# Patient Record
Sex: Male | Born: 1935 | Race: White | Hispanic: No | Marital: Married | Smoking: Former smoker
Health system: Southern US, Community
[De-identification: ages and names within clinical notes are randomized; demographics above are authoritative.]

## PROBLEM LIST (undated history)

## (undated) DIAGNOSIS — K2970 Gastritis, unspecified, without bleeding: Secondary | ICD-10-CM

## (undated) DIAGNOSIS — K209 Esophagitis, unspecified without bleeding: Secondary | ICD-10-CM

## (undated) DIAGNOSIS — M109 Gout, unspecified: Secondary | ICD-10-CM

## (undated) DIAGNOSIS — I739 Peripheral vascular disease, unspecified: Secondary | ICD-10-CM

## (undated) DIAGNOSIS — I10 Essential (primary) hypertension: Secondary | ICD-10-CM

## (undated) DIAGNOSIS — M67919 Unspecified disorder of synovium and tendon, unspecified shoulder: Secondary | ICD-10-CM

## (undated) DIAGNOSIS — R109 Unspecified abdominal pain: Secondary | ICD-10-CM

## (undated) DIAGNOSIS — E119 Type 2 diabetes mellitus without complications: Secondary | ICD-10-CM

## (undated) DIAGNOSIS — K279 Peptic ulcer, site unspecified, unspecified as acute or chronic, without hemorrhage or perforation: Secondary | ICD-10-CM

## (undated) DIAGNOSIS — N529 Male erectile dysfunction, unspecified: Secondary | ICD-10-CM

## (undated) DIAGNOSIS — F528 Other sexual dysfunction not due to a substance or known physiological condition: Secondary | ICD-10-CM

## (undated) DIAGNOSIS — M719 Bursopathy, unspecified: Secondary | ICD-10-CM

## (undated) DIAGNOSIS — N200 Calculus of kidney: Secondary | ICD-10-CM

## (undated) DIAGNOSIS — K2210 Ulcer of esophagus without bleeding: Secondary | ICD-10-CM

## (undated) DIAGNOSIS — K269 Duodenal ulcer, unspecified as acute or chronic, without hemorrhage or perforation: Secondary | ICD-10-CM

## (undated) DIAGNOSIS — C801 Malignant (primary) neoplasm, unspecified: Secondary | ICD-10-CM

## (undated) DIAGNOSIS — J309 Allergic rhinitis, unspecified: Principal | ICD-10-CM

## (undated) DIAGNOSIS — E785 Hyperlipidemia, unspecified: Secondary | ICD-10-CM

## (undated) DIAGNOSIS — M1990 Unspecified osteoarthritis, unspecified site: Secondary | ICD-10-CM

## (undated) DIAGNOSIS — K219 Gastro-esophageal reflux disease without esophagitis: Secondary | ICD-10-CM

## (undated) HISTORY — DX: Peripheral vascular disease, unspecified: I73.9

## (undated) HISTORY — DX: Essential (primary) hypertension: I10

## (undated) HISTORY — DX: Type 2 diabetes mellitus without complications: E11.9

## (undated) HISTORY — DX: Unspecified abdominal pain: R10.9

## (undated) HISTORY — DX: Male erectile dysfunction, unspecified: N52.9

## (undated) HISTORY — PX: COLONOSCOPY: SHX174

## (undated) HISTORY — DX: Bursopathy, unspecified: M71.9

## (undated) HISTORY — DX: Gout, unspecified: M10.9

## (undated) HISTORY — PX: ROTATOR CUFF REPAIR: SHX139

## (undated) HISTORY — DX: Other sexual dysfunction not due to a substance or known physiological condition: F52.8

## (undated) HISTORY — DX: Calculus of kidney: N20.0

## (undated) HISTORY — PX: TONSILLECTOMY: SUR1361

## (undated) HISTORY — PX: CYSTOSCOPY: SUR368

## (undated) HISTORY — PX: OTHER SURGICAL HISTORY: SHX169

## (undated) HISTORY — DX: Allergic rhinitis, unspecified: J30.9

## (undated) HISTORY — DX: Hyperlipidemia, unspecified: E78.5

## (undated) HISTORY — DX: Unspecified disorder of synovium and tendon, unspecified shoulder: M67.919

---

## 1998-12-15 DIAGNOSIS — C4492 Squamous cell carcinoma of skin, unspecified: Secondary | ICD-10-CM

## 1998-12-15 HISTORY — DX: Squamous cell carcinoma of skin, unspecified: C44.92

## 2003-01-29 ENCOUNTER — Encounter: Payer: Self-pay | Admitting: Orthopaedic Surgery

## 2003-01-29 ENCOUNTER — Encounter: Admission: RE | Admit: 2003-01-29 | Discharge: 2003-01-29 | Payer: Self-pay | Admitting: Orthopaedic Surgery

## 2003-02-14 ENCOUNTER — Ambulatory Visit (HOSPITAL_BASED_OUTPATIENT_CLINIC_OR_DEPARTMENT_OTHER): Admission: RE | Admit: 2003-02-14 | Discharge: 2003-02-14 | Payer: Self-pay | Admitting: Orthopaedic Surgery

## 2004-09-28 ENCOUNTER — Ambulatory Visit: Payer: Self-pay | Admitting: Physical Medicine & Rehabilitation

## 2004-09-28 ENCOUNTER — Inpatient Hospital Stay (HOSPITAL_COMMUNITY): Admission: RE | Admit: 2004-09-28 | Discharge: 2004-10-02 | Payer: Self-pay | Admitting: Orthopaedic Surgery

## 2004-11-03 ENCOUNTER — Ambulatory Visit: Payer: Self-pay | Admitting: Internal Medicine

## 2004-11-23 ENCOUNTER — Ambulatory Visit: Payer: Self-pay | Admitting: Internal Medicine

## 2005-06-30 DIAGNOSIS — C4492 Squamous cell carcinoma of skin, unspecified: Secondary | ICD-10-CM

## 2005-06-30 HISTORY — DX: Squamous cell carcinoma of skin, unspecified: C44.92

## 2005-10-17 ENCOUNTER — Inpatient Hospital Stay (HOSPITAL_COMMUNITY): Admission: EM | Admit: 2005-10-17 | Discharge: 2005-10-23 | Payer: Self-pay | Admitting: Emergency Medicine

## 2005-10-17 ENCOUNTER — Ambulatory Visit: Payer: Self-pay | Admitting: Physical Medicine & Rehabilitation

## 2006-02-22 ENCOUNTER — Ambulatory Visit: Payer: Self-pay | Admitting: Internal Medicine

## 2006-03-09 DIAGNOSIS — C4491 Basal cell carcinoma of skin, unspecified: Secondary | ICD-10-CM

## 2006-03-09 HISTORY — DX: Basal cell carcinoma of skin, unspecified: C44.91

## 2006-03-23 ENCOUNTER — Ambulatory Visit: Payer: Self-pay | Admitting: Internal Medicine

## 2006-09-27 ENCOUNTER — Ambulatory Visit: Payer: Self-pay | Admitting: Internal Medicine

## 2007-08-09 ENCOUNTER — Encounter: Payer: Self-pay | Admitting: Internal Medicine

## 2007-08-09 ENCOUNTER — Ambulatory Visit: Payer: Self-pay | Admitting: Internal Medicine

## 2007-08-09 DIAGNOSIS — M109 Gout, unspecified: Secondary | ICD-10-CM

## 2007-08-09 DIAGNOSIS — E785 Hyperlipidemia, unspecified: Secondary | ICD-10-CM

## 2007-08-09 DIAGNOSIS — E119 Type 2 diabetes mellitus without complications: Secondary | ICD-10-CM | POA: Insufficient documentation

## 2007-08-09 DIAGNOSIS — I10 Essential (primary) hypertension: Secondary | ICD-10-CM | POA: Insufficient documentation

## 2007-08-09 HISTORY — DX: Hyperlipidemia, unspecified: E78.5

## 2007-08-09 HISTORY — DX: Type 2 diabetes mellitus without complications: E11.9

## 2007-08-09 HISTORY — DX: Essential (primary) hypertension: I10

## 2007-08-09 HISTORY — DX: Gout, unspecified: M10.9

## 2007-08-09 LAB — CONVERTED CEMR LAB
ALT: 16 units/L (ref 0–53)
AST: 16 units/L (ref 0–37)
BUN: 23 mg/dL (ref 6–23)
Basophils Absolute: 0.1 10*3/uL (ref 0.0–0.1)
Basophils Relative: 1 % (ref 0.0–1.0)
CO2: 30 meq/L (ref 19–32)
Calcium: 10 mg/dL (ref 8.4–10.5)
Chloride: 106 meq/L (ref 96–112)
Cholesterol: 190 mg/dL (ref 0–200)
Creatinine, Ser: 1.1 mg/dL (ref 0.4–1.5)
Direct LDL: 104.3 mg/dL
Eosinophils Absolute: 0.6 10*3/uL (ref 0.0–0.6)
Eosinophils Relative: 8.2 % — ABNORMAL HIGH (ref 0.0–5.0)
Ferritin: 308.7 ng/mL (ref 22.0–322.0)
GFR calc Af Amer: 85 mL/min
GFR calc non Af Amer: 71 mL/min
Glucose, Bld: 148 mg/dL — ABNORMAL HIGH (ref 70–99)
HCT: 40.1 % (ref 39.0–52.0)
HDL: 54.2 mg/dL (ref >39.0)
Hemoglobin: 13.6 g/dL (ref 13.0–17.0)
Hgb A1c MFr Bld: 7 % — ABNORMAL HIGH (ref 4.6–6.0)
Iron: 77 ug/dL (ref 42–165)
LDL Cholesterol: 94 mg/dL (ref 0–99)
Lymphocytes Relative: 22.2 % (ref 12.0–46.0)
MCHC: 34 g/dL (ref 30.0–36.0)
MCV: 91.3 fL (ref 78.0–100.0)
Monocytes Absolute: 0.7 10*3/uL (ref 0.2–0.7)
Monocytes Relative: 9.6 % (ref 3.0–11.0)
Neutro Abs: 4.4 10*3/uL (ref 1.4–7.7)
Neutrophils Relative %: 59 % (ref 43.0–77.0)
PSA: 0.54 ng/mL (ref 0.10–4.00)
Platelets: 247 10*3/uL (ref 150–400)
Potassium: 5.8 meq/L — ABNORMAL HIGH (ref 3.5–5.1)
RBC: 4.39 M/uL (ref 4.22–5.81)
RDW: 12.6 % (ref 11.5–14.6)
Sodium: 139 meq/L (ref 135–145)
Total CHOL/HDL Ratio: 3.5
Triglycerides: 209 mg/dL (ref 0–149)
VLDL: 42 mg/dL — ABNORMAL HIGH (ref 0–40)
WBC: 7.4 10*3/uL (ref 4.5–10.5)

## 2007-08-14 ENCOUNTER — Telehealth: Payer: Self-pay | Admitting: Internal Medicine

## 2007-08-30 ENCOUNTER — Ambulatory Visit: Payer: Self-pay | Admitting: Internal Medicine

## 2007-08-30 LAB — CONVERTED CEMR LAB
ALT: 21 units/L (ref 0–53)
AST: 18 units/L (ref 0–37)
Albumin: 3.7 g/dL (ref 3.5–5.2)
Alkaline Phosphatase: 72 units/L (ref 39–117)
Amylase: 96 units/L (ref 27–131)
Basophils Absolute: 0 10*3/uL (ref 0.0–0.1)
Basophils Relative: 0.5 % (ref 0.0–1.0)
Bilirubin, Direct: 0.1 mg/dL (ref 0.0–0.3)
Eosinophils Absolute: 0.5 10*3/uL (ref 0.0–0.6)
Eosinophils Relative: 4.7 % (ref 0.0–5.0)
HCT: 37.7 % — ABNORMAL LOW (ref 39.0–52.0)
Hemoglobin: 12.9 g/dL — ABNORMAL LOW (ref 13.0–17.0)
Lipase: 123 units/L — ABNORMAL HIGH (ref 11.0–59.0)
Lymphocytes Relative: 25.2 % (ref 12.0–46.0)
MCHC: 34.1 g/dL (ref 30.0–36.0)
MCV: 89.8 fL (ref 78.0–100.0)
Monocytes Absolute: 0.9 10*3/uL — ABNORMAL HIGH (ref 0.2–0.7)
Monocytes Relative: 9.6 % (ref 3.0–11.0)
Neutro Abs: 5.9 10*3/uL (ref 1.4–7.7)
Neutrophils Relative %: 60 % (ref 43.0–77.0)
Platelets: 302 10*3/uL (ref 150–400)
RBC: 4.2 M/uL — ABNORMAL LOW (ref 4.22–5.81)
RDW: 13.2 % (ref 11.5–14.6)
Total Bilirubin: 0.5 mg/dL (ref 0.3–1.2)
Total Protein: 6.8 g/dL (ref 6.0–8.3)
WBC: 9.8 10*3/uL (ref 4.5–10.5)

## 2007-08-31 ENCOUNTER — Ambulatory Visit (HOSPITAL_COMMUNITY): Admission: RE | Admit: 2007-08-31 | Discharge: 2007-08-31 | Payer: Self-pay | Admitting: Internal Medicine

## 2007-10-06 ENCOUNTER — Ambulatory Visit: Payer: Self-pay | Admitting: Internal Medicine

## 2008-01-22 ENCOUNTER — Encounter: Payer: Self-pay | Admitting: Internal Medicine

## 2008-04-21 ENCOUNTER — Encounter: Payer: Self-pay | Admitting: Internal Medicine

## 2008-04-29 ENCOUNTER — Encounter: Payer: Self-pay | Admitting: Internal Medicine

## 2008-05-09 ENCOUNTER — Encounter: Payer: Self-pay | Admitting: Internal Medicine

## 2008-06-17 ENCOUNTER — Encounter: Admission: RE | Admit: 2008-06-17 | Discharge: 2008-06-17 | Payer: Self-pay | Admitting: Orthopaedic Surgery

## 2008-08-11 ENCOUNTER — Ambulatory Visit: Payer: Self-pay | Admitting: Internal Medicine

## 2008-08-11 DIAGNOSIS — M719 Bursopathy, unspecified: Secondary | ICD-10-CM

## 2008-08-11 DIAGNOSIS — M67919 Unspecified disorder of synovium and tendon, unspecified shoulder: Secondary | ICD-10-CM

## 2008-08-11 HISTORY — DX: Unspecified disorder of synovium and tendon, unspecified shoulder: M67.919

## 2008-08-11 LAB — CONVERTED CEMR LAB
ALT: 14 units/L (ref 0–53)
AST: 17 units/L (ref 0–37)
Albumin: 4.1 g/dL (ref 3.5–5.2)
Alkaline Phosphatase: 68 units/L (ref 39–117)
BUN: 32 mg/dL — ABNORMAL HIGH (ref 6–23)
Bilirubin, Direct: 0.1 mg/dL (ref 0.0–0.3)
CO2: 28 meq/L (ref 19–32)
Calcium: 9.5 mg/dL (ref 8.4–10.5)
Chloride: 112 meq/L (ref 96–112)
Cholesterol: 153 mg/dL (ref 0–200)
Creatinine, Ser: 1.3 mg/dL (ref 0.4–1.5)
Direct LDL: 92.5 mg/dL
GFR calc Af Amer: 70 mL/min
GFR calc non Af Amer: 58 mL/min
Glucose, Bld: 144 mg/dL — ABNORMAL HIGH (ref 70–99)
HDL: 46.9 mg/dL (ref >39.0)
Hgb A1c MFr Bld: 7.1 % — ABNORMAL HIGH (ref 4.6–6.0)
PSA: 0.5 ng/mL (ref 0.10–4.00)
Potassium: 5.8 meq/L — ABNORMAL HIGH (ref 3.5–5.1)
Sodium: 141 meq/L (ref 135–145)
Total Bilirubin: 0.7 mg/dL (ref 0.3–1.2)
Total CHOL/HDL Ratio: 3.3
Total Protein: 7.2 g/dL (ref 6.0–8.3)
Triglycerides: 225 mg/dL (ref 0–149)
Uric Acid, Serum: 7 mg/dL (ref 4.0–7.8)
VLDL: 45 mg/dL — ABNORMAL HIGH (ref 0–40)

## 2008-08-12 ENCOUNTER — Encounter: Payer: Self-pay | Admitting: Internal Medicine

## 2008-08-13 ENCOUNTER — Telehealth: Payer: Self-pay | Admitting: Internal Medicine

## 2008-10-10 ENCOUNTER — Ambulatory Visit: Payer: Self-pay | Admitting: Internal Medicine

## 2008-11-11 ENCOUNTER — Encounter: Payer: Self-pay | Admitting: Internal Medicine

## 2009-01-02 ENCOUNTER — Encounter: Payer: Self-pay | Admitting: Internal Medicine

## 2009-02-10 ENCOUNTER — Ambulatory Visit: Payer: Self-pay | Admitting: Internal Medicine

## 2009-02-10 DIAGNOSIS — F528 Other sexual dysfunction not due to a substance or known physiological condition: Secondary | ICD-10-CM

## 2009-02-10 HISTORY — DX: Other sexual dysfunction not due to a substance or known physiological condition: F52.8

## 2009-02-10 LAB — CONVERTED CEMR LAB
BUN: 19 mg/dL (ref 6–23)
CO2: 28 meq/L (ref 19–32)
Calcium: 9.3 mg/dL (ref 8.4–10.5)
Chloride: 105 meq/L (ref 96–112)
Creatinine, Ser: 0.9 mg/dL (ref 0.4–1.5)
GFR calc Af Amer: 107 mL/min
GFR calc non Af Amer: 89 mL/min
Glucose, Bld: 113 mg/dL — ABNORMAL HIGH (ref 70–99)
Hgb A1c MFr Bld: 6.8 % — ABNORMAL HIGH (ref 4.6–6.0)
Potassium: 4.5 meq/L (ref 3.5–5.1)
Sodium: 139 meq/L (ref 135–145)
Testosterone: 244.98 ng/dL — ABNORMAL LOW (ref 350.00–890.00)

## 2009-02-11 ENCOUNTER — Telehealth: Payer: Self-pay | Admitting: Internal Medicine

## 2009-03-20 ENCOUNTER — Ambulatory Visit: Payer: Self-pay | Admitting: Internal Medicine

## 2009-03-23 ENCOUNTER — Ambulatory Visit: Payer: Self-pay | Admitting: Internal Medicine

## 2009-08-12 ENCOUNTER — Ambulatory Visit: Payer: Self-pay | Admitting: Internal Medicine

## 2009-08-12 ENCOUNTER — Telehealth: Payer: Self-pay | Admitting: Internal Medicine

## 2009-08-12 DIAGNOSIS — I739 Peripheral vascular disease, unspecified: Secondary | ICD-10-CM

## 2009-08-12 HISTORY — DX: Peripheral vascular disease, unspecified: I73.9

## 2009-08-12 LAB — CONVERTED CEMR LAB
ALT: 23 units/L (ref 0–53)
AST: 26 units/L (ref 0–37)
Albumin: 4.2 g/dL (ref 3.5–5.2)
Alkaline Phosphatase: 73 units/L (ref 39–117)
BUN: 29 mg/dL — ABNORMAL HIGH (ref 6–23)
Bilirubin, Direct: 0.1 mg/dL (ref 0.0–0.3)
CO2: 29 meq/L (ref 19–32)
Calcium: 9.8 mg/dL (ref 8.4–10.5)
Chloride: 103 meq/L (ref 96–112)
Cholesterol: 168 mg/dL (ref 0–200)
Creatinine, Ser: 1.3 mg/dL (ref 0.4–1.5)
GFR calc non Af Amer: 57.8 mL/min (ref >60)
Glucose, Bld: 127 mg/dL — ABNORMAL HIGH (ref 70–99)
HDL: 53.2 mg/dL (ref >39.00)
Hgb A1c MFr Bld: 6.9 % — ABNORMAL HIGH (ref 4.6–6.5)
LDL Cholesterol: 96 mg/dL (ref 0–99)
PSA: 0.66 ng/mL (ref 0.10–4.00)
Potassium: 6.2 meq/L (ref 3.5–5.1)
Sodium: 138 meq/L (ref 135–145)
Total Bilirubin: 1.1 mg/dL (ref 0.3–1.2)
Total CHOL/HDL Ratio: 3
Total Protein: 7.3 g/dL (ref 6.0–8.3)
Triglycerides: 95 mg/dL (ref 0.0–149.0)
Uric Acid, Serum: 8.8 mg/dL — ABNORMAL HIGH (ref 4.0–7.8)
VLDL: 19 mg/dL (ref 0.0–40.0)
Vit D, 25-Hydroxy: 27 ng/mL — ABNORMAL LOW (ref 30–89)

## 2009-08-18 ENCOUNTER — Ambulatory Visit: Payer: Self-pay

## 2009-08-18 ENCOUNTER — Encounter: Payer: Self-pay | Admitting: Internal Medicine

## 2009-08-24 ENCOUNTER — Encounter: Payer: Self-pay | Admitting: Internal Medicine

## 2009-10-01 ENCOUNTER — Telehealth: Payer: Self-pay | Admitting: Internal Medicine

## 2009-10-14 ENCOUNTER — Telehealth (INDEPENDENT_AMBULATORY_CARE_PROVIDER_SITE_OTHER): Payer: Self-pay | Admitting: *Deleted

## 2009-10-26 ENCOUNTER — Ambulatory Visit: Payer: Self-pay | Admitting: Internal Medicine

## 2009-10-26 ENCOUNTER — Ambulatory Visit: Payer: Self-pay | Admitting: Cardiology

## 2009-10-28 ENCOUNTER — Telehealth: Payer: Self-pay | Admitting: Internal Medicine

## 2009-11-19 ENCOUNTER — Ambulatory Visit: Payer: Self-pay | Admitting: Internal Medicine

## 2009-12-14 ENCOUNTER — Ambulatory Visit: Payer: Self-pay | Admitting: Internal Medicine

## 2009-12-18 ENCOUNTER — Telehealth: Payer: Self-pay | Admitting: Internal Medicine

## 2010-01-25 ENCOUNTER — Telehealth: Payer: Self-pay | Admitting: Internal Medicine

## 2010-01-25 ENCOUNTER — Encounter: Payer: Self-pay | Admitting: Internal Medicine

## 2010-01-26 ENCOUNTER — Telehealth: Payer: Self-pay | Admitting: Internal Medicine

## 2010-02-08 ENCOUNTER — Telehealth: Payer: Self-pay | Admitting: Internal Medicine

## 2010-02-15 DIAGNOSIS — C4491 Basal cell carcinoma of skin, unspecified: Secondary | ICD-10-CM

## 2010-02-15 HISTORY — DX: Basal cell carcinoma of skin, unspecified: C44.91

## 2010-03-10 ENCOUNTER — Telehealth: Payer: Self-pay | Admitting: Internal Medicine

## 2010-04-23 ENCOUNTER — Telehealth: Payer: Self-pay | Admitting: Internal Medicine

## 2010-04-28 ENCOUNTER — Ambulatory Visit: Payer: Self-pay | Admitting: Internal Medicine

## 2010-04-28 ENCOUNTER — Telehealth: Payer: Self-pay | Admitting: Internal Medicine

## 2010-04-29 LAB — CONVERTED CEMR LAB
ALT: 17 units/L (ref 0–53)
AST: 20 units/L (ref 0–37)
Albumin: 4.2 g/dL (ref 3.5–5.2)
Alkaline Phosphatase: 81 units/L (ref 39–117)
BUN: 26 mg/dL — ABNORMAL HIGH (ref 6–23)
Basophils Absolute: 0.1 10*3/uL (ref 0.0–0.1)
Basophils Relative: 0.5 % (ref 0.0–3.0)
Bilirubin, Direct: 0.1 mg/dL (ref 0.0–0.3)
CO2: 32 meq/L (ref 19–32)
Calcium: 9.4 mg/dL (ref 8.4–10.5)
Chloride: 97 meq/L (ref 96–112)
Cholesterol: 193 mg/dL (ref 0–200)
Creatinine, Ser: 1.4 mg/dL (ref 0.4–1.5)
Eosinophils Absolute: 1 10*3/uL — ABNORMAL HIGH (ref 0.0–0.7)
Eosinophils Relative: 8.3 % — ABNORMAL HIGH (ref 0.0–5.0)
GFR calc non Af Amer: 55.22 mL/min (ref >60)
Glucose, Bld: 124 mg/dL — ABNORMAL HIGH (ref 70–99)
HCT: 46.1 % (ref 39.0–52.0)
HDL: 53 mg/dL (ref >39.00)
Hemoglobin: 15.7 g/dL (ref 13.0–17.0)
Hgb A1c MFr Bld: 6.6 % — ABNORMAL HIGH (ref 4.6–6.5)
LDL Cholesterol: 103 mg/dL — ABNORMAL HIGH (ref 0–99)
Lymphocytes Relative: 14.1 % (ref 12.0–46.0)
Lymphs Abs: 1.7 10*3/uL (ref 0.7–4.0)
MCHC: 34 g/dL (ref 30.0–36.0)
MCV: 91.7 fL (ref 78.0–100.0)
Monocytes Absolute: 1.1 10*3/uL — ABNORMAL HIGH (ref 0.1–1.0)
Monocytes Relative: 9.4 % (ref 3.0–12.0)
Neutro Abs: 8.2 10*3/uL — ABNORMAL HIGH (ref 1.4–7.7)
Neutrophils Relative %: 67.7 % (ref 43.0–77.0)
Platelets: 249 10*3/uL (ref 150.0–400.0)
Potassium: 4.7 meq/L (ref 3.5–5.1)
RBC: 5.03 M/uL (ref 4.22–5.81)
RDW: 13.7 % (ref 11.5–14.6)
Sodium: 138 meq/L (ref 135–145)
Total Bilirubin: 0.7 mg/dL (ref 0.3–1.2)
Total CHOL/HDL Ratio: 4
Total Protein: 6.9 g/dL (ref 6.0–8.3)
Triglycerides: 185 mg/dL — ABNORMAL HIGH (ref 0.0–149.0)
Uric Acid, Serum: 9.7 mg/dL — ABNORMAL HIGH (ref 4.0–7.8)
VLDL: 37 mg/dL (ref 0.0–40.0)
WBC: 12 10*3/uL — ABNORMAL HIGH (ref 4.5–10.5)

## 2010-05-02 ENCOUNTER — Encounter: Payer: Self-pay | Admitting: Internal Medicine

## 2010-05-11 ENCOUNTER — Telehealth: Payer: Self-pay | Admitting: Internal Medicine

## 2010-05-19 ENCOUNTER — Telehealth: Payer: Self-pay | Admitting: Internal Medicine

## 2010-07-05 ENCOUNTER — Ambulatory Visit: Payer: Self-pay | Admitting: Internal Medicine

## 2010-07-05 DIAGNOSIS — N200 Calculus of kidney: Secondary | ICD-10-CM | POA: Insufficient documentation

## 2010-07-05 DIAGNOSIS — R109 Unspecified abdominal pain: Secondary | ICD-10-CM | POA: Insufficient documentation

## 2010-07-05 HISTORY — DX: Calculus of kidney: N20.0

## 2010-07-05 HISTORY — DX: Unspecified abdominal pain: R10.9

## 2010-07-05 LAB — CONVERTED CEMR LAB
Bilirubin Urine: NEGATIVE
Hemoglobin, Urine: NEGATIVE
Ketones, ur: NEGATIVE mg/dL
Nitrite: NEGATIVE
Specific Gravity, Urine: 1.02 (ref 1.000–1.030)
Total Protein, Urine: NEGATIVE mg/dL
Urine Glucose: NEGATIVE mg/dL
Urobilinogen, UA: 0.2 (ref 0.0–1.0)
pH: 5 (ref 5.0–8.0)

## 2010-07-22 ENCOUNTER — Telehealth: Payer: Self-pay | Admitting: Internal Medicine

## 2010-09-10 ENCOUNTER — Telehealth (INDEPENDENT_AMBULATORY_CARE_PROVIDER_SITE_OTHER): Payer: Self-pay | Admitting: *Deleted

## 2010-11-17 ENCOUNTER — Encounter
Admission: RE | Admit: 2010-11-17 | Discharge: 2010-11-17 | Payer: Self-pay | Source: Home / Self Care | Admitting: Orthopaedic Surgery

## 2010-11-18 ENCOUNTER — Encounter: Payer: Self-pay | Admitting: Internal Medicine

## 2011-01-11 NOTE — Progress Notes (Signed)
  Phone Note Refill Request Message from:  Fax from Pharmacy on March 10, 2010 1:46 PM  Refills Requested: Medication #1:  FUROSEMIDE 40 MG TABS 1 by mouth once daily.   Last Refilled: 02/03/2010 Initial call taken by: Rock Nephew CMA,  March 10, 2010 1:46 PM    Prescriptions: FUROSEMIDE 40 MG TABS (FUROSEMIDE) 1 by mouth once daily  #30 x 5   Entered by:   Rock Nephew CMA   Authorized by:   Jacques Navy MD   Signed by:   Rock Nephew CMA on 03/10/2010   Method used:   [facility]        [facility] [phone][facility] (retail)       [address]       [place], [place]  [address]       Ph: [phone]       Fax: [phone]   RxID:   [id number]

## 2011-01-11 NOTE — Progress Notes (Signed)
  Phone Note Refill Request Message from:  Fax from Pharmacy on December 18, 2009 9:09 AM  Refills Requested: Medication #1:  METFORMIN HCL 1000 MG  TABS two times a day Initial call taken by: Ami Bullins CMA,  December 18, 2009 9:09 AM    Prescriptions: METFORMIN HCL 1000 MG  TABS (METFORMIN HCL) two times a day  #60 Tablet x 5   Entered by:   Ami Bullins CMA   Authorized by:   Jacques Navy MD   Signed by:   Bill Salinas CMA on 12/18/2009   Method used:   [facility]        [id number] ([facility]       [address]       [place], [place]  [address]       Ph: [phone]       Fax: [phone]   RxID:   [id number]

## 2011-01-11 NOTE — Letter (Signed)
Summary: BP Lists from pt/[facility]  BP Lists from pt/[facility]   Imported By: [place] 01/26/2010 10:45:36  _____________________________________________________________________  External Attachment:    Type:   Image     Comment:   External Document

## 2011-01-11 NOTE — Progress Notes (Signed)
Summary: Test strip refill  Phone Note Refill Request Message from:  Pharmacy on January 26, 2010 2:33 PM  Refills Requested: Medication #1:  ONETOUCH ULTRA TEST   STRP Check blood sugar once a day Initial call taken by: Lucious Groves,  January 26, 2010 2:33 PM    Prescriptions: [place] TEST   STRP (GLUCOSE BLOOD) Check blood sugar once a day  #50 x 5   Entered by:   Lucious Groves   Authorized by:   Jacques Navy MD   Signed by:   Lucious Groves on 01/26/2010   Method used:   [facility]        [facility] [address] [phone]* (retail)       [address]       [place], [place]  [address]       Ph: [phone]       Fax: [phone]   RxID:   [id number]
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
## 2011-01-11 NOTE — Letter (Signed)
[name] [address] [place], [place]  [address] Phone: [phone]      May 03, 2010   [name] [address] [place], [place] [address]  RE:  LAB RESULTS  Dear  [name],  The following is an interpretation of your most recent lab tests.  Please take note of any instructions provided or changes to medications that have resulted from your lab work.  ELECTROLYTES:  Good - no changes needed  KIDNEY FUNCTION TESTS:  Good - no changes needed  LIVER FUNCTION TESTS:  Good - no changes needed  Health professionals look at cholesterol as more involved than just the total cholesterol. We consider the level of LDL (bad) cholesterol, HDL (good), cholesterol, and Triglycerides (Grease) in the blood.  1. Your LDL should be under 100, and the HDL should be over 45, if you have any vascular disease such as heart attack, angina, stroke, TIA (mini stroke), claudication (pain in the legs when you walk due to poor circulation),  Abdominal Aortic Aneurysm (AAA), diabetes or prediabetes.  2. Your LDL should be under 130 if you have any two of the following:     a. Smoke or chew tobacco,     b. High blood pressure (if you are on medication or over 140/90 without medication),     c. Male gender,    d. HDL below 40,    e. A male relative (father, brother, or son), who have had any vascular event          as described in #1. above under the age of 40, or a male relative (mother,       sister, or daughter) who had an event as described above under age 73. (An HDL over 60 will subtract one risk factor from the total, so if you have two items in # 2 above, but an HDL over 60, you then fall into category # 3 below).  3. Your LDL should be under 160 if you have any one of the above.  Triglycerides should be under 200 with the ideal being under 150.  For diabetes or pre-diabetes, the ideal HgbA1C should be under 6.0%.  If you fall into any of the above  categories, you should make a follow up appointment to discuss this with your physician.  LIPID PANEL:  Good - no changes needed Triglyceride: 185.0   Cholesterol: 193   LDL: 103   HDL: 53.00   Chol/HDL%:  4  DIABETIC STUDIES:  Good - no changes needed Blood Glucose: 124   HgbA1C: 6.6     CBC:  Good - no changes needed Uric acid level is high @ 9.7 putting you at risk for gout   Call or e-mail me if you have questions ([email or web address]).   Sincerely Yours,    Jacques Navy MD  Patient: [name] Note: All result statuses are Final unless otherwise noted.  Tests: (1) BMP (METABOL)   Sodium                    138 mEq/L                   135-145   Potassium                 4.7 mEq/L                   3.5-5.1   Chloride  97 mEq/L                    96-112   Carbon Dioxide            32 mEq/L                    19-32   Glucose              [H]  124 mg/dL                   16-10   BUN                  [H]  26 mg/dL                    9-60   Creatinine                1.4 mg/dL                   4.5-4.0   Calcium                   9.4 mg/dL                   9.8-11.9   GFR                       55.22 mL/min                >60  Tests: (2) Lipid Panel (LIPID)   Cholesterol               193 mg/dL                   1-478     ATP III Classification            Desirable:  < 200 mg/dL                    Borderline High:  200 - 239 mg/dL               High:  > = 240 mg/dL   Triglycerides        [H]  185.0 mg/dL                 2.9-562.1     Normal:  <150 mg/dL     Borderline High:  308 - 199 mg/dL   HDL                       65.78 mg/dL                 >46.96   VLDL Cholesterol          37.0 mg/dL                  2.9-52.8   LDL Cholesterol      [H]  413 mg/dL                   2-44  CHO/HDL Ratio:  CHD Risk                             4                    Men  Women     1/2 Average Risk     3.4          3.3     Average Risk          5.0           4.4     2X Average Risk          9.6          7.1     3X Average Risk          15.0          11.0                           Tests: (3) Hepatic/Liver Function Panel (HEPATIC)   Total Bilirubin           0.7 mg/dL                   5.2-8.4   Direct Bilirubin          0.1 mg/dL                   1.3-2.4   Alkaline Phosphatase      81 U/L                      39-117   AST                       20 U/L                      0-37   ALT                       17 U/L                      0-53   Total Protein             6.9 g/dL                    4.0-1.0   Albumin                   4.2 g/dL                    2.7-2.5  Tests: (4) Hemoglobin A1C (A1C)   Hemoglobin A1C       [H]  6.6 %                       4.6-6.5     Glycemic Control Guidelines for People with Diabetes:     Non Diabetic:  <6%     Goal of Therapy: <7%     Additional Action Suggested:  >8%   Tests: (5) Uric Acid (URIC)   Uric Acid            [H]  9.7 mg/dL                   3.6-6.4  Tests: (6) CBC Platelet w/Diff (CBCD)   White Cell Count     [H]  12.0 K/uL                   4.5-10.5   Red Cell Count            5.03 Mil/uL  4.22-5.81   Hemoglobin                15.7 g/dL                   64.4-03.4   Hematocrit                46.1 %                      39.0-52.0   MCV                       91.7 fl                     78.0-100.0   MCHC                      34.0 g/dL                   74.2-59.5   RDW                       13.7 %                      11.5-14.6   Platelet Count            249.0 K/uL                  150.0-400.0   Neutrophil %              67.7 %                      43.0-77.0   Lymphocyte %              14.1 %                      12.0-46.0   Monocyte %                9.4 %                       3.0-12.0   Eosinophils%         [H]  8.3 %                       0.0-5.0   Basophils %               0.5 %                       0.0-3.0   Neutrophill Absolute [H]  8.2 K/uL                     1.4-7.7   Lymphocyte Absolute       1.7 K/uL                    0.7-4.0   Monocyte Absolute    [H]  1.1 K/uL                    0.1-1.0  Eosinophils, Absolute                        [H]  1.0 K/uL  0.0-0.7   Basophils Absolute        0.1 K/uL                    0.0-0.1

## 2011-01-11 NOTE — Progress Notes (Signed)
  Phone Note Refill Request Message from:  Fax from Pharmacy on February 08, 2010 9:28 AM  Refills Requested: Medication #1:  FUROSEMIDE 40 MG TABS 1 by mouth once daily. Initial call taken by: Ami Bullins CMA,  February 08, 2010 9:28 AM    Prescriptions: FUROSEMIDE 40 MG TABS (FUROSEMIDE) 1 by mouth once daily  #30 x 2   Entered by:   Ami Bullins CMA   Authorized by:   Jacques Navy MD   Signed by:   Bill Salinas CMA on 02/08/2010   Method used:   [facility]        [facility] [address] [id number]       [address]       [place], [place]  [address]       Ph: [phone]       Fax: [phone]   RxID:   [id number]

## 2011-01-11 NOTE — Progress Notes (Signed)
Summary: MEDICATION REACTION  Phone Note Call from Patient   Summary of Call: Pt has been on labetalol for many years. Mid Feb, dose was increased to 2 three times a day. He has always had a "funny" feeling in his throat after taking the medication. Since the increase he c/o redness and tightness in his throat that gets progressively worse over the day. No sob or  difficulty breathing but does have trouble swallowing. Dr Debby Bud aware & advises pt to stop medication now and f/u this week. Pt is very worried that BP will become too elevated. He does not want to wait until tomorrow for office visit. Scheduled for apt today to discuss change in BP medication. Added Med to Allergy list and removed from EMR.  Initial call taken by: Lamar Sprinkles, CMA,  Apr 28, 2010 8:57 AM  Follow-up for Phone Call        seen in office today Follow-up by: Jacques Navy MD,  Apr 28, 2010 12:57 PM   New Allergies: ! LABETALOL HCL (LABETALOL HCL) New Allergies: ! LABETALOL HCL (LABETALOL HCL)

## 2011-01-11 NOTE — Progress Notes (Signed)
  Phone Note From Pharmacy   [id number] Reason for Call: Needs renewal Initial call taken by: Rock Nephew CMA,  July 22, 2010 9:42 AM    Prescriptions: TESTOSTERONE CYPIONATE 200 MG/ML OIL (TESTOSTERONE CYPIONATE) 2cc IM monthly for testosterone replacement  #10 cc x 3   Entered by:   Rock Nephew CMA   Authorized by:   Jacques Navy MD   Signed by:   Rock Nephew CMA on 07/22/2010   Method used:   Telephoned to ...       [facility] [address] [id number]       [address]       [place], [place]  [address]       Ph: [phone]       Fax: [phone]   RxID:   [id number]

## 2011-01-11 NOTE — Progress Notes (Signed)
Summary: BP MED PROBLEM  Phone Note Call from Patient Call back at [facility] Phone [phone]   Summary of Call: Pt recently d/c'd bp medication due to increase in dose causing increased throat tightness/swelling. He was changed from labetalol to amlodipine. Pt c/o stomach upset and feels like a "lump" in his throat. He remembers many years ago being put on Nexium due to same symptoms. BP is undercontrol and he wants to try nexium or something like that again.  Initial call taken by: Lamar Sprinkles, CMA,  May 19, 2010 10:02 AM  Follow-up for Phone Call         If still having symptoms - hold Lisinopril. Keep return office visit w/Dr Debby Bud OK to try Nexium Follow-up by: Tresa Garter MD,  May 19, 2010 5:40 PM  Additional Follow-up for Phone Call Additional follow up Details #1::        LMOVM for pt to call back.Marland KitchenMarland KitchenMarland KitchenAlvy Beal Archie CMA  May 20, 2010 10:44 AM   Spoke w/patient, he does NOT want to stop lisinopril. HE will stop med and call office  if nexium does not help or if symptoms change.  Additional Follow-up by: Lamar Sprinkles, CMA,  May 20, 2010 5:56 PM    New/Updated Medications: NEXIUM 40 MG CPDR (ESOMEPRAZOLE MAGNESIUM) 1 by mouth qam ( medically necessary) Prescriptions: NEXIUM 40 MG CPDR (ESOMEPRAZOLE MAGNESIUM) 1 by mouth qam ( medically necessary)  #30 x 3   Entered and Authorized by:   Tresa Garter MD   Signed by:   Jacques Navy MD on 05/19/2010   Method used:   [facility]ronically to        [facility] [address] [phone] ([facility]ail)       [address]       [place], [place]  [address]       Ph: [phone]       Fax: [phone]   RxID:   [id number]

## 2011-01-11 NOTE — Progress Notes (Signed)
Summary: REFILL  Phone Note Call from Patient   Summary of Call: Pt dropped of BP readings, per Dr continued present regimen. Pt aware. He says he is taking Labetalol 2 three times a day, Ok to update EMR and [facility]?  Initial call taken by: Lamar Sprinkles, CMA,  January 25, 2010 9:32 AM  Follow-up for Phone Call        changed med list and eScribed Rx: labetolol 300mg  2 caps three times a day = 180/month Follow-up by: Jacques Navy MD,  January 25, 2010 12:52 PM    New/Updated Medications: LABETALOL HCL 300 MG TABS (LABETALOL HCL) 2 by mouth three times a day Prescriptions: LABETALOL HCL 300 MG TABS (LABETALOL HCL) 2 by mouth three times a day  #180 x 12   Entered and Authorized by:   Jacques Navy MD   Signed by:   Jacques Navy MD on 01/25/2010   Method used:   Electronically to        [facility] [address] [phone]* (retail)       [address]       [place], [place]  [address]       Ph: [phone]       Fax: [phone]   RxID:   [id number]

## 2011-01-11 NOTE — Assessment & Plan Note (Signed)
Summary: elev bp 170/100/cd   Vital Signs:  Patient profile:   75 year old male Height:      78 inches Weight:      229 pounds BMI:     26.56 O2 Sat:      98 % on Room air Temp:     98.0 degrees F oral Pulse rate:   65 / minute BP sitting:   182 / 102  (left arm) Cuff size:   large  Vitals Entered By: Ami Bullins CMA (December 14, 2009 9:22 AM)  O2 Flow:  Room air CC: pt here for evaluation of elevated BP/ ab   CC:  pt here for evaluation of elevated BP/ ab.  Current Medications (verified): 1)  Labetalol Hcl 300 Mg Tabs (Labetalol Hcl) .Marland Kitchen.. 1 By Mouth Three Times A Day 2)  Metformin Hcl 1000 Mg  Tabs (Metformin Hcl) .... Two Times A Day 3)  Lipitor 20 Mg  Tabs (Atorvastatin Calcium) .... Take Every Pm 4)  Lisinopril 20 Mg  Tabs (Lisinopril) .... Once Daily 5)  Zantac 75 75 Mg  Tabs (Ranitidine Hcl) .... As Needed 6)  Onetouch Ultra Test   Strp (Glucose Blood) .... Check Blood Sugar Once A Day 7)  Testosterone Cypionate 200 Mg/ml Oil (Testosterone Cypionate) .... 2cc Im Monthly For Testosterone Replacement 8)  3 Ml Syringe With 22 G 1 Inch Needle .... Use Twice A Week With Testosterone 9)  Clonidine Hcl 0.1 Mg Tabs (Clonidine Hcl) .Marland Kitchen.. 1 By Mouth Q 4 As Needed Sbp >180, Dbp >100  Allergies (verified): No Known Drug Allergies   Complete Medication List: 1)  Labetalol Hcl 300 Mg Tabs (Labetalol hcl) .Marland Kitchen.. 1 by mouth three times a day 2)  Metformin Hcl 1000 Mg Tabs (Metformin hcl) .... Two times a day 3)  Lipitor 20 Mg Tabs (Atorvastatin calcium) .... Take every pm 4)  Lisinopril 20 Mg Tabs (Lisinopril) .... Once daily 5)  Zantac 75 75 Mg Tabs (Ranitidine hcl) .... As needed 6)  Onetouch Ultra Test Strp (Glucose blood) .... Check blood sugar once a day 7)  Testosterone Cypionate 200 Mg/ml Oil (Testosterone cypionate) .... 2cc im monthly for testosterone replacement 8)  3 Ml Syringe With 22 G 1 Inch Needle  .... Use twice a week with testosterone 9)  Clonidine Hcl 0.1 Mg Tabs  (Clonidine hcl) .Marland Kitchen.. 1 by mouth q 4 as needed sbp >180, dbp >100 10)  Furosemide 40 Mg Tabs (Furosemide) .Marland Kitchen.. 1 by mouth once daily Prescriptions: LABETALOL HCL 300 MG TABS (LABETALOL HCL) 1 by mouth three times a day  #90 x 12   Entered and Authorized by:   Jacques Navy MD   Signed by:   Jacques Navy MD on 12/14/2009   Method used:   Electronically to        [facility] [address] [phone]* (retail)       [address]       [place], [place]  [address]       Ph: [phone]       Fax: [phone]   RxID:   [id number] FUROSEMIDE 40 MG TABS (FUROSEMIDE) 1 by mouth once daily  #30 x 2   Entered and Authorized by:   Jacques Navy MD   Signed by:   Jacques Navy MD on 12/14/2009   Method used:   [facility]ronically to        [facility]  [address] [id number] ([facility]ail)       [address]       [place], [place]  [address]       Ph: [phone]       Fax: [phone]   RxID:   [id number]   Appended Document: elev bp 170/100/cd patient presents for contined problems with elevation of his BP. He has ruled out for RAS by CT-angio. He has no metabolic abnormality.  PHM/FH/SOC reviewed and without change  PE: vitals are stable        HEENT - no abnormality noted        Chest Clear        Cor-RRR without murmur  A/P  will continue present medications        add Furosemide 40mg  once daily        Monitor BP at home and report back.
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
## 2011-01-11 NOTE — Progress Notes (Signed)
Summary: Viagra refill  Phone Note From Pharmacy   Caller: [facility]  [address] [id number] Summary of Call: Received fax requesting refill for Viagra. Please advise. Initial call taken by: Lucious Groves,  Apr 23, 2010 4:25 PM  Follow-up for Phone Call        ok to refill #6 with as needed refills Follow-up by: Jacques Navy MD,  Apr 26, 2010 5:01 AM  Additional Follow-up for Phone Call Additional follow up Details #1::        [facility] Additional Follow-up by: Orlan Leavens,  Apr 26, 2010 9:19 AM    New/Updated Medications: VIAGRA 100 MG TABS (SILDENAFIL CITRATE) take as needed Prescriptions: VIAGRA 100 MG TABS (SILDENAFIL CITRATE) take as needed  #6 x 5   Entered by:   Orlan Leavens   Authorized by:   Jacques Navy MD   Signed by:   Orlan Leavens on 04/26/2010   Method used:   [facility]ronically to        [facility]  [address] [phone][facility] (retail)       [address]       [place], [place]  [address]       Ph: [phone]       Fax: [phone]   RxID:   [id number]

## 2011-01-11 NOTE — Progress Notes (Signed)
Summary: REQ FOR NEW RX  Phone Note Call from Patient Call back at Home Phone [phone]   Summary of Call: Pt says is BP medication is working "real good" but wants rx for increase to 10mg . OK?  Initial call taken by: Lamar Sprinkles, CMA,  May 11, 2010 10:34 AM  Follow-up for Phone Call        OK for amlodipine 10mg  once daily, #30, as needed refills Follow-up by: Jacques Navy MD,  May 11, 2010 10:44 AM  Additional Follow-up for Phone Call Additional follow up Details #1::        Pt's # busy................Marland KitchenLamar Sprinkles, CMA  May 11, 2010 2:34 PM   left mess for pt to check w/pharmacy Additional Follow-up by: Lamar Sprinkles, CMA,  May 11, 2010 3:55 PM    New/Updated Medications: AMLODIPINE BESYLATE 10 MG TABS (AMLODIPINE BESYLATE) 1 by mouth once daily Prescriptions: AMLODIPINE BESYLATE 10 MG TABS (AMLODIPINE BESYLATE) 1 by mouth once daily  #90 x 3   Entered by:   Lamar Sprinkles, CMA   Authorized by:   Jacques Navy MD   Signed by:   Lamar Sprinkles, CMA on 05/11/2010   Method used:   [facility]        [facility]  [address] [phone][facility] (retail)       [address]       [place], [place]  [address]       Ph: [phone]       Fax: [phone]   RxID:   [id number]

## 2011-01-11 NOTE — Progress Notes (Signed)
       Diabetes Management Exam:    Eye Exam:       Eye Exam done elsewhere          Date: 08/25/2010          Results: normal          Done by: Dr Jearld Adjutant

## 2011-01-27 NOTE — Letter (Signed)
Summary: Email from pt's daughter  Email from pt's daughter   Imported By: Sherian Rein 01/17/2011 08:45:18  _____________________________________________________________________  External Attachment:    Type:   Image     Comment:   External Document

## 2011-03-17 ENCOUNTER — Other Ambulatory Visit: Payer: Self-pay | Admitting: Internal Medicine

## 2011-03-21 ENCOUNTER — Telehealth: Payer: Self-pay | Admitting: *Deleted

## 2011-03-21 NOTE — Telephone Encounter (Signed)
Fax from [facility] [address] for Testosterone CYP 200 MG/ML QTY 10.0ML SIG inject 2cc IM monthly for testosterone replacement. Last filled 11/10/2010. Please Advise refills

## 2011-03-21 NOTE — Telephone Encounter (Signed)
Ok to refill 

## 2011-03-22 ENCOUNTER — Telehealth: Payer: Self-pay | Admitting: Internal Medicine

## 2011-03-22 MED ORDER — TESTOSTERONE CYPIONATE 200 MG/ML IM SOLN: 200.0000 mg | INTRAMUSCULAR | Status: DC

## 2011-03-22 NOTE — Telephone Encounter (Signed)
Ok for multi-dose  Vial as before. Refill x 1

## 2011-03-22 NOTE — Telephone Encounter (Signed)
Done

## 2011-03-22 NOTE — Telephone Encounter (Signed)
Pt needs new prescription for testosterone, pt would like faxed to [facility]. Please advise.

## 2011-04-06 ENCOUNTER — Other Ambulatory Visit: Payer: Self-pay | Admitting: Internal Medicine

## 2011-04-15 ENCOUNTER — Other Ambulatory Visit (INDEPENDENT_AMBULATORY_CARE_PROVIDER_SITE_OTHER): Payer: Medicare Other

## 2011-04-15 ENCOUNTER — Ambulatory Visit (INDEPENDENT_AMBULATORY_CARE_PROVIDER_SITE_OTHER): Payer: Medicare Other | Admitting: Internal Medicine

## 2011-04-15 ENCOUNTER — Encounter: Payer: Self-pay | Admitting: Internal Medicine

## 2011-04-15 ENCOUNTER — Other Ambulatory Visit (INDEPENDENT_AMBULATORY_CARE_PROVIDER_SITE_OTHER): Payer: Self-pay | Admitting: Internal Medicine

## 2011-04-15 VITALS — BP 136/78 | HR 86 | Temp 98.7°F | Wt 222.0 lb

## 2011-04-15 DIAGNOSIS — I10 Essential (primary) hypertension: Secondary | ICD-10-CM

## 2011-04-15 DIAGNOSIS — Z125 Encounter for screening for malignant neoplasm of prostate: Secondary | ICD-10-CM

## 2011-04-15 DIAGNOSIS — E119 Type 2 diabetes mellitus without complications: Secondary | ICD-10-CM

## 2011-04-15 DIAGNOSIS — E785 Hyperlipidemia, unspecified: Secondary | ICD-10-CM

## 2011-04-15 DIAGNOSIS — M109 Gout, unspecified: Secondary | ICD-10-CM

## 2011-04-15 DIAGNOSIS — Z136 Encounter for screening for cardiovascular disorders: Secondary | ICD-10-CM

## 2011-04-15 DIAGNOSIS — Z0000 Encounter for general adult medical examination without abnormal findings: Secondary | ICD-10-CM

## 2011-04-15 DIAGNOSIS — N529 Male erectile dysfunction, unspecified: Secondary | ICD-10-CM | POA: Insufficient documentation

## 2011-04-15 HISTORY — DX: Male erectile dysfunction, unspecified: N52.9

## 2011-04-15 LAB — COMPREHENSIVE METABOLIC PANEL
ALT: 13 U/L (ref 0–53)
AST: 18 U/L (ref 0–37)
Albumin: 4 g/dL (ref 3.5–5.2)
Alkaline Phosphatase: 78 U/L (ref 39–117)
Calcium: 9.3 mg/dL (ref 8.4–10.5)
Chloride: 100 mEq/L (ref 96–112)
Potassium: 4.7 mEq/L (ref 3.5–5.1)

## 2011-04-15 LAB — LIPID PANEL
HDL: 51.9 mg/dL (ref >39.00)
Total CHOL/HDL Ratio: 3
VLDL: 43.4 mg/dL — ABNORMAL HIGH (ref 0.0–40.0)

## 2011-04-15 LAB — PSA: PSA: 1.1 ng/mL (ref 0.10–4.00)

## 2011-04-15 LAB — LDL CHOLESTEROL, DIRECT: Direct LDL: 99.4 mg/dL

## 2011-04-15 LAB — URIC ACID: Uric Acid, Serum: 10.5 mg/dL — ABNORMAL HIGH (ref 4.0–7.8)
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
## 2011-04-29 NOTE — Op Note (Signed)
NAMESIM, CHOQUETTE NO.:  [id number]   MEDICAL RECORD NO.:  [id number]          PATIENT TYPE:  INP   LOCATION:  2899                         FACILITY:  [facility]   PHYSICIAN:  Claude Manges. Whitfield, M.D.DATE OF BIRTH:  1936-05-28   DATE OF PROCEDURE:  09/28/2004  DATE OF DISCHARGE:                                 OPERATIVE REPORT   PREOPERATIVE DIAGNOSIS:  End-stage osteoarthritis, left knee.   POSTOPERATIVE DIAGNOSIS:  End-stage osteoarthritis, left knee.   PROCEDURE:  Left total knee replacement.   SURGEON:  Claude Manges. Cleophas Dunker, M.D.   ASSISTANT:  Jamelle Rushing, P.A.-C.   ANESTHESIA:  General orotracheal with supplemental femoral nerve block.   COMPLICATIONS:  None.   COMPONENTS:  Dupuy LCS complete large plus femoral component, #6 keel  rotating tibial tray with a 15 mm bridging bearing and a metal-backed three-  peg patella.  All of this was secured with polymethyl methacrylate with 1.2  g of tobramycin per pack.   DESCRIPTION OF PROCEDURE:  With the patient comfortable on the operating  room table and under general orotracheal anesthesia, and a supplemental left  femoral nerve block, the left lower extremity was placed in a thigh  tourniquet.  The nursing staff inserted a Foley catheter.  The leg was then  prepped with Duraprep from the tourniquet to the mid-foot.  Sterile draping  was performed.  With the extremity still elevated, it was Esmarch  exsanguinated with the proximal tourniquet at 350 mmHg.  A midline  longitudinal incision was made, centered about the patella, extending from  the superior pouch to the tibial tubercle.  With very sharp dissection the  incision was carried down through the subcutaneous tissues through the first  layer of capsule.  A medial parapatellar incision was made with the Bovie.  The joint was entered with approximately 15 mL of clear yellow joint  effusion.  The patella was everted 180 degrees and the knee flexed to  90  degrees.  There were large osteophytes circumferentially about the patella,  and also along the lateral and medial femoral condyle.  There was a fixed  varus position.  A medial release was performed.  There was little if any  articular cartilage, particularly along the medial compartment, and to a  large extent along the lateral compartment.  Preoperatively we had measured either a large or a large plus femoral  component.  The large plus was confirmed intraoperatively.  We measured  either a #5 or #6 tibial tray.  The #6 tray was confirmed intraoperatively.  Initial cuts were then made transversely along the proximal tibia with the  seven degrees of declination.  There were large osteophytes on the medial  tibial plateau which were removed.  Subsequent cuts were then made on the  femur using the large plus templates.  Large osteophytes were again removed  from the medial and lateral femoral condyle.  Laminar spreaders were  inserted into the joint, and osteophytes removed from the posterior femoral  condyle medially and laterally.  The medial and lateral menisci were removed  with the Bovie.  The  ACL and PCL were sacrificed.  The MCL and LCL remained  intact throughout the operative procedure.  A retractor was then placed about the tibia.  We again confirmed the #6  tray.  A center cut was made, followed by the keel cut.  We used the 15 mm  flexion and extension gaps which were symmetrical throughout the procedure.  Accordingly we inserted a 15 mm bridging bearing, followed by the large plus  femoral component, with a full range of motion.  We had full extension, no  opening with varus or valgus stress.  No malrotation of the tibial  polyethylene component.  The patella was repaired by removing 13 mm of bone,  leaving approximately 18 mm of patella thickness.  The tri-pick template was  inserted.  The trial patella was then inserted.  Through a full range of  motion, there was no  subluxation.  The trial components were removed.  The  joint was copiously irrigated with saline solution with the jet saline.  The  final components were then inserted with polymethyl methacrylate.  We used  two packs of glue, and each were supplemented with 1.2 mg of tobramycin.  The trans-methacrylate was removed with the [facility].  The knee was placed in  extension.  The methacrylate matured and any extraneous hard methacrylate  was removed with an osteotome.  The joint was again irrigated with jet  saline.  The joint was inspected without evidence of loose material.  The  tourniquet was deflated.  Gross bleeders were Bovie-coagulated.  A Hemovac  was not necessary.  The deep capsule was closed with an interrupted #1  Ethibond.  The superficial capsule was closed with a running #0 Vicryl.  The  subcutaneous was closed with #2-0 Vicryl and the skin closed with skin  clips.  A sterile bulky dressing was applied, followed by the patient's  support stocking.  There were no complications.       PWW/MEDQ  D:  09/28/2004  T:  09/28/2004  Job:  [id number]

## 2011-04-29 NOTE — Op Note (Signed)
NAME:  CALLEN, VANCUREN              ACCOUNT NO.:  [id number]   MEDICAL RECORD NO.:  [id number]          PATIENT TYPE:  INP   LOCATION:  5002                         FACILITY:  [facility]   PHYSICIAN:  Doralee Albino. Carola Frost, M.D. DATE OF BIRTH:  [birth year]   DATE OF PROCEDURE:  10/20/2005  DATE OF DISCHARGE:                                 OPERATIVE REPORT   PREOPERATIVE DIAGNOSIS:  Right transverse posterior wall acetabular  fracture.   POSTOPERATIVE DIAGNOSIS:  Right transverse posterior wall acetabular  fracture.   PROCEDURE:  Open reduction and internal fixation, right transverse posterior  wall acetabular fracture.   SURGEON:  Doralee Albino. Carola Frost, M.D.   ASSISTANT:  Cecil Cranker, PA   ANESTHESIA:  General.   COMPLICATIONS:  None.   ESTIMATED BLOOD LOSS:  250 mL.   DISPOSITION:  To PACU.   CONDITION:  Stable.   BRIEF SUMMARY OF INDICATION FOR PROCEDURE:  Kaleel Schmieder is a 75 year old  white male who sustained a right posterior wall transverse acetabular  fracture-dislocation, treated with closed reduction by Dr. Cleophas Dunker and  subsequent CT scan which demonstrated marginal impaction, some intra-  articular fragments which are small, as well as a comminuted posterior wall  fracture and a transverse fracture.  We had a long discussion with the  patient and his daughter regarding the risks and benefits of surgery,  including the possibility of nerve injury, vessel injury, heterotopic bone,  dislocation, arthritis, avascular necrosis, need for further surgery,  thromboembolic complications, and the standard perioperative complications  such as heart attack and stroke.  After full discussion of his risks and  benefits, the patient wished to proceed.  Of note, he did have a transient  peroneal nerve paresthesia involving the deep peroneal branches without  motor loss.   BRIEF DESCRIPTION OF PROCEDURE:  Mr. Loewe was administered preoperative  antibiotics, taken to the  operating room, where his right lower extremity  was prepped and draped in the usual sterile fashion.  After being positioned  with the right side up using the hip positioners, a 15 cm incision centered  over the greater trochanter was made and the dissection carried down to the  tensor, which was split just posterior to the middle aspect of the greater  trochanter.  The Charnley retractor was placed.  There was extensive  hematoma in the medius musculature as well as the bursa.  The bursa was  excised and the posterior aspect of the greater trochanter palpated.  An  [employer] was placed under the medius, revealing the short rotators as well  as the piriformis insertion.  These were both robust tendons.  They were  divided near their insertion and captured with a tendon-grabbing suture  technique using #1 Vicryl.  The sciatic nerve was then palpated and  dissection continued down the ischium underneath the obturators, revealing  the transverse fracture.  The interval between minimus and the capsule was  then developed and capsular attachments protected and left intact to the  multiple wall fragments.  There was extensive amount of hematoma, which was  cleared away, and the bony surfaces on  both the wall fragments as well as  the pelvis were debrided with lavage and curettes.  A transverse fracture  was essentially nondisplaced.  The posterior wall was probably in four  different pieces, one of which was nondisplaced.  The femoral head was  examined and a trocar placed into the greater trochanter to allow  distraction through that Schanz pin and better visualization of the joint  articular surface.  Traction was pulled and lavage and inspection performed  of the joint.  The ligamentum had been torn and was partially protruding,  and this was truncated entirely and some small articular fragments also were  attached to this.  There were no major large fragments, and there was a  significant  amount of impaction right along the edge of the fracture  adjacent to the transverse component.  A half-inch and fourth-inch osteotome  were used to elevate this segment and push it onto the femoral head as a  template.  Crushed cancellous chips were pushed in behind this segment to  keep it elevated.  There was also a 1 x 1 cm segment that was completely  crushed into the pelvis well away from the articular surface.  This free  piece was pried up and also fit into the intact articular segment.  Similarly graft was placed behind this.  The wall fragments were then folded  down and provisionally fixed with lag screws.  Multiple images were obtained  showing appropriate placement and reduction.  The pieces were not fixed with  numerous lag screws because of the possibility of fragmentation.  Similarly  the posterior wall segments extended far down close to the posterior column,  making plate placement here difficult as well because of the limited space  available, particularly high on the column.  The wall fragment was then  buttressed with a Recon plate with fixation into the ilium and the supra-  acetabular region.  One of the fragmented portions of the posterior wall  then required additional fixation using a spring plate to provide additional  rotational control of this segment.  The posterior column was then plated  using an over-contoured plate to achieve some compression at that site as  well.  We did also investigate possible placement of an anterior column  screw; however, the starting point for this was obstructed by the most  proximal screws for the buttress plate as well as the spring plate.  Consequently, we did not insert any further fixation.  Final images were  obtained showing appropriate reduction and hardware placement.  The wound  was copiously irrigated and then closed in standard layered fashion, repairing the rotators to the bone tunnels, the tensor with a figure-of-   eight #1 Vicryl, the deep subcu with 0, the subcu with 2-0 and the skin with  staples.  A sterile gently compressive dressing was then applied.  We also  placed him into an abduction pillow.  He was taken to the PACU in stable  condition.   PROGNOSIS:  Mr. Laspina remains at increased risk for development of  arthritis.  He will also undergo radiation for heterotopic ossification  prophylaxis.  He will be on Coumadin for DVT prophylaxis.  He will be  touchdown weightbearing for the next eight weeks with gradually progressive  weightbearing thereafter.  He will also have strict hip precautions.      Doralee Albino. Carola Frost, M.D.  Electronically Signed     MHH/MEDQ  D:  10/23/2005  T:  10/24/2005  Job:  [id number]
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
## 2011-04-29 NOTE — Op Note (Signed)
NAME:  [name], [name]                          ACCOUNT NO.:  [id number]   MEDICAL RECORD NO.:  [id number]                   PATIENT TYPE:  AMB   LOCATION:  DSC                                  FACILITY:  [facility]   PHYSICIAN:  Claude Manges. Cleophas Dunker, M.D.            DATE OF BIRTH:  [birth year]   DATE OF PROCEDURE:  02/14/2003  DATE OF DISCHARGE:                                 OPERATIVE REPORT   PREOPERATIVE DIAGNOSIS:  1. Rotator cuff tear with impingement left shoulder.  2. Degenerative joint disease acromioclavicular joint.   POSTOPERATIVE DIAGNOSIS:  1. Rotator cuff tear with impingement left shoulder.  2. Degenerative joint disease acromioclavicular joint.  3. Tear of biceps tendon.   PROCEDURE:  1. Open rotator cuff tear repair with acromioplasty.  2. Repair of biceps tendon.  3. Resection of distal clavicle.   SURGEON:  Claude Manges. Cleophas Dunker, M.D.   ANESTHESIA:  General orotracheal.   COMPLICATIONS:  None.   INDICATIONS FOR PROCEDURE:  This 75 year old gentleman slipped and fell on  the ice a month ago with acute onset of left shoulder pain. He now has  difficulty with range of motion and sleeping at night as a result of his  pain and has had significant compromise of his activities.  He has  considerable degenerative change of the [facility] joint with clinical impingement.  MRI scan reveals considerable rotator cuff tear and now is to have open  exploration.   DESCRIPTION OF PROCEDURE:  The patient is comfortable on the operating table  and under general orotracheal anesthesia, the patient was placed in the  semisitting position on the shoulder frame. The left shoulder was then  prepped with Duraprep from the base of the neck circumferentially below the  elbow.  Sterile draping was performed.  The skin incision was outlined  beginning at the [facility] joint coursing along the anterior aspect of the  shoulder.  Via sharp dissection, the incision was carried down to the  subcutaneous  tissue. Small bleeders were Bovie coagulated. The [facility] joint was  identified and incised. There was considerable debris in the [facility] joint. This  was removed with a rongeur.  A distal clavicle resection was removed  excising approximately the distal 1 cm. Any further osteophytes were removed  with the rongeur, so that there was no further impingement beneath the  clavicle.  Bone wax was applied to the bleeding bone surface.   There was a large anterior acromial spur which was resected with the rongeur  and then an anterior inferior acromioplasty was performed such that there  was no further impingement.   There was a large rotator cuff tear with rounded edges, at least an inch in  length beginning along the supraspinatus tendon extending just beyond the  biceps tendon to include a portion of the subscapularis.  The biceps tendon  was frayed and then areas were torn. These were sharply debrided and then  I  repaired the tear with a running 2-0 Vicryl.  The edges of the rotator cuff  were then sharply debrided so there was good bleeding edge. The bone was  roughened and two Mitek anchors were inserted. I supplemented the repair  with 0 Vicryl with tendon to tendon repair.  I thought it was an excellent  repair. There was no tension or displacement with range of motion.  There  was no evidence of any further impingement. The wound was then irrigated  with saline solution. The deltoid fascia was reapproximated anatomically  followed by the [facility] joint capsule. The wound was again irrigated. The subcu  was closed with 2-0 Vicryl. The skin was closed with skin clips. A sterile  bulky dressing was applied followed by a sling. The patient had interscalene  block for postoperative pain control.   PLAN:  Percocet for pain.  Recovery care overnight. Office in one week.                                               Claude Manges. Cleophas Dunker, M.D.    PWW/MEDQ  D:  02/14/2003  T:  02/15/2003  Job:  [id number]
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
## 2011-04-29 NOTE — H&P (Signed)
NAME:  [name], [name]              ACCOUNT NO.:  [id number]   MEDICAL RECORD NO.:  [id number]          PATIENT TYPE:  INP   LOCATION:  NA                           FACILITY:  [facility]   PHYSICIAN:  Claude Manges. Whitfield, M.D.DATE OF BIRTH:  [birth year]   DATE OF ADMISSION:  09/28/2004  DATE OF DISCHARGE:                                HISTORY & PHYSICAL   CHIEF COMPLAINT:  Left knee pain.   HISTORY OF PRESENT ILLNESS:  [name] is a very pleasant 75 year old  white male with left knee pain.  The knee pain has been present for years;  however, over the past year the pain became considerably worse.  He has no  history of injury or trauma to the left knee.  Pain is now a constant dull  achy pain with some knife-like pains at times.  Pain does awaken him at  night.  Mechanical symptoms positive for catching, painful popping, and  giving away sensation.  Pain in the knee is worse with standing and also  going down stairs.  He occasionally uses a cane to ambulate.  Failed  conservative treatment that included cortisone injections and Hyalgan  injections.  He has undergone multiple aspirations of the knee due to  swelling and accumulation of joint fluid.  Left knee x-rays show end-stage  osteoarthritis of all three compartments.  Patient to undergo left total  knee arthroplasty by Dr. Cleophas Dunker on September 28, 2004, at [facility].   ALLERGIES:  No known drug allergies.   MEDICATIONS:  1.  Metformin 500 mg daily.  2.  Lipitor 10 mg daily.  3.  Labetalol 200 mg one daily.  4.  Cosamin daily.  5.  Chromium picolinate 500 mg daily.  6.  Over-the-counter Zantac p.r.n.   PAST MEDICAL HISTORY:  1.  Diabetes mellitus.  2.  Hypertension.  3.  Dyslipidemia.  4.  Occasional gastric reflux.  5.  History of kidney stones.   PAST SURGICAL HISTORY:  Shoulder arthroscopy with open rotator cuff repair  done by Dr. Cleophas Dunker in March 2004.   SOCIAL HISTORY:  The patient denies any  tobacco use.  He occasionally uses  alcohol.  He is married, has two grown children.  Primary care physician is  [name], M.D.  The patient lives in a one-story home with two  steps to the usual entrance.  He is a retired Visual merchandiser.   FAMILY HISTORY:  Mother deceased, age 78, due to ovarian cancer.  Father  deceased due to questionable aortic aneurysm.  He has two living brothers,  one with coronary artery disease at age 74, the other is healthy at age 25.   REVIEW OF SYSTEMS:  The patient wears glasses.  Denies any chest pain,  shortness of breath, PND, or orthopnea.  The remainder of review of systems  is negative.   PHYSICAL EXAMINATION:  GENERAL:  The patient is a well-developed, well-  nourished male.  Walks with a slight limp on the left.  The patient's mood  and affect are appropriate.  Talks easily with the examiner.  VITAL SIGNS:  The patient's height is 6 feet 5 inches, weight 236 pounds.  Vital signs:  Temperature is 98.7 degrees Fahrenheit, blood pressure is  158/100, pulse is 64, respiratory rate 14.  CARDIAC:  Regular rate and rhythm.  No murmurs, rubs, or gallops noted.  RESPIRATORY:  Lungs are clear to auscultation bilaterally.  No wheezing,  rales, or rhonchi noticed.  ABDOMEN:  Soft, nontender, no hepatomegaly, no splenomegaly.  Bowel sounds  x4 quadrants.  BACK:  No tenderness to palpation over the thoracic or lumbar spine.  BREASTS, GENITOURINARY, RECTAL:  All deferred at the present time.  NECK:  Trachea is midline without lymphadenopathy.  Carotids are 2+ without  bruits.  The patient has good range of motion of the cervical spine without  pain and no pain with palpation over the cervical column.  HEENT:  Head is normocephalic, atraumatic without frontal or maxillary sinus  tenderness to palpation.  Conjunctivae are pink.  Sclerae are nonicteric.  PERRLA, EOMs are intact.  No visible external ear deformities noted.  TMs  pearly and gray bilaterally.  Nose:   Nasal septum midline, nasal mucosa pink  and moist without polyps.  Buccal mucosa pink and moist.  Good dentition.  Pharynx without erythema or exudate.  Tongue and uvula midline.  NEUROLOGIC:  The patient is alert and oriented x3.  Cranial nerves II-XII  are grossly intact.  Deep tendon reflexes in the upper and lower extremities  are symmetric and equal throughout.  Upper and lower extremity strength  testing reveals 5/5 strength throughout.  MUSCULOSKELETAL:  Upper extremities are equal in size and shape throughout.  The patient has full range of motion of the shoulders, elbows, wrists, and  hands.  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  Radial pulses are 2+ bilaterally.  Lower extremities:  The  patient has full range of motion of both hips, flexion of 90 degrees  bilaterally.  Internal rotation of the left hip has caused some left knee  pain.  Left knee:  0-115 degrees of flexion.  No pain with palpation along  the joint line.  Valgus and varus stressing reveals no laxity.  Anterior  drawer is negative.  No evidence of effusion or edema in the left knee.  Right knee:  0-122 degrees flexion, no valgus or varus laxity.  Anterior  drawer is negative.  Palpation along the joint line reveals no tenderness.  There is no effusion or edema noted on exam.  Lower extremities nonedematous  bilaterally.  Dorsal pedal pulses 2+ bilaterally.  He does have decreased  sensation in both feet.   X-RAYS:  X-ray of the left knee shows end-stage osteoarthritis of all three  compartments.   IMPRESSION:  1.  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  2.  Osteoarthritis, right knee.  3.  Hypertension.  4.  Dyslipidemia.  5.  Occasional gastric reflux.  6.  History of kidney stones.  7.  Neuropathy of bilateral feet.   PLAN:  The patient is to be admitted to [facility] on September 28, 2004, and undergo a left total knee  arthroplasty by Dr. Cleophas Dunker.  Prior to  admission the patient is to see Dr. Debby Bud for his hypertension and make a  medicine adjustment if needed.  The patient will also undergo all  preoperative testing with labs prior to surgery.       GC/MEDQ  D:  09/16/2004  T:  09/16/2004  Job:  [id number] 

## 2011-04-29 NOTE — Discharge Summary (Signed)
NAME:  [name], [name]              ACCOUNT NO.:  [id number]   MEDICAL RECORD NO.:  [id number]          PATIENT TYPE:  INP   LOCATION:  5003                         FACILITY:  [facility]   PHYSICIAN:  Claude Manges. Whitfield, M.D.DATE OF BIRTH:  1936/04/18   DATE OF ADMISSION:  09/28/2004  DATE OF DISCHARGE:  10/02/2004                                 DISCHARGE SUMMARY   ADMISSION DIAGNOSES:  1.  End-stage osteoarthritis bilateral knees, left worse than right.  2.  Hypertension.  3.  Type 2 diabetes mellitus.  4.  Dyslipidemia.  5.  Mild gastroesophageal reflux disease.  6.  Neuropathy of bilateral feet.  7.  History of kidney stones.   DISCHARGE DIAGNOSES:  1.  End-stage osteoarthritis bilateral knees, left worse than right status      post left total knee arthroplasty.  2.  Acute blood loss anemia secondary to surgery.  3.  Type 2 diabetes mellitus.  4.  Hypertension.  5.  Dyslipidemia.  6.  Mild gastroesophageal reflux disease.  7.  Neuropathy of bilateral feet.  8.  History of kidney stones.   SURGICAL PROCEDURE:  On September 28, 2004, [name] underwent a left  total knee arthroplasty by Dr. Claude Manges. Cleophas Dunker, assisted by Arlyn Leak,  P.A.-C.  He had an LCS complete middle back patella cemented, size large  plus with an LCS complete primary femoral component, cemented size large  plus left, a DePuy M.B.T. keel tibial tray cemented size 6 and an LCS  complete RP insert large plus 15 mm thickness.   COMPLICATIONS:  None.   CONSULTATIONS:  1.  Pharmacy consult for Coumadin therapy and a case management consult      September 28, 2004.  2.  Physical therapy consult and rehabilitation medicine consult September 29, 2004.  3.  Occupational therapy consult September 30, 2004.   HISTORY OF PRESENT ILLNESS:  This 75 year old white male patient presented  to Dr. Cleophas Dunker with left knee pain for the last several years.  It is now  constant, dull, and achy and knifelike at times.   It awakens him at night.  It is worse with standing and going down stairs.  He complains of catching,  popping, and giving way.  He has failed conservative treatment and x-rays  show end-stage changes.  Because of this, he is presenting for a left knee  replacement.   HOSPITAL COURSE:  [name] tolerated his surgical procedure well without  immediate postoperative complications.  On postoperative day #1 he was  afebrile, vitals stable.  He was started on therapy per protocol.  On  postoperative day #2, he had a run of fever the night before, a temperature  max of 102.3.  He was complaining of feeling weak.  Hemoglobin was a bit  low.  This was monitored and he was started on iron.  He was continued with  physical therapy and switched to p.o. pain medications.  On postoperative  day #3, temperature max was 100.3 with 102.1 the night before.  Hemoglobin  had dropped to 8 with hematocrit of 22.8.  Because of that, he was  subsequently transfused with 2 units of packed red blood cells.  He did have  one episode of chest pain.  That was worked up and the workup was negative.  The next morning he was feeling much better.  Pain was well controlled,  afebrile, vitals stable.  Cardiac enzymes were negative.  It was felt he was  ready for discharge home and was discharged home later that day.   DISCHARGE INSTRUCTIONS:  Diet:  He can resume his regular pre-  hospitalization diet.   MEDICATIONS:  He may resume his home medications.  These included:  1.  Metformin 500 mg p.o. daily.  2.  Lipitor 10 mg p.o. daily.  3.  Labetalol 200 mg p.o. daily.  4.  Cosamin 1 tablet p.o. daily.  5.  Chromium picolinate 500 mg p.o. daily.  6.  Zantac over-the-counter as needed p.r.n.   ADDITIONAL MEDICATIONS AT THIS TIME:  1.  Coumadin to take as directed by pharmacy at 6 p.m. every evening for one      month.  2.  Percocet 5/325 mg 1-2 tablets p.o. q.4-6h. p.r.n. for pain.   ACTIVITY:  He can be out of  bed, partial weightbearing 50% or less on the  left leg with the use of a walker.  He is to have home CPM 0-100 degrees 6-8  hours a day.  Home health PT per [facility].  Please see the  blue total knee discharge sheet for further activity instructions.   WOUND CARE:  He may shower after no drainage from the wound for two days.  Please see the blue total knee discharge sheet for further wound care  instructions.   FOLLOWUP:  He needs to follow up with Dr. Cleophas Dunker in our office  approximately 10-12 days after discharge and needs to call [phone] for that  appointment.   LABORATORY DATA:  On 09/22/04, white count 11.7, hemoglobin 14.5, hematocrit  41.2, and platelets 264,000.  His white count subsequently dropped from then  from the admission point to 8.6 on October 02, 2004.  Hemoglobin ranged from  14.5 on admission to a low of 8 on October 01, 2004 to 8.9 on October 02, 2004.  Hematocrit went from 41.2 to a low of 22.8 on October 01, 2004 and  25.2 on October 02, 2004.  Platelets ranged from 264,000 on September 22, 2004  to 259,000 on October 02, 2004.   INR on admission was 0.9.  On October 02, 2004, PT was 14.4, INR 1.2.   On September 22, 2004, glucose was 146 and went to a high of 166 on September 29, 2004, and 157 on September 30, 2004.  Sodium ranged from 141 with  potassium of 4.9 on September 22, 2004 to 134 and 3.4 on September 30, 2004.  CK, CK-MB, and troponin on October 01, 2004 was all within normal limits.  All other laboratory studies were within normal limits.      Sherrilyn Rist   KED/MEDQ  D:  12/09/2004  T:  12/09/2004  Job:  [id number]   cc:   [name], M.D. [facility]

## 2011-05-10 ENCOUNTER — Other Ambulatory Visit: Payer: Self-pay | Admitting: Internal Medicine

## 2011-06-17 ENCOUNTER — Other Ambulatory Visit: Payer: Self-pay | Admitting: Internal Medicine

## 2011-09-07 ENCOUNTER — Other Ambulatory Visit: Payer: Self-pay | Admitting: Internal Medicine

## 2011-09-23 ENCOUNTER — Telehealth: Payer: Self-pay | Admitting: *Deleted

## 2011-09-23 MED ORDER — TESTOSTERONE CYPIONATE 200 MG/ML IM SOLN: 200.0000 mg | INTRAMUSCULAR | Status: DC

## 2011-09-23 NOTE — Telephone Encounter (Signed)
Ok for refill x2 

## 2011-09-23 NOTE — Telephone Encounter (Signed)
Rosanne Ashing (pharmacist) given verbal Rx refill.

## 2011-09-23 NOTE — Telephone Encounter (Signed)
Refill request for testosterone. Pts last ov was 04/15/2011, Please Advise refills

## 2011-09-26 ENCOUNTER — Other Ambulatory Visit: Payer: Self-pay | Admitting: Internal Medicine

## 2011-10-15 ENCOUNTER — Other Ambulatory Visit: Payer: Self-pay | Admitting: Internal Medicine

## 2011-11-12 ENCOUNTER — Other Ambulatory Visit: Payer: Self-pay | Admitting: Internal Medicine

## 2011-12-09 ENCOUNTER — Other Ambulatory Visit (INDEPENDENT_AMBULATORY_CARE_PROVIDER_SITE_OTHER): Payer: Medicare Other

## 2011-12-09 ENCOUNTER — Encounter: Payer: Self-pay | Admitting: Internal Medicine

## 2011-12-09 ENCOUNTER — Ambulatory Visit (INDEPENDENT_AMBULATORY_CARE_PROVIDER_SITE_OTHER): Payer: Medicare Other | Admitting: Internal Medicine

## 2011-12-09 VITALS — BP 118/68 | HR 93 | Temp 98.4°F

## 2011-12-09 DIAGNOSIS — I10 Essential (primary) hypertension: Secondary | ICD-10-CM

## 2011-12-09 DIAGNOSIS — R300 Dysuria: Secondary | ICD-10-CM

## 2011-12-09 DIAGNOSIS — M109 Gout, unspecified: Secondary | ICD-10-CM

## 2011-12-09 LAB — URINALYSIS
Bilirubin Urine: NEGATIVE
Leukocytes, UA: NEGATIVE
Specific Gravity, Urine: >=1.03 (ref 1.000–1.030)
Urobilinogen, UA: 0.2 (ref 0.0–1.0)

## 2011-12-09 MED ORDER — COLCHICINE 0.6 MG PO TABS: 0.6000 mg | ORAL_TABLET | Freq: Every day | ORAL | Status: DC

## 2011-12-09 NOTE — Progress Notes (Signed)
  Subjective:    Patient ID: [name], male    DOB: 1936/08/03, 75 y.o.   MRN: [id number]  HPI complains of gout flare in R elbow Chronic hx same in same location  Also dysuria, ?kidney infection  Also concern about variations in BP at home  Past Medical History  Diagnosis Date  . Gout   . Diabetes mellitus, type 2   . Hyperlipidemia   . Hypertension      Review of Systems  Constitutional: Negative for fever and fatigue.  Respiratory: Negative for cough and shortness of breath.   Cardiovascular: Negative for chest pain and palpitations.       Objective:   Physical Exam BP 118/68  Pulse 93  Temp(Src) 98.4 F (36.9 C) (Oral)  SpO2 98% Gen: NAD Mskel: swelling of R elbow - tophi present same location - Back: full range of motion of thoracic and lumbar spine. Non tender to palpation. Negative straight leg raise. DTR's are symmetrically intact. Sensation intact in all dermatomes of the lower extremities. Full strength to manual muscle testing. patient is able to heel toe walk without difficulty and ambulates with antalgic gait.   Lab Results  Component Value Date   WBC 12.0* 04/29/2010   HGB 15.7 04/29/2010   HCT 46.1 04/29/2010   PLT 249.0 04/29/2010   GLUCOSE 136* 04/15/2011   CHOL 181 04/15/2011   TRIG 217.0* 04/15/2011   HDL 51.90 04/15/2011   LDLDIRECT 99.4 04/15/2011   LDLCALC 103* 04/29/2010   ALT 13 04/15/2011   AST 18 04/15/2011   NA 139 04/15/2011   K 4.7 04/15/2011   CL 100 04/15/2011   CREATININE 1.5 04/15/2011   BUN 30* 04/15/2011   CO2 29 04/15/2011   TSH 1.05 04/15/2011   PSA 1.10 04/15/2011   HGBA1C 8.1* 04/15/2011        Assessment & Plan:  Gout - chronic in R elbow -  Start colchine - erx done  Dysuria - check UA - back exam benign  HTN - reassurance provided - no med changes today

## 2011-12-09 NOTE — Patient Instructions (Signed)
It was good to see you today. Start colchicine for gout flare - Your prescription(s) have been submitted to your pharmacy. Please take as directed and contact our office if you believe you are having problem(s) with the medication(s). Test(s) ordered today. Your results will be called to you after review (48-72hours after test completion). If any changes need to be made, you will be notified at that time. blood pressure looks ok today- no medication changes recommended Please schedule followup in 2 weeks with Dr Debby Bud, call sooner if problems.

## 2011-12-12 ENCOUNTER — Encounter: Payer: Self-pay | Admitting: Internal Medicine

## 2011-12-22 ENCOUNTER — Ambulatory Visit (INDEPENDENT_AMBULATORY_CARE_PROVIDER_SITE_OTHER): Payer: Medicare Other | Admitting: Internal Medicine

## 2011-12-22 VITALS — BP 136/88 | HR 75 | Temp 98.0°F | Wt 222.0 lb

## 2011-12-22 DIAGNOSIS — M109 Gout, unspecified: Secondary | ICD-10-CM

## 2011-12-22 DIAGNOSIS — Z23 Encounter for immunization: Secondary | ICD-10-CM | POA: Diagnosis not present

## 2011-12-22 DIAGNOSIS — E119 Type 2 diabetes mellitus without complications: Secondary | ICD-10-CM

## 2011-12-22 DIAGNOSIS — I10 Essential (primary) hypertension: Secondary | ICD-10-CM

## 2011-12-22 MED ORDER — ALLOPURINOL 300 MG PO TABS: 300.0000 mg | ORAL_TABLET | Freq: Every day | ORAL | Status: DC

## 2011-12-22 NOTE — Patient Instructions (Signed)
Blood pressure control - variable readings especially if dose is given on schedule  Plan - we need to change one drug at a time. First is to reduce the lasix to 20 mg once a day and continue the other medications. Let me know your readings and we can make continued adjustments - only changing one drug at a time.   Gout - will resume allopurinol.  Shingles vaccine today.

## 2011-12-25 ENCOUNTER — Encounter: Payer: Self-pay | Admitting: Internal Medicine

## 2011-12-25 NOTE — Assessment & Plan Note (Signed)
Lab Results  Component Value Date   HGBA1C 8.1* 04/15/2011   Will continue present regimen but increase dietary vigilance. Repeat A1C in 3 months, if not closer to goal will augment medication table.

## 2011-12-25 NOTE — Assessment & Plan Note (Signed)
BP Readings from Last 3 Encounters:  12/22/11 136/88  12/09/11 118/68  04/15/11 136/78   Home readings reviewed and do reflect a wide variation.  Plan decrease lasix to 20 mg once a day, continue amlodipine 10 and lisinopril 20         For continued difficulty in regulating BP will reduce amlodipine to 5 mg         He will monitor BP and report back.

## 2011-12-25 NOTE — Assessment & Plan Note (Signed)
He had stopped allopurinol and subsequently has had a couple of flares of acute gout.  Plan - resume allopurinol at 300 mg daily.

## 2011-12-25 NOTE — Progress Notes (Signed)
  Subjective:    Patient ID: [name], male    DOB: 1936-09-11, 76 y.o.   MRN: [id number]  HPI [name] is having trouble controlling his BP. If he takes his medication as scheduled he will develop hypotension. He has been spacing out his dosing, including getting up in the middle of the night to maintain good control. He has had no symptoms.  I have reviewed the patient's medical history in detail and updated the computerized patient record.    Review of Systems System review is negative for any constitutional, cardiac, pulmonary, GI or neuro symptoms or complaints other than as described in the HPI.      Objective:   Physical Exam Filed Vitals:   12/22/11 1136  BP: 136/88  Pulse: 75  Temp: 98 F (36.7 C)   Tall and lean white man in no distress HEENT- C&S clear Pulm- normal respirations Cor - RRR Neuro - A&O x 3, nl gait, nl strength       Assessment & Plan:

## 2011-12-30 ENCOUNTER — Telehealth: Payer: Self-pay | Admitting: *Deleted

## 2011-12-30 MED ORDER — LISINOPRIL 20 MG PO TABS: 20.0000 mg | ORAL_TABLET | Freq: Every day | ORAL | Status: DC

## 2011-12-30 NOTE — Telephone Encounter (Signed)
Refill request lisinopril 20mg .

## 2012-02-25 ENCOUNTER — Other Ambulatory Visit: Payer: Self-pay | Admitting: Internal Medicine

## 2012-02-27 NOTE — Telephone Encounter (Signed)
Viagra request [last refill 11.03.12 #[id number]]

## 2012-05-04 ENCOUNTER — Other Ambulatory Visit: Payer: Self-pay | Admitting: Internal Medicine

## 2012-06-07 ENCOUNTER — Ambulatory Visit (INDEPENDENT_AMBULATORY_CARE_PROVIDER_SITE_OTHER): Payer: Medicare Other | Admitting: Internal Medicine

## 2012-06-07 ENCOUNTER — Encounter: Payer: Self-pay | Admitting: Internal Medicine

## 2012-06-07 ENCOUNTER — Other Ambulatory Visit (INDEPENDENT_AMBULATORY_CARE_PROVIDER_SITE_OTHER): Payer: Medicare Other

## 2012-06-07 VITALS — BP 130/80 | HR 100 | Temp 97.1°F | Resp 16 | Ht 78.0 in | Wt 218.0 lb

## 2012-06-07 DIAGNOSIS — E785 Hyperlipidemia, unspecified: Secondary | ICD-10-CM

## 2012-06-07 DIAGNOSIS — Z0000 Encounter for general adult medical examination without abnormal findings: Secondary | ICD-10-CM | POA: Diagnosis not present

## 2012-06-07 DIAGNOSIS — M109 Gout, unspecified: Secondary | ICD-10-CM | POA: Diagnosis not present

## 2012-06-07 DIAGNOSIS — E119 Type 2 diabetes mellitus without complications: Secondary | ICD-10-CM

## 2012-06-07 DIAGNOSIS — N200 Calculus of kidney: Secondary | ICD-10-CM

## 2012-06-07 DIAGNOSIS — I10 Essential (primary) hypertension: Secondary | ICD-10-CM | POA: Diagnosis not present

## 2012-06-07 DIAGNOSIS — K4190 Unilateral femoral hernia, without obstruction or gangrene, not specified as recurrent: Secondary | ICD-10-CM

## 2012-06-07 DIAGNOSIS — Z1211 Encounter for screening for malignant neoplasm of colon: Secondary | ICD-10-CM

## 2012-06-07 LAB — URINALYSIS, ROUTINE W REFLEX MICROSCOPIC
Ketones, ur: NEGATIVE
Leukocytes, UA: NEGATIVE
Nitrite: NEGATIVE
Specific Gravity, Urine: 1.01 (ref 1.000–1.030)
Urobilinogen, UA: 0.2 (ref 0.0–1.0)

## 2012-06-07 LAB — COMPREHENSIVE METABOLIC PANEL
Albumin: 3.8 g/dL (ref 3.5–5.2)
BUN: 24 mg/dL — ABNORMAL HIGH (ref 6–23)
CO2: 27 mEq/L (ref 19–32)
Calcium: 9.4 mg/dL (ref 8.4–10.5)
Chloride: 106 mEq/L (ref 96–112)
GFR: 58.38 mL/min — ABNORMAL LOW (ref >60.00)
Glucose, Bld: 137 mg/dL — ABNORMAL HIGH (ref 70–99)
Potassium: 4.7 mEq/L (ref 3.5–5.1)
Sodium: 139 mEq/L (ref 135–145)
Total Protein: 7.3 g/dL (ref 6.0–8.3)

## 2012-06-07 LAB — HEPATIC FUNCTION PANEL
ALT: 12 U/L (ref 0–53)
Bilirubin, Direct: 0 mg/dL (ref 0.0–0.3)
Total Bilirubin: 0.6 mg/dL (ref 0.3–1.2)

## 2012-06-07 LAB — HEMOGLOBIN A1C: Hgb A1c MFr Bld: 7.6 % — ABNORMAL HIGH (ref 4.6–6.5)

## 2012-06-07 LAB — URIC ACID: Uric Acid, Serum: 8.1 mg/dL — ABNORMAL HIGH (ref 4.0–7.8)

## 2012-06-07 LAB — LDL CHOLESTEROL, DIRECT: Direct LDL: 138.8 mg/dL
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
## 2012-06-10 ENCOUNTER — Encounter: Payer: Self-pay | Admitting: Internal Medicine

## 2012-06-10 DIAGNOSIS — Z0001 Encounter for general adult medical examination with abnormal findings: Secondary | ICD-10-CM | POA: Insufficient documentation

## 2012-06-10 DIAGNOSIS — K4190 Unilateral femoral hernia, without obstruction or gangrene, not specified as recurrent: Secondary | ICD-10-CM | POA: Insufficient documentation

## 2012-06-10 DIAGNOSIS — Z0000 Encounter for general adult medical examination without abnormal findings: Secondary | ICD-10-CM | POA: Insufficient documentation

## 2012-06-10 MED ORDER — ATORVASTATIN CALCIUM 40 MG PO TABS: 40.0000 mg | ORAL_TABLET | Freq: Every day | ORAL | Status: DC

## 2012-06-10 NOTE — Assessment & Plan Note (Signed)
BP Readings from Last 3 Encounters:  06/07/12 130/80  12/22/11 136/88  12/09/11 118/68   adequate control on present medications

## 2012-06-10 NOTE — Assessment & Plan Note (Signed)
Intermittent  Bulge in the right femoral triangle c/w small femoral hernia.  Plan Watchful waiting - for increased size, pain or irreducibility - surgical consult.

## 2012-06-10 NOTE — Assessment & Plan Note (Signed)
No recent flares. Uric acid level 8.1 down from 10.8.  Plan Without more than two flares per year will not add allopurinol.  Low purine diet - patient directed to [email or web address] for detailed information

## 2012-06-10 NOTE — Assessment & Plan Note (Signed)
Having minor flank pain reminiscent of previous kidney stone discomfort.  Plan U/A - if positive for blood - CT kidney stone protocol.  Addendum - U/A negative.

## 2012-06-10 NOTE — Assessment & Plan Note (Signed)
LDL May '12 was at goal of less than 100 for a diabetic, now 139.  Plan Low fat diet  Increase lipitor to 40 mg daily  Repeat lipid panel in 6 months

## 2012-06-10 NOTE — Assessment & Plan Note (Signed)
Lab Results  Component Value Date   HGBA1C 7.6* 06/07/2012   Running higher than goal but below the threshold for changing therapy.   Plan Continue metformin at present dose   Better adherence to no sugar low carb diet.  Repeat A1C 6 months (order entered)

## 2012-06-15 ENCOUNTER — Ambulatory Visit (AMBULATORY_SURGERY_CENTER): Payer: Medicare Other | Admitting: *Deleted

## 2012-06-15 VITALS — Ht 78.0 in | Wt 220.2 lb

## 2012-06-15 DIAGNOSIS — Z1211 Encounter for screening for malignant neoplasm of colon: Secondary | ICD-10-CM

## 2012-06-15 MED ORDER — MOVIPREP 100 G PO SOLR: ORAL | Status: DC

## 2012-06-15 NOTE — Progress Notes (Signed)
No allergies to eggs or soy products 

## 2012-06-19 NOTE — Addendum Note (Signed)
Addended by: Maple Hudson on: 06/19/2012 04:04 PM   Modules accepted: Level of Service

## 2012-06-22 ENCOUNTER — Encounter: Payer: Self-pay | Admitting: Gastroenterology

## 2012-06-22 ENCOUNTER — Ambulatory Visit (AMBULATORY_SURGERY_CENTER): Payer: Medicare Other | Admitting: Gastroenterology

## 2012-06-22 VITALS — BP 137/73 | HR 68 | Temp 96.7°F | Resp 18 | Ht 78.0 in | Wt 220.0 lb

## 2012-06-22 DIAGNOSIS — K635 Polyp of colon: Secondary | ICD-10-CM

## 2012-06-22 DIAGNOSIS — D126 Benign neoplasm of colon, unspecified: Secondary | ICD-10-CM

## 2012-06-22 DIAGNOSIS — Z1211 Encounter for screening for malignant neoplasm of colon: Secondary | ICD-10-CM | POA: Diagnosis not present

## 2012-06-22 DIAGNOSIS — E785 Hyperlipidemia, unspecified: Secondary | ICD-10-CM | POA: Diagnosis not present

## 2012-06-22 DIAGNOSIS — E119 Type 2 diabetes mellitus without complications: Secondary | ICD-10-CM | POA: Diagnosis not present

## 2012-06-22 DIAGNOSIS — I10 Essential (primary) hypertension: Secondary | ICD-10-CM | POA: Diagnosis not present

## 2012-06-22 MED ORDER — SODIUM CHLORIDE 0.9 % IV SOLN: 500.0000 mL | INTRAVENOUS | Status: DC

## 2012-06-22 NOTE — Patient Instructions (Addendum)

## 2012-06-22 NOTE — Op Note (Signed)
[facility] [address] [place], [place]  [address]  COLONOSCOPY PROCEDURE REPORT  PATIENT:  [name], [name]  MR#:  [id number] BIRTHDATE:  1936/04/30, 76 yrs. old  GENDER:  male ENDOSCOPIST:  Barbette Hair. Arlyce Dice, MD REF. BY: PROCEDURE DATE:  06/22/2012 PROCEDURE:  Colonoscopy with snare polypectomy ASA CLASS:  Class II INDICATIONS:  Routine Risk Screening MEDICATIONS:   MAC sedation, administered by CRNA propofol 150mg IV  DESCRIPTION OF PROCEDURE:   After the risks benefits and alternatives of the procedure were thoroughly explained, informed consent was obtained.  Digital rectal exam was performed and revealed no abnormalities.   The LB CF-[id number] endoscope was introduced through the anus and advanced to the cecum, which was identified by both the appendix and ileocecal valve, without limitations.  The quality of the prep was excellent, using MiraLax.  The instrument was then slowly withdrawn as the colon was fully examined. <<PROCEDUREIMAGES>>  FINDINGS:  A sessile polyp was found in the ascending colon. It was 4 mm in size. Polyp was snared without cautery. Retrieval was successful (see image2). snare polyp  A sessile polyp was found in the distal transverse colon. It was 5 mm in size. Polyp was snared without cautery. Retrieval was successful (see image3). snare polyp  This was otherwise a normal examination of the colon (see image1 and image6).   Retroflexed views in the rectum revealed no abnormalities.    The time to cecum =  1) 5.25  minutes. The scope was then withdrawn in  1) 9.50  minutes from the cecum and the procedure completed. COMPLICATIONS:  None ENDOSCOPIC IMPRESSION: 1) 4 mm sessile polyp in the ascending colon 2) 5 mm sessile polyp in the distal transverse colon 3) Otherwise normal examination RECOMMENDATIONS: 1) If the polyp(s) removed today are proven to be adenomatous (pre-cancerous) polyps, you will need a repeat colonoscopy in  5 years. Otherwise you should continue to follow colorectal cancer screening guidelines for "routine risk" patients with colonoscopy in 10 years. You will receive a letter within 1-2 weeks with the results of your biopsy as well as final recommendations. Please call my office if you have not received a letter after 3 weeks. REPEAT EXAM:   You will receive a letter from Dr. Arlyce Dice in 1-2 weeks, after reviewing the final pathology, with followup recommendations.  ______________________________ Barbette Hair Arlyce Dice, MD  CC:  Jacques Navy, MD  n. Rosalie DoctorBarbette Hair. Ameli Sangiovanni at 06/22/2012 10:16 AM  Donivan Scull, [phone]
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
## 2012-06-25 ENCOUNTER — Telehealth: Payer: Self-pay | Admitting: *Deleted

## 2012-06-25 NOTE — Telephone Encounter (Signed)
NO ANSWER, MESSAGE LEFT FOR THE PATIENT. 

## 2012-07-01 ENCOUNTER — Other Ambulatory Visit: Payer: Self-pay | Admitting: Internal Medicine

## 2012-07-02 ENCOUNTER — Encounter: Payer: Self-pay | Admitting: Gastroenterology

## 2012-07-17 ENCOUNTER — Other Ambulatory Visit: Payer: Self-pay | Admitting: *Deleted

## 2012-07-17 MED ORDER — TESTOSTERONE CYPIONATE 100 MG/ML IM SOLN: 200.0000 mg | INTRAMUSCULAR | Status: DC

## 2012-07-17 NOTE — Telephone Encounter (Signed)
MEDICATION REFILL TESTOSTERONE PRINTED AND TO BE SIGNED DR. PLOTNIKOV FOR DR. Debby Bud

## 2012-07-18 MED ORDER — TESTOSTERONE CYPIONATE 100 MG/ML IM SOLN: 200.0000 mg | INTRAMUSCULAR | Status: DC

## 2012-07-20 DIAGNOSIS — M109 Gout, unspecified: Secondary | ICD-10-CM | POA: Diagnosis not present

## 2012-07-20 DIAGNOSIS — M25579 Pain in unspecified ankle and joints of unspecified foot: Secondary | ICD-10-CM | POA: Diagnosis not present

## 2012-08-14 ENCOUNTER — Ambulatory Visit (INDEPENDENT_AMBULATORY_CARE_PROVIDER_SITE_OTHER): Payer: Medicare Other | Admitting: Internal Medicine

## 2012-08-14 ENCOUNTER — Encounter: Payer: Self-pay | Admitting: Internal Medicine

## 2012-08-14 ENCOUNTER — Telehealth: Payer: Self-pay | Admitting: Internal Medicine

## 2012-08-14 VITALS — BP 144/80 | HR 81 | Temp 98.1°F | Resp 16 | Wt 225.0 lb

## 2012-08-14 DIAGNOSIS — M109 Gout, unspecified: Secondary | ICD-10-CM

## 2012-08-14 MED ORDER — FEBUXOSTAT 40 MG PO TABS: 40.0000 mg | ORAL_TABLET | Freq: Every day | ORAL | Status: DC

## 2012-08-14 MED ORDER — COLCHICINE 0.6 MG PO TABS: 0.6000 mg | ORAL_TABLET | Freq: Every day | ORAL | Status: DC

## 2012-08-14 NOTE — Patient Instructions (Addendum)
For a flare of gout - take two colchicine tablets together, may take 1 additional tablet at 2 hrs if the symptoms are not relieved. Take the uloric daily. Come back for lab in 3 weeks to check the uric acid level.

## 2012-08-14 NOTE — Assessment & Plan Note (Signed)
Sept '13: reports several month h/o 3-4 loose stools 4 days a week; mucus in the stool w/o blood; episodes of fecal incontinence. Now with recent flare since he stopped allopurinol due to dizziness.  Plan Rx for colchicine 0.6 mg: sig 2 tabs at onset of attack, 1 tab if needed 2 hours later  Uloric 40 mg once a day with Uric acid level in 3-4 weeks. For uric acid >6 will increase dose to 80 mg daily

## 2012-08-14 NOTE — Telephone Encounter (Signed)
Caller: Ann/Spouse; Patient Name: [name]; PCP: Illene Regulus (Adults only); Best Callback Phone Number: [phone]; Onset of gout started about one month ago and improved for a while after ER visit. Onset of new flare up was 08/10/12.  Areas affected are right elbow, toe and ankle.  Areas are painful with one red and hot area on elbow size of golf ball.  Affected ankle swells during the day.  Rates pain an 8/10 pain scale; Of the areas flared with gout, the elbow is the most concerning. Patient states his preventive medication-Allopurinol causes dizziness and patient has stopped taking it. Currently is out of the Colcrys used during flare ups.  Triaged using Elbow Non-Injury with a disposition to see provider within 4 hours due to new swelling with redness and pain or tenderness in or around joint. Care advice given.  Appointment scheduled for 08/14/12 at 16:15 with Dr. Debby Bud.

## 2012-08-14 NOTE — Progress Notes (Signed)
  Subjective:    Patient ID: [name], male    DOB: May 22, 1936, 76 y.o.   MRN: [id number]  HPI Had a flare of gout right elbow. Resolved. He had stopped taking allopurinol due to dizziness - he did experiment on and off medication to be sure it was the culprit drug. For acute flares he has used colchicine in the past with good results.  PMH, FamHx and SocHx reviewed for any changes and relevance.   Review of Systems System review is negative for any constitutional, cardiac, pulmonary, GI or neuro symptoms or complaints other than as described in the HPI.     Objective:   Physical Exam Filed Vitals:   08/14/12 1714  BP: 144/80  Pulse: 81  Temp: 98.1 F (36.7 C)  Resp: 16   Gen'l- tall, lanky white man in no distress Cor- RRR Pulm - normal respirations MSK - right elbow w/o erythema, calore or dolore       Assessment & Plan:

## 2012-09-06 ENCOUNTER — Other Ambulatory Visit (INDEPENDENT_AMBULATORY_CARE_PROVIDER_SITE_OTHER): Payer: Medicare Other

## 2012-09-06 DIAGNOSIS — E785 Hyperlipidemia, unspecified: Secondary | ICD-10-CM | POA: Diagnosis not present

## 2012-09-06 DIAGNOSIS — M109 Gout, unspecified: Secondary | ICD-10-CM

## 2012-09-06 DIAGNOSIS — E119 Type 2 diabetes mellitus without complications: Secondary | ICD-10-CM | POA: Diagnosis not present

## 2012-09-06 LAB — LIPID PANEL
Cholesterol: 151 mg/dL (ref 0–200)
LDL Cholesterol: 83 mg/dL (ref 0–99)
VLDL: 18.4 mg/dL (ref 0.0–40.0)

## 2012-09-06 LAB — HEPATIC FUNCTION PANEL
Bilirubin, Direct: 0.1 mg/dL (ref 0.0–0.3)
Total Protein: 7 g/dL (ref 6.0–8.3)

## 2012-09-10 ENCOUNTER — Encounter: Payer: Self-pay | Admitting: Internal Medicine

## 2012-09-13 ENCOUNTER — Telehealth: Payer: Self-pay

## 2012-09-13 NOTE — Telephone Encounter (Signed)
Does this mean [name] is having continuous pain, in which case he needs to be seen, or is he having frequent,intermittent flares of gout type pain??   Uric acid 9/26 was 4.8- normal range making gout flares much less likely.

## 2012-09-13 NOTE — Telephone Encounter (Signed)
RETURNED CALL TO PATIENT. STATES HE DOES NOT THINK IT IS GOUT AND SCHEDULED AN APPT. WITH A RHEUMATOLOGIST. [name]

## 2012-09-13 NOTE — Telephone Encounter (Signed)
Pt's spouse called stating pt's gout pain has not improved. She is requesting advisement on medication treatment from MD

## 2012-09-14 ENCOUNTER — Other Ambulatory Visit: Payer: Self-pay | Admitting: Orthopaedic Surgery

## 2012-09-14 DIAGNOSIS — M25571 Pain in right ankle and joints of right foot: Secondary | ICD-10-CM

## 2012-09-14 DIAGNOSIS — M19079 Primary osteoarthritis, unspecified ankle and foot: Secondary | ICD-10-CM | POA: Diagnosis not present

## 2012-09-18 ENCOUNTER — Ambulatory Visit
Admission: RE | Admit: 2012-09-18 | Discharge: 2012-09-18 | Disposition: A | Discharge status: Home / Self Care | Payer: Medicare Other | Source: Ambulatory Visit | Attending: Orthopaedic Surgery | Admitting: Orthopaedic Surgery

## 2012-09-18 DIAGNOSIS — M25571 Pain in right ankle and joints of right foot: Secondary | ICD-10-CM

## 2012-09-18 DIAGNOSIS — M25579 Pain in unspecified ankle and joints of unspecified foot: Secondary | ICD-10-CM | POA: Diagnosis not present

## 2012-09-21 ENCOUNTER — Other Ambulatory Visit: Payer: Self-pay | Admitting: Internal Medicine

## 2012-09-21 DIAGNOSIS — M19079 Primary osteoarthritis, unspecified ankle and foot: Secondary | ICD-10-CM | POA: Diagnosis not present

## 2012-10-04 DIAGNOSIS — Z23 Encounter for immunization: Secondary | ICD-10-CM | POA: Diagnosis not present

## 2012-10-08 ENCOUNTER — Telehealth: Payer: Self-pay | Admitting: *Deleted

## 2012-10-08 ENCOUNTER — Telehealth: Payer: Self-pay | Admitting: Internal Medicine

## 2012-10-08 MED ORDER — FUROSEMIDE 20 MG PO TABS: 20.0000 mg | ORAL_TABLET | Freq: Every day | ORAL | Status: DC

## 2012-10-08 MED ORDER — AMLODIPINE BESYLATE 5 MG PO TABS: 5.0000 mg | ORAL_TABLET | Freq: Every day | ORAL | Status: DC

## 2012-10-08 MED ORDER — LISINOPRIL 10 MG PO TABS: 10.0000 mg | ORAL_TABLET | Freq: Every day | ORAL | Status: DC

## 2012-10-08 NOTE — Telephone Encounter (Signed)
Patient notified of Medication doses changed and sent to [facility] as he reqeusted.

## 2012-10-08 NOTE — Telephone Encounter (Signed)
The medlist is very confusing with several duplications. Please verify the correct medications and doses and then clean up the med list so that there are not duplications that lead to greater confusion. It is fine to refill all his medications

## 2012-10-08 NOTE — Telephone Encounter (Signed)
Caller: Ann/Spouse; Patient Name: [name]; PCP: Illene Regulus (Adults only); Best Callback Phone Number: [phone] Calling to request correct dose of Medications be called into [facility] on [address] [phone]. He is cutting pills in half and is concerned that he is not getting right dose by cutting unevenly each time.  BP = 130/80 this morning but was elevated last night.  He is wanting refill called in of Lininpril 10 mgs 1 tab PO QD. Lasix 20 mgs 1 tab PO QD, Amylodipine 5 mgs PO QD. [facility] [address] Medication Questions Protocol.

## 2012-10-11 DIAGNOSIS — M7020 Olecranon bursitis, unspecified elbow: Secondary | ICD-10-CM | POA: Diagnosis not present

## 2012-10-11 DIAGNOSIS — M25529 Pain in unspecified elbow: Secondary | ICD-10-CM | POA: Diagnosis not present

## 2012-10-30 ENCOUNTER — Ambulatory Visit (INDEPENDENT_AMBULATORY_CARE_PROVIDER_SITE_OTHER): Payer: Medicare Other | Admitting: Internal Medicine

## 2012-10-30 ENCOUNTER — Encounter: Payer: Self-pay | Admitting: Internal Medicine

## 2012-10-30 VITALS — BP 150/92 | HR 88 | Temp 97.5°F | Ht 78.0 in | Wt 222.0 lb

## 2012-10-30 DIAGNOSIS — J029 Acute pharyngitis, unspecified: Secondary | ICD-10-CM | POA: Diagnosis not present

## 2012-10-30 DIAGNOSIS — J309 Allergic rhinitis, unspecified: Secondary | ICD-10-CM

## 2012-10-30 MED ORDER — BENZONATATE 200 MG PO CAPS: 200.0000 mg | ORAL_CAPSULE | Freq: Two times a day (BID) | ORAL | Status: DC | PRN

## 2012-10-30 MED ORDER — FLUTICASONE PROPIONATE 50 MCG/ACT NA SUSP: 2.0000 | Freq: Every day | NASAL | Status: DC

## 2012-10-30 NOTE — Patient Instructions (Addendum)
Allergic Rhinitis  Allergic rhinitis is when the mucous membranes in the nose respond to allergens. Allergens are particles in the air that cause your body to have an allergic reaction. This causes you to release allergic antibodies. Through a chain of events, these eventually cause you to release histamine into the blood stream (hence the use of antihistamines). Although meant to be protective to the body, it is this release that causes your discomfort, such as frequent sneezing, congestion and an itchy runny nose.    CAUSES    The pollen allergens may come from grasses, trees, and weeds. This is seasonal allergic rhinitis, or "hay fever." Other allergens cause year-round allergic rhinitis (perennial allergic rhinitis) such as house dust mite allergen, pet dander and mold spores.    SYMPTOMS     Nasal stuffiness (congestion).   Runny, itchy nose with sneezing and tearing of the eyes.   There is often an itching of the mouth, eyes and ears.  It cannot be cured, but it can be controlled with medications.  DIAGNOSIS    If you are unable to determine the offending allergen, skin or blood testing may find it.  TREATMENT     Avoid the allergen.   Medications and allergy shots (immunotherapy) can help.   Hay fever may often be treated with antihistamines in pill or nasal spray forms. Antihistamines block the effects of histamine. There are over-the-counter medicines that may help with nasal congestion and swelling around the eyes. Check with your caregiver before taking or giving this medicine.  If the treatment above does not work, there are many new medications your caregiver can prescribe. Stronger medications may be used if initial measures are ineffective. Desensitizing injections can be used if medications and avoidance fails. Desensitization is when a patient is given ongoing shots until the body becomes less sensitive to the allergen. Make sure you follow up with your caregiver if problems continue.   SEEK MEDICAL CARE IF:     You develop fever (more than 100.5 F (38.1 C).   You develop a cough that does not stop easily (persistent).   You have shortness of breath.   You start wheezing.   Symptoms interfere with normal daily activities.  Document Released: 08/23/2001 Document Revised: 02/20/2012 Document Reviewed: 03/04/2009  Exit[employer] Patient Information 2013 [employer].

## 2012-10-30 NOTE — Progress Notes (Signed)
Subjective:    Patient ID: [name], male    DOB: 24-Sep-1936, 76 y.o.   MRN: [id number]  HPI  Pt presents to the clinic today with c/o ear pain, dizziness and sore throat. This began approximately 3 days ago. He felt like it was related to his allergies so he started taking Claritin, but has not had any relief.  The sore throat is constant 2/10 but seems worse at night because the sputum dripping in the back of his throat is making him cough. He is concerned that he may have an ear infection. He does have a history of allergies but no asthma.  Review of Systems      Past Medical History  Diagnosis Date  . Gout   . Diabetes mellitus, type 2   . Hyperlipidemia   . Hypertension   . CLAUDICATION 08/12/2009  . DIABETES MELLITUS, TYPE II 08/09/2007  . ERECTILE DYSFUNCTION 02/10/2009  . FLANK PAIN, LEFT 07/05/2010  . GOUT 08/09/2007  . HYPERLIPIDEMIA 08/09/2007  . HYPERTENSION 08/09/2007  . NEPHROLITHIASIS 07/05/2010  . 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 ROTATOR CUFF SYNDROME, RIGHT 08/11/2008    Current Outpatient Prescriptions  Medication Sig Dispense Refill  . amLODipine (NORVASC) 5 MG tablet Take 1 tablet (5 mg total) by mouth daily.  30 tablet  10  . B-D 3CC LUER-LOK SYR 22GX1" 22G X 1" 3 ML MISC USE TWICE A WEEK WITH TESTOSTERONE  100 each  5  . colchicine 0.6 MG tablet Take 1 tablet (0.6 mg total) by mouth daily.  6 tablet  5  . febuxostat (ULORIC) 40 MG tablet Take 1 tablet (40 mg total) by mouth daily.  30 tablet  11  . furosemide (LASIX) 20 MG tablet Take 1 tablet (20 mg total) by mouth daily.  30 tablet  10  . lisinopril (PRINIVIL,ZESTRIL) 10 MG tablet Take 1 tablet (10 mg total) by mouth daily.  30 tablet  10  . metFORMIN (GLUCOPHAGE) 1000 MG tablet Take 1,000 mg by mouth daily.      . ONE TOUCH ULTRA TEST test strip CHECK BLOOD SUGAR ONCE A DAY  50 each  2  . testosterone cypionate (DEPOTESTOTERONE CYPIONATE) 100 MG/ML injection Inject 2 mLs (200 mg total) into the muscle every  14 (fourteen) days. For IM use only  10 mL  3  . VIAGRA 100 MG tablet TAKE AS NEEDED  6 tablet  0    Allergies  Allergen Reactions  . Labetalol Hcl     REACTION: Throat itching and swelling    Family History  Problem Relation Age of Onset  . Cancer Mother     ovarian  . Cancer Father     prostate  . Diabetes Neg Hx   . Heart disease Neg Hx   . Hypertension Neg Hx   . Colon cancer Neg Hx   . Esophageal cancer Neg Hx   . Rectal cancer Neg Hx   . Stomach cancer Neg Hx     History   Social History  . Marital Status: Married    Spouse Name: N/A    Number of Children: 2  . Years of Education: 12   Occupational History  . farmer     retired   Social History Main Topics  . Smoking status: Former Games developer  . Smokeless tobacco: Never Used     Comment: social smoker  . Alcohol Use: Yes     Comment: occasional  . Drug Use: No  . Sexually Active:  Yes -- Male partner(s)   Other Topics Concern  . Not on file   Social History Narrative   HSG, Occupation:retired tobacco farmer. Married '63-happily: widowed in '09 wife Karie Kirks) succumbed to vasculitis. Remarried  Fall '12. 2 daughters - '65, '67; 3 grandchildren. gardner and remains active     Constitutional: Denies fever, malaise, fatigue, headache or abrupt weight changes.  HEENT: Pt reports ear pain, runny nose and sore throat. Denies eye pain, eye redness, ringing in the ears, wax buildup, nasal congestion, bloody nose. Respiratory: Denies difficulty breathing, shortness of breath, cough or sputum production.   Cardiovascular: Denies chest pain, chest tightness, palpitations or swelling in the hands or feet.  Neurological: Pt reports intermitent dizziness. Denies difficulty with memory, difficulty with speech or problems with balance and coordination.   No other specific complaints in a complete review of systems (except as listed in HPI above).  Objective:   Physical Exam  Wt Readings from Last 3 Encounters:    08/14/12 225 lb (102.059 kg)  06/22/12 220 lb (99.791 kg)  06/15/12 220 lb 3.2 oz (99.882 kg)    General: Appears his stated age, well developed, well nourished in NAD. HEENT: TM's intact, normal light reflex, fluid noted behind TM. Nasal turbinates boggy with clear drainage. + PND. Throat erythematous with lesions, ulcerations or exudate. Cardiovascular: Normal rate and rhythm. S1,S2 noted.  No murmur, rubs or gallops noted. No JVD or BLE edema. No carotid bruits noted. Pulmonary/Chest: Normal effort and positive vesicular breath sounds. No respiratory distress. No wheezes, rales or ronchi noted.        Assessment & Plan:   Allergic Rhinitis, new onset with additional workup required: Cough:  Drink plenty of fluids Continue taking OTC Claritin daily eRx sent in for Flonase eRx sent in for tessalon pearls  RTC as needed or if symptoms persist

## 2013-08-03 ENCOUNTER — Other Ambulatory Visit: Payer: Self-pay | Admitting: Internal Medicine

## 2013-08-06 ENCOUNTER — Other Ambulatory Visit: Payer: Self-pay | Admitting: Internal Medicine

## 2013-08-06 NOTE — Telephone Encounter (Signed)
Depo testosterone called to pharmacy

## 2013-08-07 DIAGNOSIS — L738 Other specified follicular disorders: Secondary | ICD-10-CM | POA: Diagnosis not present

## 2013-08-07 DIAGNOSIS — L570 Actinic keratosis: Secondary | ICD-10-CM | POA: Diagnosis not present

## 2013-09-20 ENCOUNTER — Other Ambulatory Visit: Payer: Self-pay | Admitting: Internal Medicine

## 2013-09-20 DIAGNOSIS — Z23 Encounter for immunization: Secondary | ICD-10-CM | POA: Diagnosis not present

## 2013-10-14 DIAGNOSIS — H9319 Tinnitus, unspecified ear: Secondary | ICD-10-CM | POA: Diagnosis not present

## 2013-10-14 DIAGNOSIS — H905 Unspecified sensorineural hearing loss: Secondary | ICD-10-CM | POA: Diagnosis not present

## 2013-10-15 DIAGNOSIS — E119 Type 2 diabetes mellitus without complications: Secondary | ICD-10-CM | POA: Diagnosis not present

## 2013-10-15 DIAGNOSIS — H25019 Cortical age-related cataract, unspecified eye: Secondary | ICD-10-CM | POA: Diagnosis not present

## 2013-10-15 DIAGNOSIS — H2510 Age-related nuclear cataract, unspecified eye: Secondary | ICD-10-CM | POA: Diagnosis not present

## 2013-10-30 DIAGNOSIS — H903 Sensorineural hearing loss, bilateral: Secondary | ICD-10-CM | POA: Diagnosis not present

## 2013-10-31 ENCOUNTER — Encounter: Payer: Self-pay | Admitting: Internal Medicine

## 2013-11-04 ENCOUNTER — Other Ambulatory Visit: Payer: Self-pay | Admitting: Internal Medicine

## 2013-11-20 ENCOUNTER — Encounter: Payer: Medicare Other | Admitting: Internal Medicine

## 2013-12-04 ENCOUNTER — Ambulatory Visit (INDEPENDENT_AMBULATORY_CARE_PROVIDER_SITE_OTHER): Payer: Medicare Other | Admitting: Internal Medicine

## 2013-12-04 ENCOUNTER — Encounter: Payer: Self-pay | Admitting: Internal Medicine

## 2013-12-04 ENCOUNTER — Ambulatory Visit (INDEPENDENT_AMBULATORY_CARE_PROVIDER_SITE_OTHER): Payer: Medicare Other

## 2013-12-04 VITALS — BP 150/90 | HR 73 | Temp 97.6°F | Ht 78.0 in | Wt 233.8 lb

## 2013-12-04 DIAGNOSIS — I739 Peripheral vascular disease, unspecified: Secondary | ICD-10-CM

## 2013-12-04 DIAGNOSIS — E785 Hyperlipidemia, unspecified: Secondary | ICD-10-CM

## 2013-12-04 DIAGNOSIS — M109 Gout, unspecified: Secondary | ICD-10-CM

## 2013-12-04 DIAGNOSIS — F528 Other sexual dysfunction not due to a substance or known physiological condition: Secondary | ICD-10-CM

## 2013-12-04 DIAGNOSIS — E119 Type 2 diabetes mellitus without complications: Secondary | ICD-10-CM

## 2013-12-04 DIAGNOSIS — Z0000 Encounter for general adult medical examination without abnormal findings: Secondary | ICD-10-CM | POA: Diagnosis not present

## 2013-12-04 DIAGNOSIS — Z23 Encounter for immunization: Secondary | ICD-10-CM

## 2013-12-04 DIAGNOSIS — I10 Essential (primary) hypertension: Secondary | ICD-10-CM | POA: Diagnosis not present

## 2013-12-04 LAB — COMPREHENSIVE METABOLIC PANEL
ALT: 18 U/L (ref 0–53)
Albumin: 4.3 g/dL (ref 3.5–5.2)
CO2: 30 mEq/L (ref 19–32)
Calcium: 9.7 mg/dL (ref 8.4–10.5)
Chloride: 100 mEq/L (ref 96–112)
Creatinine, Ser: 1.4 mg/dL (ref 0.4–1.5)
GFR: 54.68 mL/min — ABNORMAL LOW (ref >60.00)
Sodium: 137 mEq/L (ref 135–145)
Total Protein: 7.5 g/dL (ref 6.0–8.3)

## 2013-12-04 LAB — LIPID PANEL
Cholesterol: 287 mg/dL — ABNORMAL HIGH (ref 0–200)
HDL: 47.3 mg/dL (ref >39.00)
Total CHOL/HDL Ratio: 6
Triglycerides: 313 mg/dL — ABNORMAL HIGH (ref 0.0–149.0)
VLDL: 62.6 mg/dL — ABNORMAL HIGH (ref 0.0–40.0)

## 2013-12-04 LAB — LDL CHOLESTEROL, DIRECT: Direct LDL: 205.5 mg/dL

## 2013-12-04 NOTE — Assessment & Plan Note (Signed)
No complaint at today's visit

## 2013-12-04 NOTE — Assessment & Plan Note (Signed)
Stable with no complaints of claudication at today's visit. Ambulation limited to a mile by knee pain.

## 2013-12-04 NOTE — Assessment & Plan Note (Signed)
Patient stopped "Statin" therapy. Last TC 151; LDL 83 - he thinks this was off medication.  Plan Lab: lipid panel with recommendations to follow

## 2013-12-04 NOTE — Assessment & Plan Note (Signed)
Lab Results  Component Value Date   HGBA1C 7.4* 09/06/2012   Due for follow up A1C with recommendations to follow

## 2013-12-04 NOTE — Patient Instructions (Signed)
Thanks for coming in to see me  YOu look great. Your exam is normal except for the numb feet.  Will get routine lab today - results will be posted to MyChart or by letter.  Immunizations - current and you were given Prevnar pneumonia vaccine today.  Have a very Merry Christmas and a safe/healthy New Year.

## 2013-12-04 NOTE — Assessment & Plan Note (Signed)
Interval history is negative for any major problems. Physical exam is normal. Lab ordered and pending. He is current with colorectal cancer screening. He has aged out of Prostate screening. Immunizations are up to date with Prevnar given today.  In summary A very nice man who appears to be medically stable. Recommendations re: chronic diseases pending lab results
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
## 2013-12-04 NOTE — Progress Notes (Signed)
Pre visit review using our clinic review tool, if applicable. No additional management support is needed unless otherwise documented below in the visit note. 

## 2013-12-04 NOTE — Assessment & Plan Note (Signed)
BP Readings from Last 3 Encounters:  12/04/13 150/90  10/30/12 150/92  08/14/12 144/80   He reports better control at home NCEP cardiac risk calculations: dropped from 24% to 18% if SPB were perfect.  Plan Continue present medications

## 2013-12-04 NOTE — Assessment & Plan Note (Signed)
No report of gout flare in the interval since his last visit  Plan Continue allopurinol  Lab: uric acid level

## 2013-12-06 LAB — HEMOGLOBIN A1C: Hgb A1c MFr Bld: 8.1 % — ABNORMAL HIGH (ref 4.6–6.5)

## 2013-12-16 ENCOUNTER — Other Ambulatory Visit: Payer: Self-pay | Admitting: Internal Medicine

## 2013-12-16 NOTE — Telephone Encounter (Signed)
Should patient be on this I do not see on med list

## 2014-01-20 ENCOUNTER — Other Ambulatory Visit: Payer: Self-pay | Admitting: Internal Medicine

## 2014-02-20 ENCOUNTER — Encounter: Payer: Self-pay | Admitting: Internal Medicine

## 2014-02-20 ENCOUNTER — Ambulatory Visit (INDEPENDENT_AMBULATORY_CARE_PROVIDER_SITE_OTHER): Payer: Medicare Other | Admitting: Internal Medicine

## 2014-02-20 ENCOUNTER — Other Ambulatory Visit (INDEPENDENT_AMBULATORY_CARE_PROVIDER_SITE_OTHER): Payer: Medicare Other

## 2014-02-20 VITALS — BP 150/90 | HR 105 | Temp 98.0°F | Ht 78.0 in | Wt 237.4 lb

## 2014-02-20 DIAGNOSIS — I10 Essential (primary) hypertension: Secondary | ICD-10-CM

## 2014-02-20 DIAGNOSIS — E785 Hyperlipidemia, unspecified: Secondary | ICD-10-CM

## 2014-02-20 DIAGNOSIS — E119 Type 2 diabetes mellitus without complications: Secondary | ICD-10-CM

## 2014-02-20 DIAGNOSIS — M109 Gout, unspecified: Secondary | ICD-10-CM | POA: Diagnosis not present

## 2014-02-20 LAB — LIPID PANEL
CHOL/HDL RATIO: 3
CHOLESTEROL: 194 mg/dL (ref 0–200)
HDL: 59.5 mg/dL (ref >39.00)
LDL Cholesterol: 89 mg/dL (ref 0–99)
TRIGLYCERIDES: 227 mg/dL — AB (ref 0.0–149.0)
VLDL: 45.4 mg/dL — AB (ref 0.0–40.0)

## 2014-02-20 LAB — HEMOGLOBIN A1C: Hgb A1c MFr Bld: 7.8 % — ABNORMAL HIGH (ref 4.6–6.5)

## 2014-02-20 LAB — COMPREHENSIVE METABOLIC PANEL
ALK PHOS: 95 U/L (ref 39–117)
ALT: 27 U/L (ref 0–53)
AST: 19 U/L (ref 0–37)
Albumin: 4.5 g/dL (ref 3.5–5.2)
BILIRUBIN TOTAL: 0.6 mg/dL (ref 0.3–1.2)
BUN: 40 mg/dL — ABNORMAL HIGH (ref 6–23)
CO2: 27 mEq/L (ref 19–32)
Calcium: 9.9 mg/dL (ref 8.4–10.5)
Chloride: 106 mEq/L (ref 96–112)
Creatinine, Ser: 1.5 mg/dL (ref 0.4–1.5)
GFR: 47.3 mL/min — ABNORMAL LOW (ref >60.00)
Glucose, Bld: 126 mg/dL — ABNORMAL HIGH (ref 70–99)
Potassium: 5.4 mEq/L — ABNORMAL HIGH (ref 3.5–5.1)
Sodium: 140 mEq/L (ref 135–145)
Total Protein: 7.4 g/dL (ref 6.0–8.3)

## 2014-02-20 NOTE — Patient Instructions (Signed)
It is good to see you. I hope to enjoy retirement as much as you have.  Will check your diabetes and cholesterol today with results posted to MyChart with recommendations based on the results.  Immunizations - you will be due for Prevnar 13, a companion pneumonia vaccine, in January '16. You will be due for a tetanus booster in March '16. You are current with the shingles vaccine. With normal colonoscopy at 89 you will not need a repeat study unless you have a change in your bowel habit.  For on -going care you will be transferred to Dr. Cathlean Cower.

## 2014-02-20 NOTE — Progress Notes (Signed)
Subjective:    Patient ID: [name], male    DOB: 16-May-1936, 78 y.o.   MRN: [id number]  HPI Mr. Shorts presents follow up of diabetes. He is feeling well and doing well. His last A1C Dev 24th was 8.1%. He reports CBGs 130-150s. He has had no hypoglycemic events and is following a good diet.   Last LDL was 205 Dec 24th, '14!!!! He has been taking atorvastatin 40 mg daily  He reports BP at home is in the 120's/70s but is high here - white coat hypertension. He does monitor twice a day due to episodes of hypotension.    PMH, FamHx and SocHx reviewed for any changes and relevance.  Current Outpatient Prescriptions on File Prior to Visit  Medication Sig Dispense Refill  . amLODipine (NORVASC) 5 MG tablet TAKE 1 TABLET BY MOUTH DAILY  30 tablet  7  . atorvastatin (LIPITOR) 40 MG tablet TAKE 1 TABLET BY MOUTH DAILY  30 tablet  0  . atorvastatin (LIPITOR) 40 MG tablet TAKE 1 TABLET BY MOUTH DAILY  30 tablet  5  . B-D 3CC LUER-LOK SYR 22GX1" 22G X 1" 3 ML MISC USE TWICE A WEEK WITH TESTOSTERONE  100 each  1  . benzonatate (TESSALON) 200 MG capsule Take 1 capsule (200 mg total) by mouth 2 (two) times daily as needed for cough.  20 capsule  0  . furosemide (LASIX) 20 MG tablet TAKE 1 TABLET (20 MG TOTAL) BY MOUTH DAILY.  30 tablet  7  . lisinopril (PRINIVIL,ZESTRIL) 10 MG tablet TAKE 1 TABLET (10 MG TOTAL) BY MOUTH DAILY.  30 tablet  7  . metFORMIN (GLUCOPHAGE) 1000 MG tablet TAKE 1 TABLET BY MOUTH TWICE A DAY  60 tablet  3  . ONE TOUCH ULTRA TEST test strip CHECK BLOOD SUGAR ONCE A DAY  100 each  0  . testosterone cypionate (DEPOTESTOTERONE CYPIONATE) 200 MG/ML injection INJECT 1 ML IM EVERY 14 DAYS  10 mL  1  . VIAGRA 100 MG tablet TAKE AS NEEDED  6 tablet  0  . colchicine 0.6 MG tablet Take 1 tablet (0.6 mg total) by mouth daily.  6 tablet  5   No current facility-administered medications on file prior to visit.      Review of Systems System review is negative for any  constitutional, cardiac, pulmonary, GI or neuro symptoms or complaints other than as described in the HPI.     Objective:   Physical Exam Filed Vitals:   02/20/14 1413  BP: 150/90  Pulse: 105  Temp: 98 F (36.7 C)   Body mass index is 27.44 kg/(m^2). BP Readings from Last 3 Encounters:  02/20/14 150/90  12/04/13 150/90  10/30/12 150/92   Gen'l - tall lanky man in no distress HEENT- C&S clear, PERRLA Cor- RRR Pulm - normal respirations Neuro - A&O x 3, normal gait and station.  Recent Results (from the past 2160 hour(s))  HEMOGLOBIN A1C     Status: Abnormal   Collection Time    12/04/13 10:51 AM      Result Value Ref Range   Hemoglobin A1C 8.1 (*) 4.6 - 6.5 %   Comment: Glycemic Control Guidelines for People with Diabetes:Non Diabetic:  <6%Goal of Therapy: <7%Additional Action Suggested:  >8%   COMPREHENSIVE METABOLIC PANEL     Status: Abnormal   Collection Time    12/04/13 10:51 AM      Result Value Ref Range   Sodium 137  135 -  145 mEq/L   Potassium 5.2 (*) 3.5 - 5.1 mEq/L   Chloride 100  96 - 112 mEq/L   CO2 30  19 - 32 mEq/L   Glucose, Bld 168 (*) 70 - 99 mg/dL   BUN 28 (*) 6 - 23 mg/dL   Creatinine, Ser 1.4  0.4 - 1.5 mg/dL   Total Bilirubin 0.6  0.3 - 1.2 mg/dL   Alkaline Phosphatase 96  39 - 117 U/L   AST 21  0 - 37 U/L   ALT 18  0 - 53 U/L   Total Protein 7.5  6.0 - 8.3 g/dL   Albumin 4.3  3.5 - 5.2 g/dL   Calcium 9.7  8.4 - 10.5 mg/dL   GFR 54.68 (*) >60.00 mL/min  LIPID PANEL     Status: Abnormal   Collection Time    12/04/13 10:51 AM      Result Value Ref Range   Cholesterol 287 (*) 0 - 200 mg/dL   Comment: ATP III Classification       Desirable:  < 200 mg/dL               Borderline High:  200 - 239 mg/dL          High:  > = 240 mg/dL   Triglycerides 313.0 (*) 0.0 - 149.0 mg/dL   Comment: Normal:  <150 mg/dLBorderline High:  150 - 199 mg/dL   HDL 47.30  >39.00 mg/dL   VLDL 62.6 (*) 0.0 - 40.0 mg/dL   Total CHOL/HDL Ratio 6     Comment:                 Men          Women1/2 Average Risk     3.4          3.3Average Risk          5.0          4.42X Average Risk          9.6          7.13X Average Risk          15.0          11.0                      URIC ACID     Status: Abnormal   Collection Time    12/04/13 10:51 AM      Result Value Ref Range   Uric Acid, Serum 9.2 (*) 4.0 - 7.8 mg/dL  LDL CHOLESTEROL, DIRECT     Status: None   Collection Time    12/04/13 10:51 AM      Result Value Ref Range   Direct LDL 205.5     Comment: Optimal:  <100 mg/dLNear or Above Optimal:  100-129 mg/dLBorderline High:  130-159 mg/dLHigh:  160-189 mg/dLVery High:  >190 mg/dL  HEMOGLOBIN A1C     Status: Abnormal   Collection Time    02/20/14  3:04 PM      Result Value Ref Range   Hemoglobin A1C 7.8 (*) 4.6 - 6.5 %   Comment: Glycemic Control Guidelines for People with Diabetes:Non Diabetic:  <6%Goal of Therapy: <7%Additional Action Suggested:  >8%   COMPREHENSIVE METABOLIC PANEL     Status: Abnormal   Collection Time    02/20/14  3:04 PM      Result Value Ref Range   Sodium 140  135 - 145 mEq/L   Potassium  5.4 (*) 3.5 - 5.1 mEq/L   Chloride 106  96 - 112 mEq/L   CO2 27  19 - 32 mEq/L   Glucose, Bld 126 (*) 70 - 99 mg/dL   BUN 40 (*) 6 - 23 mg/dL   Creatinine, Ser 1.5  0.4 - 1.5 mg/dL   Total Bilirubin 0.6  0.3 - 1.2 mg/dL   Alkaline Phosphatase 95  39 - 117 U/L   AST 19  0 - 37 U/L   ALT 27  0 - 53 U/L   Total Protein 7.4  6.0 - 8.3 g/dL   Albumin 4.5  3.5 - 5.2 g/dL   Calcium 9.9  8.4 - 10.5 mg/dL   GFR 47.30 (*) >60.00 mL/min  LIPID PANEL     Status: Abnormal   Collection Time    02/20/14  3:04 PM      Result Value Ref Range   Cholesterol 194  0 - 200 mg/dL   Comment: ATP III Classification       Desirable:  < 200 mg/dL               Borderline High:  200 - 239 mg/dL          High:  > = 240 mg/dL   Triglycerides 227.0 (*) 0.0 - 149.0 mg/dL   Comment: Normal:  <150 mg/dLBorderline High:  150 - 199 mg/dL   HDL 59.50  >39.00 mg/dL   VLDL  45.4 (*) 0.0 - 40.0 mg/dL   LDL Cholesterol 89  0 - 99 mg/dL   Total CHOL/HDL Ratio 3     Comment:                Men          Women1/2 Average Risk     3.4          3.3Average Risk          5.0          4.42X Average Risk          9.6          7.13X Average Risk          15.0          11.0                            Assessment & Plan:  g

## 2014-02-20 NOTE — Progress Notes (Signed)
Pre visit review using our clinic review tool, if applicable. No additional management support is needed unless otherwise documented below in the visit note. 

## 2014-02-23 MED ORDER — SAXAGLIPTIN HCL 5 MG PO TABS: 5.0000 mg | ORAL_TABLET | Freq: Every day | ORAL | Status: DC

## 2014-02-23 NOTE — Assessment & Plan Note (Signed)
Elevated readings in the office but the patient reports good control at home. He has a delayed med administration schedule to avoid episodes of hypotension. Last Bmet normal except for elevated BUN  Plan Continue present regimen  Monitor BP at home: call for SBP consistently 150 or greater.

## 2014-02-23 NOTE — Assessment & Plan Note (Signed)
Lab Results  Component Value Date   HGBA1C 7.8* 02/20/2014   Suboptimal control on metformin 1000 mg daily  Plan Continue metformin  Add onglyza 5 mg daily or other covered DPP4 (patient advised via [place])

## 2014-02-23 NOTE — Assessment & Plan Note (Signed)
Taking and tolerating "statin" therapy. Lab reveals normal LFTs and LDL at goal of 100 or less.  Plan Continue present regimen.

## 2014-02-23 NOTE — Assessment & Plan Note (Signed)
No report of gout flares. Had lower uric acid level on allopurinol but this is not an active medication at this time  Plan For recurrent gout > 2 episodes/12 months restart allopurinol with initial 28 days being covered with colchicine.

## 2014-03-20 ENCOUNTER — Other Ambulatory Visit: Payer: Self-pay

## 2014-05-15 ENCOUNTER — Other Ambulatory Visit: Payer: Self-pay

## 2014-05-15 MED ORDER — TESTOSTERONE CYPIONATE 200 MG/ML IM SOLN: INTRAMUSCULAR | Status: DC

## 2014-05-15 NOTE — Telephone Encounter (Signed)
Done hardcopy to robin  

## 2014-05-15 NOTE — Telephone Encounter (Signed)
[facility]  

## 2014-07-15 ENCOUNTER — Other Ambulatory Visit: Payer: Self-pay

## 2014-07-15 ENCOUNTER — Other Ambulatory Visit: Payer: Self-pay | Admitting: Internal Medicine

## 2014-07-15 MED ORDER — METFORMIN HCL 1000 MG PO TABS: 1000.0000 mg | ORAL_TABLET | Freq: Two times a day (BID) | ORAL | Status: DC

## 2014-07-15 NOTE — Telephone Encounter (Signed)
Called the pateint and he did mean the injection which was sent in.

## 2014-07-15 NOTE — Telephone Encounter (Signed)
Pt request refill for testosterone in the valve? (all in one) to be [facility] [address]. Please advise.

## 2014-07-15 NOTE — Telephone Encounter (Signed)
I think he might mean the testosterone pump - please clarify, and see if he has been tx , and the dosing for this if possible

## 2014-07-16 MED ORDER — TESTOSTERONE CYPIONATE 200 MG/ML IM SOLN: INTRAMUSCULAR | Status: DC

## 2014-07-16 NOTE — Telephone Encounter (Signed)
Done hardcopy to robin  

## 2014-07-16 NOTE — Addendum Note (Signed)
Addended by: Biagio Borg on: 07/16/2014 04:05 PM   Modules accepted: Orders

## 2014-07-16 NOTE — Telephone Encounter (Signed)
Faxed [facility]copy to [facility] [address] [place]. And called the patient spoke to his wife Webb Silversmith and informed

## 2014-07-16 NOTE — Addendum Note (Signed)
Addended by: Sharon Seller B on: 07/16/2014 01:34 PM   Modules accepted: Orders

## 2014-07-22 ENCOUNTER — Encounter: Payer: Self-pay | Admitting: Internal Medicine

## 2014-07-22 ENCOUNTER — Ambulatory Visit (INDEPENDENT_AMBULATORY_CARE_PROVIDER_SITE_OTHER): Payer: Medicare Other | Admitting: Internal Medicine

## 2014-07-22 ENCOUNTER — Other Ambulatory Visit: Payer: Self-pay | Admitting: Internal Medicine

## 2014-07-22 ENCOUNTER — Other Ambulatory Visit (INDEPENDENT_AMBULATORY_CARE_PROVIDER_SITE_OTHER): Payer: Medicare Other

## 2014-07-22 VITALS — BP 140/70 | HR 82 | Temp 98.1°F | Ht 77.0 in | Wt 237.5 lb

## 2014-07-22 DIAGNOSIS — E119 Type 2 diabetes mellitus without complications: Secondary | ICD-10-CM | POA: Diagnosis not present

## 2014-07-22 DIAGNOSIS — J309 Allergic rhinitis, unspecified: Secondary | ICD-10-CM | POA: Diagnosis not present

## 2014-07-22 DIAGNOSIS — E785 Hyperlipidemia, unspecified: Secondary | ICD-10-CM | POA: Diagnosis not present

## 2014-07-22 DIAGNOSIS — I10 Essential (primary) hypertension: Secondary | ICD-10-CM | POA: Diagnosis not present

## 2014-07-22 HISTORY — DX: Allergic rhinitis, unspecified: J30.9

## 2014-07-22 LAB — BASIC METABOLIC PANEL
BUN: 27 mg/dL — AB (ref 6–23)
CALCIUM: 9.3 mg/dL (ref 8.4–10.5)
CHLORIDE: 102 meq/L (ref 96–112)
CO2: 25 mEq/L (ref 19–32)
CREATININE: 1.7 mg/dL — AB (ref 0.4–1.5)
GFR: 41.84 mL/min — ABNORMAL LOW (ref >60.00)
Glucose, Bld: 133 mg/dL — ABNORMAL HIGH (ref 70–99)
Potassium: 5 mEq/L (ref 3.5–5.1)
Sodium: 137 mEq/L (ref 135–145)

## 2014-07-22 LAB — CBC WITH DIFFERENTIAL/PLATELET
BASOS PCT: 0.4 % (ref 0.0–3.0)
Basophils Absolute: 0 10*3/uL (ref 0.0–0.1)
EOS PCT: 6 % — AB (ref 0.0–5.0)
Eosinophils Absolute: 0.6 10*3/uL (ref 0.0–0.7)
HCT: 38.7 % — ABNORMAL LOW (ref 39.0–52.0)
HEMOGLOBIN: 12.8 g/dL — AB (ref 13.0–17.0)
Lymphocytes Relative: 18.1 % (ref 12.0–46.0)
Lymphs Abs: 1.9 10*3/uL (ref 0.7–4.0)
MCHC: 33.1 g/dL (ref 30.0–36.0)
MCV: 88.8 fl (ref 78.0–100.0)
MONO ABS: 1 10*3/uL (ref 0.1–1.0)
Monocytes Relative: 9.4 % (ref 3.0–12.0)
NEUTROS ABS: 6.9 10*3/uL (ref 1.4–7.7)
NEUTROS PCT: 66.1 % (ref 43.0–77.0)
Platelets: 244 10*3/uL (ref 150.0–400.0)
RBC: 4.36 Mil/uL (ref 4.22–5.81)
RDW: 14.2 % (ref 11.5–15.5)
WBC: 10.5 10*3/uL (ref 4.0–10.5)

## 2014-07-22 LAB — HEPATIC FUNCTION PANEL
ALT: 18 U/L (ref 0–53)
AST: 15 U/L (ref 0–37)
Albumin: 4 g/dL (ref 3.5–5.2)
Alkaline Phosphatase: 81 U/L (ref 39–117)
BILIRUBIN TOTAL: 0.7 mg/dL (ref 0.2–1.2)
Bilirubin, Direct: 0.1 mg/dL (ref 0.0–0.3)
Total Protein: 7.1 g/dL (ref 6.0–8.3)

## 2014-07-22 LAB — TSH: TSH: 0.77 u[IU]/mL (ref 0.35–4.50)

## 2014-07-22 LAB — HEMOGLOBIN A1C: HEMOGLOBIN A1C: 7.8 % — AB (ref 4.6–6.5)

## 2014-07-22 LAB — LIPID PANEL
Cholesterol: 133 mg/dL (ref 0–200)
HDL: 38.8 mg/dL — AB (ref >39.00)
LDL CALC: 62 mg/dL (ref 0–99)
NONHDL: 94.2
Total CHOL/HDL Ratio: 3
Triglycerides: 163 mg/dL — ABNORMAL HIGH (ref 0.0–149.0)
VLDL: 32.6 mg/dL (ref 0.0–40.0)

## 2014-07-22 MED ORDER — GLIPIZIDE ER 2.5 MG PO TB24: 2.5000 mg | ORAL_TABLET | Freq: Every day | ORAL | Status: DC

## 2014-07-22 MED ORDER — FEXOFENADINE HCL 180 MG PO TABS: 180.0000 mg | ORAL_TABLET | Freq: Every day | ORAL | Status: DC

## 2014-07-22 MED ORDER — METHYLPREDNISOLONE ACETATE 80 MG/ML IJ SUSP
80.0000 mg | Freq: Once | INTRAMUSCULAR | Status: AC
  Administered 2014-07-22: 80 mg via INTRAMUSCULAR

## 2014-07-22 MED ORDER — FLUTICASONE PROPIONATE 50 MCG/ACT NA SUSP: 2.0000 | Freq: Every day | NASAL | Status: DC

## 2014-07-22 NOTE — Patient Instructions (Signed)
You had the steroid shot today  Please take all new medication as prescribed - the allegra and flonase as needed  Please continue all other medications as before, and refills have been done if requested.  Please have the pharmacy call with any other refills you may need.  Please continue your efforts at being more active, low cholesterol diet, and weight control.  You are otherwise up to date with prevention measures today.  Please keep your appointments with your specialists as you may have planned  Please go to the LAB in the Basement (turn left off the elevator) for the tests to be done today  You will be contacted by phone if any changes need to be made immediately.  Otherwise, you will receive a letter about your results with an explanation, but please check with MyChart first.  Please remember to sign up for MyChart if you have not done so, as this will be important to you in the future with finding out test results, communicating by private email, and scheduling acute appointments online when needed.  Please return in 6 months, or sooner if needed

## 2014-07-22 NOTE — Progress Notes (Signed)
Pre visit review using our clinic review tool, if applicable. No additional management support is needed unless otherwise documented below in the visit note. 
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
## 2014-07-22 NOTE — Assessment & Plan Note (Signed)
Mild to mod, for depomedrol IM, allegra/flonaseasd,  to f/u any worsening symptoms or concerns

## 2014-07-22 NOTE — Assessment & Plan Note (Signed)
stable overall by history and exam, recent data reviewed with pt, and pt to continue medical treatment as before,  to f/u any worsening symptoms or concerns Lab Results  Component Value Date   LDLCALC 89 02/20/2014

## 2014-07-22 NOTE — Assessment & Plan Note (Signed)
stable overall by history and exam, recent data reviewed with pt, and pt to continue medical treatment as before,  to f/u any worsening symptoms or concerns BP Readings from Last 3 Encounters:  07/22/14 140/70  02/20/14 150/90  12/04/13 150/90

## 2014-07-22 NOTE — Assessment & Plan Note (Signed)
stable overall by history and exam, recent data reviewed with pt, and pt to continue medical treatment as before,  to f/u any worsening symptoms or concerns Lab Results  Component Value Date   HGBA1C 7.8* 02/20/2014

## 2014-09-18 ENCOUNTER — Other Ambulatory Visit: Payer: Self-pay

## 2014-09-18 MED ORDER — ATORVASTATIN CALCIUM 40 MG PO TABS: ORAL_TABLET | ORAL | Status: DC

## 2014-09-26 ENCOUNTER — Other Ambulatory Visit: Payer: Self-pay

## 2014-10-14 ENCOUNTER — Other Ambulatory Visit: Payer: Self-pay

## 2014-10-14 MED ORDER — LISINOPRIL 10 MG PO TABS: 10.0000 mg | ORAL_TABLET | Freq: Every day | ORAL | Status: DC

## 2014-10-14 MED ORDER — AMLODIPINE BESYLATE 5 MG PO TABS: 5.0000 mg | ORAL_TABLET | Freq: Every day | ORAL | Status: DC

## 2014-10-20 DIAGNOSIS — Z23 Encounter for immunization: Secondary | ICD-10-CM | POA: Diagnosis not present

## 2014-10-21 ENCOUNTER — Encounter: Payer: Self-pay | Admitting: Internal Medicine

## 2014-10-21 DIAGNOSIS — H2513 Age-related nuclear cataract, bilateral: Secondary | ICD-10-CM | POA: Diagnosis not present

## 2014-10-21 DIAGNOSIS — H25013 Cortical age-related cataract, bilateral: Secondary | ICD-10-CM | POA: Diagnosis not present

## 2014-10-21 DIAGNOSIS — E119 Type 2 diabetes mellitus without complications: Secondary | ICD-10-CM | POA: Diagnosis not present

## 2014-10-21 LAB — HM DIABETES EYE EXAM

## 2014-10-24 ENCOUNTER — Encounter: Payer: Self-pay | Admitting: Internal Medicine

## 2014-11-04 DIAGNOSIS — H903 Sensorineural hearing loss, bilateral: Secondary | ICD-10-CM | POA: Diagnosis not present

## 2014-11-24 ENCOUNTER — Other Ambulatory Visit: Payer: Self-pay | Admitting: Dermatology

## 2014-11-24 DIAGNOSIS — D485 Neoplasm of uncertain behavior of skin: Secondary | ICD-10-CM | POA: Diagnosis not present

## 2014-11-24 DIAGNOSIS — L570 Actinic keratosis: Secondary | ICD-10-CM | POA: Diagnosis not present

## 2014-12-03 ENCOUNTER — Other Ambulatory Visit: Payer: Self-pay | Admitting: Internal Medicine

## 2014-12-09 ENCOUNTER — Other Ambulatory Visit (INDEPENDENT_AMBULATORY_CARE_PROVIDER_SITE_OTHER): Payer: Medicare Other

## 2014-12-09 ENCOUNTER — Encounter: Payer: Self-pay | Admitting: Internal Medicine

## 2014-12-09 ENCOUNTER — Ambulatory Visit (INDEPENDENT_AMBULATORY_CARE_PROVIDER_SITE_OTHER): Payer: Medicare Other | Admitting: Internal Medicine

## 2014-12-09 VITALS — BP 140/80 | HR 106 | Temp 98.1°F | Ht 77.5 in | Wt 237.2 lb

## 2014-12-09 DIAGNOSIS — N183 Chronic kidney disease, stage 3 unspecified: Secondary | ICD-10-CM | POA: Insufficient documentation

## 2014-12-09 DIAGNOSIS — I10 Essential (primary) hypertension: Secondary | ICD-10-CM

## 2014-12-09 DIAGNOSIS — E119 Type 2 diabetes mellitus without complications: Secondary | ICD-10-CM

## 2014-12-09 DIAGNOSIS — E785 Hyperlipidemia, unspecified: Secondary | ICD-10-CM | POA: Diagnosis not present

## 2014-12-09 DIAGNOSIS — N182 Chronic kidney disease, stage 2 (mild): Secondary | ICD-10-CM

## 2014-12-09 LAB — HEPATIC FUNCTION PANEL
ALT: 23 U/L (ref 0–53)
AST: 24 U/L (ref 0–37)
Albumin: 4.3 g/dL (ref 3.5–5.2)
Alkaline Phosphatase: 84 U/L (ref 39–117)
BILIRUBIN DIRECT: 0.1 mg/dL (ref 0.0–0.3)
TOTAL PROTEIN: 7.5 g/dL (ref 6.0–8.3)
Total Bilirubin: 0.5 mg/dL (ref 0.2–1.2)

## 2014-12-09 LAB — BASIC METABOLIC PANEL
BUN: 31 mg/dL — ABNORMAL HIGH (ref 6–23)
CHLORIDE: 106 meq/L (ref 96–112)
CO2: 29 mEq/L (ref 19–32)
CREATININE: 1.4 mg/dL (ref 0.4–1.5)
Calcium: 9.7 mg/dL (ref 8.4–10.5)
GFR: 51.44 mL/min — ABNORMAL LOW (ref >60.00)
Glucose, Bld: 92 mg/dL (ref 70–99)
Potassium: 5.5 mEq/L — ABNORMAL HIGH (ref 3.5–5.1)
Sodium: 141 mEq/L (ref 135–145)

## 2014-12-09 LAB — LIPID PANEL
CHOLESTEROL: 188 mg/dL (ref 0–200)
HDL: 58.6 mg/dL (ref >39.00)
LDL Cholesterol: 90 mg/dL (ref 0–99)
NonHDL: 129.4
Total CHOL/HDL Ratio: 3
Triglycerides: 196 mg/dL — ABNORMAL HIGH (ref 0.0–149.0)
VLDL: 39.2 mg/dL (ref 0.0–40.0)

## 2014-12-09 LAB — HEMOGLOBIN A1C: Hgb A1c MFr Bld: 8 % — ABNORMAL HIGH (ref 4.6–6.5)

## 2014-12-09 MED ORDER — FLUTICASONE PROPIONATE 50 MCG/ACT NA SUSP: 2.0000 | Freq: Every day | NASAL | Status: DC

## 2014-12-09 NOTE — Addendum Note (Signed)
Addended by: Biagio Borg on: 12/09/2014 12:06 PM   Modules accepted: Miquel Dunn

## 2014-12-09 NOTE — Assessment & Plan Note (Signed)
stable overall by history and exam, recent data reviewed with pt, and pt to continue medical treatment as before,  to f/u any worsening symptoms or concerns Lab Results  Component Value Date   LDLCALC 62 07/22/2014

## 2014-12-09 NOTE — Assessment & Plan Note (Signed)
stable overall by history and exam, recent data reviewed with pt, and pt to continue medical treatment as before,  to f/u any worsening symptoms or concerns, for f/u cr today

## 2014-12-09 NOTE — Patient Instructions (Signed)
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
## 2014-12-09 NOTE — Assessment & Plan Note (Addendum)
stable overall by history and exam, recent data reviewed with pt, and pt to continue medical treatment as before,  to f/u any worsening symptoms or concerns BP Readings from Last 3 Encounters:  12/09/14 140/80  07/22/14 140/70  02/20/14 150/90  ECG reviewed as per emr

## 2014-12-09 NOTE — Progress Notes (Signed)
Pre visit review using our clinic review tool, if applicable. No additional management support is needed unless otherwise documented below in the visit note. 

## 2014-12-09 NOTE — Assessment & Plan Note (Signed)
stable overall by history and exam, recent data reviewed with pt, and pt to continue medical treatment as before,  to f/u any worsening symptoms or concerns Lab Results  Component Value Date   HGBA1C 7.8* 07/22/2014   For f/u a1c with new OHA from last visit

## 2014-12-10 ENCOUNTER — Other Ambulatory Visit: Payer: Self-pay | Admitting: Internal Medicine

## 2014-12-10 MED ORDER — GLIPIZIDE ER 10 MG PO TB24: 10.0000 mg | ORAL_TABLET | Freq: Every day | ORAL | Status: DC

## 2015-01-06 ENCOUNTER — Other Ambulatory Visit: Payer: Self-pay

## 2015-01-06 MED ORDER — FUROSEMIDE 20 MG PO TABS: 20.0000 mg | ORAL_TABLET | Freq: Every day | ORAL | Status: DC

## 2015-03-10 ENCOUNTER — Other Ambulatory Visit: Payer: Self-pay | Admitting: Internal Medicine

## 2015-03-27 ENCOUNTER — Telehealth: Payer: Self-pay | Admitting: *Deleted

## 2015-03-27 MED ORDER — TESTOSTERONE CYPIONATE 200 MG/ML IM SOLN: INTRAMUSCULAR | Status: DC

## 2015-03-27 MED ORDER — "SYRINGE/NEEDLE (DISP) 22G X 1"" 3 ML MISC": Status: DC

## 2015-03-27 NOTE — Telephone Encounter (Signed)
[facility].../lmb 

## 2015-03-27 NOTE — Telephone Encounter (Signed)
Received fax pt requesting refills on his testosterone & syringes...[name]

## 2015-03-27 NOTE — Telephone Encounter (Signed)
Done hardcopy to Cherina  

## 2015-06-10 ENCOUNTER — Encounter: Payer: Self-pay | Admitting: Internal Medicine

## 2015-06-10 ENCOUNTER — Other Ambulatory Visit (INDEPENDENT_AMBULATORY_CARE_PROVIDER_SITE_OTHER): Payer: Medicare Other

## 2015-06-10 ENCOUNTER — Ambulatory Visit (INDEPENDENT_AMBULATORY_CARE_PROVIDER_SITE_OTHER): Payer: Medicare Other | Admitting: Internal Medicine

## 2015-06-10 VITALS — BP 166/94 | HR 73 | Temp 97.9°F | Ht 78.0 in | Wt 235.0 lb

## 2015-06-10 DIAGNOSIS — N182 Chronic kidney disease, stage 2 (mild): Secondary | ICD-10-CM

## 2015-06-10 DIAGNOSIS — E119 Type 2 diabetes mellitus without complications: Secondary | ICD-10-CM | POA: Diagnosis not present

## 2015-06-10 DIAGNOSIS — E785 Hyperlipidemia, unspecified: Secondary | ICD-10-CM | POA: Diagnosis not present

## 2015-06-10 DIAGNOSIS — I10 Essential (primary) hypertension: Secondary | ICD-10-CM

## 2015-06-10 LAB — URINALYSIS, ROUTINE W REFLEX MICROSCOPIC
BILIRUBIN URINE: NEGATIVE
HGB URINE DIPSTICK: NEGATIVE
KETONES UR: NEGATIVE
LEUKOCYTES UA: NEGATIVE
Nitrite: NEGATIVE
RBC / HPF: NONE SEEN (ref 0–?)
Specific Gravity, Urine: 1.01 (ref 1.000–1.030)
Total Protein, Urine: NEGATIVE
Urine Glucose: NEGATIVE
Urobilinogen, UA: 0.2 (ref 0.0–1.0)
WBC, UA: NONE SEEN (ref 0–?)
pH: 5 (ref 5.0–8.0)

## 2015-06-10 LAB — CBC WITH DIFFERENTIAL/PLATELET
Basophils Absolute: 0.1 10*3/uL (ref 0.0–0.1)
Basophils Relative: 0.5 % (ref 0.0–3.0)
EOS ABS: 0.9 10*3/uL — AB (ref 0.0–0.7)
EOS PCT: 8.8 % — AB (ref 0.0–5.0)
HEMATOCRIT: 50.3 % (ref 39.0–52.0)
HEMOGLOBIN: 16.4 g/dL (ref 13.0–17.0)
Lymphocytes Relative: 15.1 % (ref 12.0–46.0)
Lymphs Abs: 1.5 10*3/uL (ref 0.7–4.0)
MCHC: 32.6 g/dL (ref 30.0–36.0)
MCV: 88.1 fl (ref 78.0–100.0)
Monocytes Absolute: 1 10*3/uL (ref 0.1–1.0)
Monocytes Relative: 9.7 % (ref 3.0–12.0)
NEUTROS PCT: 65.9 % (ref 43.0–77.0)
Neutro Abs: 6.5 10*3/uL (ref 1.4–7.7)
Platelets: 258 10*3/uL (ref 150.0–400.0)
RBC: 5.71 Mil/uL (ref 4.22–5.81)
RDW: 14.5 % (ref 11.5–15.5)
WBC: 9.9 10*3/uL (ref 4.0–10.5)

## 2015-06-10 LAB — BASIC METABOLIC PANEL
BUN: 25 mg/dL — ABNORMAL HIGH (ref 6–23)
CHLORIDE: 101 meq/L (ref 96–112)
CO2: 28 mEq/L (ref 19–32)
CREATININE: 1.56 mg/dL — AB (ref 0.40–1.50)
Calcium: 9.7 mg/dL (ref 8.4–10.5)
GFR: 46.09 mL/min — ABNORMAL LOW (ref >60.00)
Glucose, Bld: 91 mg/dL (ref 70–99)
Potassium: 4.6 mEq/L (ref 3.5–5.1)
SODIUM: 138 meq/L (ref 135–145)

## 2015-06-10 LAB — LIPID PANEL
CHOL/HDL RATIO: 3
CHOLESTEROL: 132 mg/dL (ref 0–200)
HDL: 40.9 mg/dL (ref >39.00)
LDL Cholesterol: 63 mg/dL (ref 0–99)
NonHDL: 91.1
Triglycerides: 139 mg/dL (ref 0.0–149.0)
VLDL: 27.8 mg/dL (ref 0.0–40.0)

## 2015-06-10 LAB — HEPATIC FUNCTION PANEL
ALBUMIN: 4.5 g/dL (ref 3.5–5.2)
ALT: 11 U/L (ref 0–53)
AST: 15 U/L (ref 0–37)
Alkaline Phosphatase: 106 U/L (ref 39–117)
Bilirubin, Direct: 0 mg/dL (ref 0.0–0.3)
Total Bilirubin: 0.6 mg/dL (ref 0.2–1.2)
Total Protein: 7.8 g/dL (ref 6.0–8.3)

## 2015-06-10 LAB — MICROALBUMIN / CREATININE URINE RATIO
CREATININE, U: 38 mg/dL
Microalb Creat Ratio: 9.2 mg/g (ref 0.0–30.0)
Microalb, Ur: 3.5 mg/dL — ABNORMAL HIGH (ref 0.0–1.9)

## 2015-06-10 LAB — TSH: TSH: 1.51 u[IU]/mL (ref 0.35–4.50)

## 2015-06-10 LAB — HEMOGLOBIN A1C: Hgb A1c MFr Bld: 5.9 % (ref 4.6–6.5)

## 2015-06-10 NOTE — Progress Notes (Signed)
Pre visit review using our clinic review tool, if applicable. No additional management support is needed unless otherwise documented below in the visit note. 

## 2015-06-10 NOTE — Assessment & Plan Note (Signed)
stable overall by history and exam, recent data reviewed with pt, and pt to continue medical treatment as before,  to f/u any worsening symptoms or concerns Lab Results  Component Value Date   LDLCALC 90 12/09/2014

## 2015-06-10 NOTE — Assessment & Plan Note (Signed)
elveated today, pt adamant BP at home is ok, o/w stable overall by history and exam, recent data reviewed with pt, and pt to continue medical treatment as before,  to f/u any worsening symptoms or concerns, to bring BP machine to office next visit BP Readings from Last 3 Encounters:  06/10/15 166/94  12/09/14 140/80  07/22/14 140/70

## 2015-06-10 NOTE — Patient Instructions (Signed)

## 2015-06-10 NOTE — Assessment & Plan Note (Addendum)
stable overall by history and exam, recent data reviewed with pt, and pt to continue medical treatment as before,  to f/u any worsening symptoms or concerns Lab Results  Component Value Date   [place] 90 12/09/2014   For f/u labs
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
## 2015-07-09 DIAGNOSIS — M1711 Unilateral primary osteoarthritis, right knee: Secondary | ICD-10-CM | POA: Diagnosis not present

## 2015-07-22 ENCOUNTER — Other Ambulatory Visit: Payer: Self-pay | Admitting: Dermatology

## 2015-07-22 DIAGNOSIS — C4442 Squamous cell carcinoma of skin of scalp and neck: Secondary | ICD-10-CM | POA: Diagnosis not present

## 2015-07-22 DIAGNOSIS — C44311 Basal cell carcinoma of skin of nose: Secondary | ICD-10-CM | POA: Diagnosis not present

## 2015-07-22 DIAGNOSIS — D485 Neoplasm of uncertain behavior of skin: Secondary | ICD-10-CM | POA: Diagnosis not present

## 2015-07-22 DIAGNOSIS — L570 Actinic keratosis: Secondary | ICD-10-CM | POA: Diagnosis not present

## 2015-08-10 ENCOUNTER — Telehealth: Payer: Self-pay | Admitting: Internal Medicine

## 2015-08-10 NOTE — Telephone Encounter (Signed)
Pt's Spouse, [name], dropped off a surgery clearance form for Dr. Jenny Reichmann to complete ASAP for [name].  He is in extreme pain and needs this clearance completed and faxed to [facility] [phone].  Please call [name] to let her know when it is faxed over to [place].  Ann's cell is [phone].  Form placed in Dr. Gwynn Burly box for pick up.  jsl

## 2015-08-11 NOTE — Telephone Encounter (Signed)
Ann advised, form faxed via personal VM

## 2015-08-11 NOTE — Telephone Encounter (Signed)
Form done 

## 2015-08-19 DIAGNOSIS — M1711 Unilateral primary osteoarthritis, right knee: Secondary | ICD-10-CM | POA: Diagnosis not present

## 2015-08-26 ENCOUNTER — Other Ambulatory Visit (HOSPITAL_COMMUNITY): Payer: Self-pay | Admitting: *Deleted

## 2015-08-27 ENCOUNTER — Encounter (HOSPITAL_COMMUNITY): Payer: Self-pay

## 2015-08-27 ENCOUNTER — Encounter (HOSPITAL_COMMUNITY)
Admission: RE | Admit: 2015-08-27 | Discharge: 2015-08-27 | Disposition: A | Discharge status: Home / Self Care | Payer: Medicare Other | Source: Ambulatory Visit | Attending: Orthopedic Surgery | Admitting: Orthopedic Surgery

## 2015-08-27 ENCOUNTER — Encounter (HOSPITAL_COMMUNITY)
Admission: RE | Admit: 2015-08-27 | Discharge: 2015-08-27 | Disposition: A | Discharge status: Home / Self Care | Payer: Medicare Other | Source: Ambulatory Visit | Attending: Orthopaedic Surgery | Admitting: Orthopaedic Surgery

## 2015-08-27 DIAGNOSIS — E119 Type 2 diabetes mellitus without complications: Secondary | ICD-10-CM | POA: Insufficient documentation

## 2015-08-27 DIAGNOSIS — Z01818 Encounter for other preprocedural examination: Secondary | ICD-10-CM | POA: Diagnosis not present

## 2015-08-27 DIAGNOSIS — Z01812 Encounter for preprocedural laboratory examination: Secondary | ICD-10-CM | POA: Diagnosis not present

## 2015-08-27 DIAGNOSIS — I10 Essential (primary) hypertension: Secondary | ICD-10-CM | POA: Diagnosis not present

## 2015-08-27 DIAGNOSIS — Z87891 Personal history of nicotine dependence: Secondary | ICD-10-CM | POA: Insufficient documentation

## 2015-08-27 DIAGNOSIS — Z79899 Other long term (current) drug therapy: Secondary | ICD-10-CM | POA: Diagnosis not present

## 2015-08-27 DIAGNOSIS — Z0183 Encounter for blood typing: Secondary | ICD-10-CM | POA: Diagnosis not present

## 2015-08-27 DIAGNOSIS — E089 Diabetes mellitus due to underlying condition without complications: Secondary | ICD-10-CM | POA: Diagnosis not present

## 2015-08-27 DIAGNOSIS — E785 Hyperlipidemia, unspecified: Secondary | ICD-10-CM | POA: Diagnosis not present

## 2015-08-27 HISTORY — DX: Unspecified osteoarthritis, unspecified site: M19.90

## 2015-08-27 HISTORY — DX: Malignant (primary) neoplasm, unspecified: C80.1

## 2015-08-27 LAB — URINALYSIS, ROUTINE W REFLEX MICROSCOPIC
BILIRUBIN URINE: NEGATIVE
GLUCOSE, UA: 250 mg/dL — AB
HGB URINE DIPSTICK: NEGATIVE
Ketones, ur: NEGATIVE mg/dL
Leukocytes, UA: NEGATIVE
Nitrite: NEGATIVE
PH: 5 (ref 5.0–8.0)
Protein, ur: NEGATIVE mg/dL
SPECIFIC GRAVITY, URINE: 1.02 (ref 1.005–1.030)
Urobilinogen, UA: 0.2 mg/dL (ref 0.0–1.0)

## 2015-08-27 LAB — CBC WITH DIFFERENTIAL/PLATELET
Basophils Absolute: 0 10*3/uL (ref 0.0–0.1)
Basophils Relative: 0 %
EOS PCT: 4 %
Eosinophils Absolute: 0.5 10*3/uL (ref 0.0–0.7)
HCT: 47.3 % (ref 39.0–52.0)
Hemoglobin: 15.3 g/dL (ref 13.0–17.0)
LYMPHS ABS: 1.9 10*3/uL (ref 0.7–4.0)
LYMPHS PCT: 17 %
MCH: 28.4 pg (ref 26.0–34.0)
MCHC: 32.3 g/dL (ref 30.0–36.0)
MCV: 87.8 fL (ref 78.0–100.0)
MONO ABS: 1 10*3/uL (ref 0.1–1.0)
Monocytes Relative: 9 %
Neutro Abs: 7.8 10*3/uL — ABNORMAL HIGH (ref 1.7–7.7)
Neutrophils Relative %: 70 %
PLATELETS: 270 10*3/uL (ref 150–400)
RBC: 5.39 MIL/uL (ref 4.22–5.81)
RDW: 15.1 % (ref 11.5–15.5)
WBC: 11.2 10*3/uL — ABNORMAL HIGH (ref 4.0–10.5)

## 2015-08-27 LAB — COMPREHENSIVE METABOLIC PANEL
ALT: 20 U/L (ref 17–63)
AST: 21 U/L (ref 15–41)
Albumin: 4.2 g/dL (ref 3.5–5.0)
Alkaline Phosphatase: 113 U/L (ref 38–126)
Anion gap: 8 (ref 5–15)
BUN: 42 mg/dL — ABNORMAL HIGH (ref 6–20)
CHLORIDE: 107 mmol/L (ref 101–111)
CO2: 24 mmol/L (ref 22–32)
Calcium: 9.7 mg/dL (ref 8.9–10.3)
Creatinine, Ser: 2.04 mg/dL — ABNORMAL HIGH (ref 0.61–1.24)
GFR, EST AFRICAN AMERICAN: 34 mL/min — AB (ref >60)
GFR, EST NON AFRICAN AMERICAN: 30 mL/min — AB (ref >60)
Glucose, Bld: 169 mg/dL — ABNORMAL HIGH (ref 65–99)
POTASSIUM: 4.9 mmol/L (ref 3.5–5.1)
Sodium: 139 mmol/L (ref 135–145)
Total Bilirubin: 0.5 mg/dL (ref 0.3–1.2)
Total Protein: 7.4 g/dL (ref 6.5–8.1)

## 2015-08-27 LAB — TYPE AND SCREEN
ABO/RH(D): O NEG
ANTIBODY SCREEN: NEGATIVE

## 2015-08-27 LAB — SURGICAL PCR SCREEN
MRSA, PCR: NEGATIVE
Staphylococcus aureus: NEGATIVE

## 2015-08-27 LAB — ABO/RH: ABO/RH(D): O NEG

## 2015-08-27 LAB — PROTIME-INR
INR: 1.01 (ref 0.00–1.49)
PROTHROMBIN TIME: 13.5 s (ref 11.6–15.2)

## 2015-08-27 LAB — APTT: APTT: 28 s (ref 24–37)

## 2015-08-27 NOTE — Progress Notes (Signed)
   08/27/15 0944  OBSTRUCTIVE SLEEP APNEA  Have you ever been diagnosed with sleep apnea through a sleep study? No  Do you snore loudly (loud enough to be heard through closed doors)?  0  Do you often feel tired, fatigued, or sleepy during the daytime (such as falling asleep during driving or talking to someone)? 0  Has anyone observed you stop breathing during your sleep? 0  Do you have, or are you being treated for high blood pressure? 1  BMI more than 35 kg/m2? 0  Age > 50 (1-yes) 1  Neck circumference greater than:Male 16 inches or larger, Male 17inches or larger? 1  Male Gender (Yes=1) 1  Obstructive Sleep Apnea Score 4  Score 5 or greater  No PCP

## 2015-08-27 NOTE — Progress Notes (Signed)
PCP- Dr. Jenny Reichmann, pt. Denies any advanced cardiac testing, reports that Dr. Jenny Reichmann manages diabetes & recently d/c'd Metformin.

## 2015-08-27 NOTE — Progress Notes (Signed)
Pt. Reports CBG this a.m. Was 37 at home.

## 2015-08-27 NOTE — Pre-Procedure Instructions (Signed)
Blain Hunsucker Saltzman  08/27/2015      [id number] [place], [place] [phone] [facility] [address] [address] Phone: [phone] Fax: [phone] [address] [place], [place] - [address] [place] [address] Phone: [phone] Fax: [phone]    Your procedure is scheduled on 09/08/2015  Report to [facility] Admitting at 8:00 A.M.  Call this number if you have problems the morning of surgery:  [phone]   Remember:  Do not eat food or drink liquids after midnight.  On Monday   Take these medicines the morning of surgery with A SIP OF WATER : AMLODIPINE   Do not wear jewelry   Do not wear lotions, powders, or perfumes.  You may wear deodorant.              Men may shave face and neck.   Do not bring valuables to the hospital.   [facility] is not responsible for any belongings or valuables.  Contacts, dentures or bridgework may not be worn into surgery.  Leave your suitcase in the car.  After surgery it may be brought to your room.  For patients admitted to the hospital, discharge time will be determined by your treatment team.  Patients discharged the day of surgery will not be allowed to drive home.   Name and phone number of your driver:   With family  Special instructions:  Special Instructions: How to Manage Your Diabetes Before Surgery   Why is it important to control my blood sugar before and after surgery?   Improving blood sugar levels before and after surgery helps healing and can limit problems.  A way of improving blood sugar control is eating a healthy diet by:  - Eating less sugar and carbohydrates  - Increasing activity/exercise  - Talk with your doctor about reaching your blood sugar goals  High blood sugars (greater than 180 mg/dL) can raise your risk of infections and slow down your recovery so you will need to focus on  controlling your diabetes during the weeks before surgery.  Make sure that the doctor who takes care of your diabetes knows about your planned surgery including the date and location.  How do I manage my blood sugars before surgery?   Check your blood sugar at least 4 times a day, 2 days before surgery to make sure that they are not too high or low.   Check your blood sugar the morning of your surgery when you wake up and every 2               hours until you get to the Short-Stay unit.  If your blood sugar is less than 70 mg/dL, you will need to treat for low blood sugar by:  Treat a low blood sugar (less than 70 mg/dL) with 1/2 cup of clear juice (cranberry or apple), 4 glucose tablets, OR glucose gel.  Recheck blood sugar in 15 minutes after treatment (to make sure it is greater than 70 mg/dL).  If blood sugar is not greater than 70 mg/dL on re-check, call [phone] for further instructions.   Report your blood sugar to the Short-Stay nurse when you get to Short-Stay.  References:  [facility], 2007 "How to Manage your Diabetes Before and After Surgery".  What do I do about my diabetes medications?   Do not take  oral diabetes medicines (pills) the morning of surgery.                [facility] - Preparing for Surgery  Before surgery, you can play an important role.  Because skin is not sterile, your skin needs to be as free of germs as possible.  You can reduce the number of germs on you skin by washing with CHG (chlorahexidine gluconate) soap before surgery.  CHG is an antiseptic cleaner which kills germs and bonds with the skin to continue killing germs even after washing.  Please DO NOT use if you have an allergy to CHG or antibacterial soaps.  If your skin becomes reddened/irritated stop using the CHG and inform your nurse when you arrive at Short Stay.  Do not shave (including legs and underarms) for at least 48 hours prior to the  first CHG shower.  You may shave your face.  Please follow these instructions carefully:   1.  Shower with CHG Soap the night before surgery and the  morning of Surgery.  2.  If you choose to wash your hair, wash your hair first as usual with your  normal shampoo.  3.  After you shampoo, rinse your hair and body thoroughly to remove the  Shampoo.  4.  Use CHG as you would any other liquid soap.  You can apply chg directly to the skin and wash gently with scrungie or a clean washcloth.  5.  Apply the CHG Soap to your body ONLY FROM THE NECK DOWN.    Do not use on open wounds or open sores.  Avoid contact with your eyes, ears, mouth and genitals (private parts).  Wash genitals (private parts)   with your normal soap.  6.  Wash thoroughly, paying special attention to the area where your surgery will be performed.  7.  Thoroughly rinse your body with warm water from the neck down.  8.  DO NOT shower/wash with your normal soap after using and rinsing off   the CHG Soap.  9.  Pat yourself dry with a clean towel.            10.  Wear clean pajamas.            11.  Place clean sheets on your bed the night of your first shower and do not sleep with pets.  Day of Surgery  Do not apply any lotions/deodorants the morning of surgery.  Please wear clean clothes to the hospital/surgery center.  Please read over the following fact sheets that you were given. Pain Booklet, Coughing and Deep Breathing, Blood Transfusion Information, Total Joint Packet, MRSA Information and Surgical Site Infection Prevention

## 2015-08-28 LAB — HEMOGLOBIN A1C
HEMOGLOBIN A1C: 7.6 % — AB (ref 4.8–5.6)
MEAN PLASMA GLUCOSE: 171 mg/dL

## 2015-08-28 LAB — URINE CULTURE: Culture: NO GROWTH

## 2015-08-28 NOTE — Progress Notes (Addendum)
Anesthesia Chart Review:  Pt is 79 year old male scheduled for R total knee arthroplasty on 09/08/15 with Dr. Durward Fortes.   PCP is Dr. Cathlean Cower.   PMH includes: HTN, DM, hyperlipidemia, claudication, facial/nose/lip cancer. Former smoker. BMI 27.   Medications include: amlodipine, lipitor, lasix, glipizide, lisinopril, zantac, saxagliptin, viagra.   Preoperative labs reviewed.  HgbA1c 7.6, glucose 169. Cr 2.04, BUN 42.   Chest x-ray 08/27/2015 reviewed. No acute cardiopulmonary disease.   EKG 12/09/2014: sinus rhythm with rate variation. LAFB.  Pt has f/u appointment with Dr. Jenny Reichmann 09/02/15 to review labs. Will revisit chart after that appointment.   Willeen Cass, FNP-BC [facility] Short Stay Surgical Center/Anesthesiology Phone: [phone] 08/28/2015 3:31 PM  Addendum: Pt saw Dr. Jenny Reichmann on 09/02/15 for f/u prior to TKA 09/08/15. Dr. Gwynn Burly note indicates he reviewed pt's pre-admit labs. Dr. Jenny Reichmann does not comment on the pt's renal function. Will repeat BMET DOS. If cr in range of previous results (1.4-1.7 since 11/2013), I anticipate pt can proceed as scheduled.   Willeen Cass, FNP-BC [facility] Short Stay Surgical Center/Anesthesiology Phone: [phone] 09/07/2015 2:27 PM
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
## 2015-09-01 ENCOUNTER — Other Ambulatory Visit: Payer: Self-pay

## 2015-09-01 MED ORDER — METFORMIN HCL 1000 MG PO TABS: 1000.0000 mg | ORAL_TABLET | Freq: Two times a day (BID) | ORAL | Status: DC

## 2015-09-02 ENCOUNTER — Ambulatory Visit (INDEPENDENT_AMBULATORY_CARE_PROVIDER_SITE_OTHER): Payer: Medicare Other | Admitting: Internal Medicine

## 2015-09-02 ENCOUNTER — Encounter: Payer: Self-pay | Admitting: Internal Medicine

## 2015-09-02 VITALS — BP 134/82 | HR 90 | Temp 98.1°F | Ht 78.0 in | Wt 229.0 lb

## 2015-09-02 DIAGNOSIS — E119 Type 2 diabetes mellitus without complications: Secondary | ICD-10-CM | POA: Diagnosis not present

## 2015-09-02 DIAGNOSIS — E785 Hyperlipidemia, unspecified: Secondary | ICD-10-CM

## 2015-09-02 DIAGNOSIS — I10 Essential (primary) hypertension: Secondary | ICD-10-CM | POA: Diagnosis not present

## 2015-09-02 MED ORDER — SAXAGLIPTIN HCL 5 MG PO TABS: 5.0000 mg | ORAL_TABLET | Freq: Every day | ORAL | Status: DC

## 2015-09-02 NOTE — Assessment & Plan Note (Signed)
stable overall by history and exam, recent data reviewed with pt, and pt to continue medical treatment as before,  to f/u any worsening symptoms or concerns Lab Results  Component Value Date   LDLCALC 63 06/10/2015
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
## 2015-09-02 NOTE — Progress Notes (Signed)
Pre visit review using our clinic review tool, if applicable. No additional management support is needed unless otherwise documented below in the visit note. 

## 2015-09-02 NOTE — Patient Instructions (Addendum)
OK to stop the metformin as you have  Please take all new medication as prescribed - the whole pill of the januvia  Please continue all other medications as before, and refills have been done if requested.  Please have the pharmacy call with any other refills you may need.  Please continue your efforts at being more active, low cholesterol diabetic diet, and weight control.  Please keep your appointments with your specialists as you may have planned  Good Luck with your Surgury.  Please return in 6 months, or sooner if needed

## 2015-09-02 NOTE — Assessment & Plan Note (Signed)
stable overall by history and exam, recent data reviewed with pt, and pt to continue medical treatment as before,  to f/u any worsening symptoms or concerns BP Readings from Last 3 Encounters:  09/02/15 134/82  08/27/15 151/77  06/10/15 166/94

## 2015-09-02 NOTE — Assessment & Plan Note (Signed)
Recent well controlled, no mild uncontrolled after pt cut back on meds; ok at this time to stay off the metformin, increase the onglyza to 5 mg per day (no renal dose needed), cont all other meds, cont check cbg's, cont diet, exercise, wt loss efforts

## 2015-09-07 MED ORDER — CHLORHEXIDINE GLUCONATE 4 % EX LIQD: 60.0000 mL | Freq: Once | CUTANEOUS | Status: DC

## 2015-09-07 MED ORDER — CEFAZOLIN SODIUM-DEXTROSE 2-3 GM-% IV SOLR
2.0000 g | INTRAVENOUS | Status: AC
  Administered 2015-09-08: 2 g via INTRAVENOUS
  Filled 2015-09-07: qty 50

## 2015-09-07 MED ORDER — SODIUM CHLORIDE 0.9 % IV SOLN: INTRAVENOUS | Status: DC

## 2015-09-07 MED ORDER — ACETAMINOPHEN 10 MG/ML IV SOLN
1000.0000 mg | INTRAVENOUS | Status: AC
  Administered 2015-09-08: 1000 mg via INTRAVENOUS

## 2015-09-07 NOTE — Progress Notes (Signed)
Patient called to confirm that they will arrive at 7:30 AM day of surgery.

## 2015-09-08 ENCOUNTER — Inpatient Hospital Stay (HOSPITAL_COMMUNITY)
Admission: RE | Admit: 2015-09-08 | Discharge: 2015-09-10 | DRG: 470 | Disposition: A | Discharge status: Home Health | Payer: Medicare Other | Source: Ambulatory Visit | Attending: Orthopaedic Surgery | Admitting: Orthopaedic Surgery

## 2015-09-08 ENCOUNTER — Inpatient Hospital Stay (HOSPITAL_COMMUNITY): Payer: Medicare Other | Admitting: Anesthesiology

## 2015-09-08 ENCOUNTER — Encounter (HOSPITAL_COMMUNITY)
Admission: RE | Disposition: A | Discharge status: Home Health | Payer: Self-pay | Source: Ambulatory Visit | Attending: Orthopaedic Surgery

## 2015-09-08 ENCOUNTER — Inpatient Hospital Stay (HOSPITAL_COMMUNITY): Payer: Medicare Other | Admitting: Emergency Medicine

## 2015-09-08 ENCOUNTER — Encounter (HOSPITAL_COMMUNITY): Payer: Self-pay | Admitting: Certified Registered"

## 2015-09-08 DIAGNOSIS — Z87891 Personal history of nicotine dependence: Secondary | ICD-10-CM | POA: Diagnosis not present

## 2015-09-08 DIAGNOSIS — M1710 Unilateral primary osteoarthritis, unspecified knee: Secondary | ICD-10-CM | POA: Diagnosis present

## 2015-09-08 DIAGNOSIS — I10 Essential (primary) hypertension: Secondary | ICD-10-CM | POA: Diagnosis present

## 2015-09-08 DIAGNOSIS — M25461 Effusion, right knee: Secondary | ICD-10-CM | POA: Diagnosis present

## 2015-09-08 DIAGNOSIS — Z96652 Presence of left artificial knee joint: Secondary | ICD-10-CM | POA: Diagnosis present

## 2015-09-08 DIAGNOSIS — Z23 Encounter for immunization: Secondary | ICD-10-CM

## 2015-09-08 DIAGNOSIS — Z794 Long term (current) use of insulin: Secondary | ICD-10-CM

## 2015-09-08 DIAGNOSIS — M65861 Other synovitis and tenosynovitis, right lower leg: Secondary | ICD-10-CM | POA: Diagnosis present

## 2015-09-08 DIAGNOSIS — M179 Osteoarthritis of knee, unspecified: Secondary | ICD-10-CM | POA: Diagnosis not present

## 2015-09-08 DIAGNOSIS — G8918 Other acute postprocedural pain: Secondary | ICD-10-CM | POA: Diagnosis not present

## 2015-09-08 DIAGNOSIS — E1151 Type 2 diabetes mellitus with diabetic peripheral angiopathy without gangrene: Secondary | ICD-10-CM | POA: Diagnosis present

## 2015-09-08 DIAGNOSIS — M25561 Pain in right knee: Secondary | ICD-10-CM | POA: Diagnosis not present

## 2015-09-08 DIAGNOSIS — M1711 Unilateral primary osteoarthritis, right knee: Secondary | ICD-10-CM | POA: Diagnosis present

## 2015-09-08 DIAGNOSIS — E785 Hyperlipidemia, unspecified: Secondary | ICD-10-CM | POA: Diagnosis present

## 2015-09-08 HISTORY — PX: TOTAL KNEE ARTHROPLASTY: SHX125

## 2015-09-08 LAB — GLUCOSE, CAPILLARY
GLUCOSE-CAPILLARY: 94 mg/dL (ref 65–99)
GLUCOSE-CAPILLARY: 126 mg/dL — AB (ref 65–99)
Glucose-Capillary: 96 mg/dL (ref 65–99)
Glucose-Capillary: 163 mg/dL — ABNORMAL HIGH (ref 65–99)

## 2015-09-08 LAB — BASIC METABOLIC PANEL
ANION GAP: 10 (ref 5–15)
BUN: 38 mg/dL — ABNORMAL HIGH (ref 6–20)
CHLORIDE: 101 mmol/L (ref 101–111)
CO2: 25 mmol/L (ref 22–32)
CREATININE: 1.65 mg/dL — AB (ref 0.61–1.24)
Calcium: 9.5 mg/dL (ref 8.9–10.3)
GFR calc non Af Amer: 38 mL/min — ABNORMAL LOW (ref >60)
GFR, EST AFRICAN AMERICAN: 45 mL/min — AB (ref >60)
GLUCOSE: 100 mg/dL — AB (ref 65–99)
Potassium: 4.2 mmol/L (ref 3.5–5.1)
Sodium: 136 mmol/L (ref 135–145)

## 2015-09-08 SURGERY — ARTHROPLASTY, KNEE, TOTAL
Anesthesia: Regional | Site: Knee | Laterality: Right

## 2015-09-08 MED ORDER — ACETAMINOPHEN 10 MG/ML IV SOLN
1000.0000 mg | Freq: Four times a day (QID) | INTRAVENOUS | Status: AC
  Administered 2015-09-08 – 2015-09-09 (×4): 1000 mg via INTRAVENOUS
  Filled 2015-09-08 (×4): qty 100

## 2015-09-08 MED ORDER — 0.9 % SODIUM CHLORIDE (POUR BTL) OPTIME
TOPICAL | Status: DC | PRN
  Administered 2015-09-08: 1000 mL

## 2015-09-08 MED ORDER — DIPHENHYDRAMINE HCL 12.5 MG/5ML PO ELIX: 12.5000 mg | ORAL_SOLUTION | ORAL | Status: DC | PRN

## 2015-09-08 MED ORDER — FLUTICASONE PROPIONATE 50 MCG/ACT NA SUSP
2.0000 | Freq: Every day | NASAL | Status: DC | PRN
  Filled 2015-09-08: qty 16

## 2015-09-08 MED ORDER — HYDROMORPHONE HCL 1 MG/ML IJ SOLN
0.2500 mg | INTRAMUSCULAR | Status: DC | PRN
  Administered 2015-09-08: 1 mg via INTRAVENOUS

## 2015-09-08 MED ORDER — RIVAROXABAN 10 MG PO TABS
10.0000 mg | ORAL_TABLET | Freq: Every day | ORAL | Status: DC
  Administered 2015-09-09 – 2015-09-10 (×2): 10 mg via ORAL
  Filled 2015-09-08 (×2): qty 1

## 2015-09-08 MED ORDER — PROPOFOL 10 MG/ML IV BOLUS
INTRAVENOUS | Status: AC
  Filled 2015-09-08: qty 20

## 2015-09-08 MED ORDER — HYDROMORPHONE HCL 1 MG/ML IJ SOLN
INTRAMUSCULAR | Status: AC
  Filled 2015-09-08: qty 1

## 2015-09-08 MED ORDER — METOCLOPRAMIDE HCL 5 MG/ML IJ SOLN: 5.0000 mg | Freq: Three times a day (TID) | INTRAMUSCULAR | Status: DC | PRN

## 2015-09-08 MED ORDER — FENTANYL CITRATE (PF) 100 MCG/2ML IJ SOLN
INTRAMUSCULAR | Status: AC
  Administered 2015-09-08: 50 ug
  Filled 2015-09-08: qty 2

## 2015-09-08 MED ORDER — INSULIN ASPART 100 UNIT/ML ~~LOC~~ SOLN
0.0000 [IU] | Freq: Three times a day (TID) | SUBCUTANEOUS | Status: DC
  Administered 2015-09-09 (×2): 2 [IU] via SUBCUTANEOUS

## 2015-09-08 MED ORDER — ONDANSETRON HCL 4 MG/2ML IJ SOLN: 4.0000 mg | Freq: Four times a day (QID) | INTRAMUSCULAR | Status: DC | PRN

## 2015-09-08 MED ORDER — LISINOPRIL 10 MG PO TABS
10.0000 mg | ORAL_TABLET | Freq: Every day | ORAL | Status: DC
  Administered 2015-09-09: 10 mg via ORAL
  Filled 2015-09-08 (×2): qty 1

## 2015-09-08 MED ORDER — ONDANSETRON HCL 4 MG PO TABS: 4.0000 mg | ORAL_TABLET | Freq: Four times a day (QID) | ORAL | Status: DC | PRN

## 2015-09-08 MED ORDER — BUPIVACAINE-EPINEPHRINE 0.25% -1:200000 IJ SOLN
INTRAMUSCULAR | Status: DC | PRN
  Administered 2015-09-08: 30 mL

## 2015-09-08 MED ORDER — SODIUM CHLORIDE 0.9 % IV SOLN
10.0000 mg | INTRAVENOUS | Status: DC | PRN
  Administered 2015-09-08: 20 ug/min via INTRAVENOUS

## 2015-09-08 MED ORDER — FUROSEMIDE 20 MG PO TABS
20.0000 mg | ORAL_TABLET | Freq: Every day | ORAL | Status: DC
  Administered 2015-09-09 – 2015-09-10 (×2): 20 mg via ORAL
  Filled 2015-09-08 (×2): qty 1

## 2015-09-08 MED ORDER — SODIUM CHLORIDE 0.9 % IV SOLN
INTRAVENOUS | Status: DC
  Administered 2015-09-08: 75 mL/h via INTRAVENOUS

## 2015-09-08 MED ORDER — MIDAZOLAM HCL 2 MG/2ML IJ SOLN
INTRAMUSCULAR | Status: AC
  Filled 2015-09-08: qty 2

## 2015-09-08 MED ORDER — METFORMIN HCL 500 MG PO TABS: 1000.0000 mg | ORAL_TABLET | Freq: Two times a day (BID) | ORAL | Status: DC

## 2015-09-08 MED ORDER — GLIPIZIDE ER 10 MG PO TB24
10.0000 mg | ORAL_TABLET | Freq: Every day | ORAL | Status: DC
  Administered 2015-09-09: 10 mg via ORAL
  Filled 2015-09-08 (×3): qty 1

## 2015-09-08 MED ORDER — ROCURONIUM BROMIDE 50 MG/5ML IV SOLN
INTRAVENOUS | Status: AC
  Filled 2015-09-08: qty 1

## 2015-09-08 MED ORDER — METHOCARBAMOL 1000 MG/10ML IJ SOLN
500.0000 mg | Freq: Four times a day (QID) | INTRAVENOUS | Status: DC | PRN
  Filled 2015-09-08: qty 5

## 2015-09-08 MED ORDER — OXYCODONE HCL 5 MG PO TABS
5.0000 mg | ORAL_TABLET | ORAL | Status: DC | PRN
  Administered 2015-09-09 (×3): 5 mg via ORAL
  Administered 2015-09-10 (×2): 10 mg via ORAL
  Filled 2015-09-08 (×3): qty 1
  Filled 2015-09-08 (×2): qty 2

## 2015-09-08 MED ORDER — METHOCARBAMOL 500 MG PO TABS
500.0000 mg | ORAL_TABLET | Freq: Four times a day (QID) | ORAL | Status: DC | PRN
  Administered 2015-09-09 (×2): 500 mg via ORAL
  Filled 2015-09-08 (×2): qty 1

## 2015-09-08 MED ORDER — PHENOL 1.4 % MT LIQD: 1.0000 | OROMUCOSAL | Status: DC | PRN

## 2015-09-08 MED ORDER — DOCUSATE SODIUM 100 MG PO CAPS
100.0000 mg | ORAL_CAPSULE | Freq: Two times a day (BID) | ORAL | Status: DC
  Administered 2015-09-08 – 2015-09-10 (×4): 100 mg via ORAL
  Filled 2015-09-08 (×4): qty 1

## 2015-09-08 MED ORDER — LINAGLIPTIN 5 MG PO TABS
5.0000 mg | ORAL_TABLET | Freq: Every day | ORAL | Status: DC
  Administered 2015-09-09: 5 mg via ORAL
  Filled 2015-09-08 (×2): qty 1

## 2015-09-08 MED ORDER — SODIUM CHLORIDE 0.9 % IV SOLN
2000.0000 mg | INTRAVENOUS | Status: AC
  Administered 2015-09-08: 2000 mg via TOPICAL
  Filled 2015-09-08: qty 20

## 2015-09-08 MED ORDER — MENTHOL 3 MG MT LOZG: 1.0000 | LOZENGE | OROMUCOSAL | Status: DC | PRN

## 2015-09-08 MED ORDER — SODIUM CHLORIDE 0.9 % IR SOLN
Status: DC | PRN
  Administered 2015-09-08: 1000 mL

## 2015-09-08 MED ORDER — POLYETHYLENE GLYCOL 3350 17 G PO PACK: 17.0000 g | PACK | Freq: Every day | ORAL | Status: DC | PRN

## 2015-09-08 MED ORDER — PHENYLEPHRINE 40 MCG/ML (10ML) SYRINGE FOR IV PUSH (FOR BLOOD PRESSURE SUPPORT)
PREFILLED_SYRINGE | INTRAVENOUS | Status: AC
  Filled 2015-09-08: qty 10

## 2015-09-08 MED ORDER — PROMETHAZINE HCL 25 MG/ML IJ SOLN: 6.2500 mg | INTRAMUSCULAR | Status: DC | PRN

## 2015-09-08 MED ORDER — AMLODIPINE BESYLATE 5 MG PO TABS
5.0000 mg | ORAL_TABLET | Freq: Every day | ORAL | Status: DC
  Administered 2015-09-09: 5 mg via ORAL
  Filled 2015-09-08: qty 1

## 2015-09-08 MED ORDER — LORATADINE 10 MG PO TABS: 10.0000 mg | ORAL_TABLET | Freq: Every day | ORAL | Status: DC | PRN

## 2015-09-08 MED ORDER — EPHEDRINE SULFATE 50 MG/ML IJ SOLN
INTRAMUSCULAR | Status: AC
  Filled 2015-09-08: qty 1

## 2015-09-08 MED ORDER — PHENYLEPHRINE HCL 10 MG/ML IJ SOLN
INTRAMUSCULAR | Status: AC
  Filled 2015-09-08: qty 1

## 2015-09-08 MED ORDER — SODIUM CHLORIDE 0.9 % IJ SOLN
INTRAMUSCULAR | Status: AC
  Filled 2015-09-08: qty 10

## 2015-09-08 MED ORDER — HYDROMORPHONE HCL 1 MG/ML IJ SOLN: 0.5000 mg | INTRAMUSCULAR | Status: DC | PRN

## 2015-09-08 MED ORDER — BUPIVACAINE-EPINEPHRINE (PF) 0.25% -1:200000 IJ SOLN
INTRAMUSCULAR | Status: AC
  Filled 2015-09-08: qty 30

## 2015-09-08 MED ORDER — BISACODYL 10 MG RE SUPP: 10.0000 mg | Freq: Every day | RECTAL | Status: DC | PRN

## 2015-09-08 MED ORDER — ACETAMINOPHEN 10 MG/ML IV SOLN
INTRAVENOUS | Status: AC
  Filled 2015-09-08: qty 100

## 2015-09-08 MED ORDER — FENTANYL CITRATE (PF) 250 MCG/5ML IJ SOLN
INTRAMUSCULAR | Status: AC
  Filled 2015-09-08: qty 5

## 2015-09-08 MED ORDER — FAMOTIDINE 20 MG PO TABS
20.0000 mg | ORAL_TABLET | Freq: Every day | ORAL | Status: DC
  Administered 2015-09-08 – 2015-09-09 (×2): 20 mg via ORAL
  Filled 2015-09-08 (×2): qty 1

## 2015-09-08 MED ORDER — PROPOFOL 500 MG/50ML IV EMUL
INTRAVENOUS | Status: DC | PRN
  Administered 2015-09-08: 50 ug/kg/min via INTRAVENOUS

## 2015-09-08 MED ORDER — KETOROLAC TROMETHAMINE 15 MG/ML IJ SOLN
7.5000 mg | Freq: Four times a day (QID) | INTRAMUSCULAR | Status: AC
  Administered 2015-09-08 – 2015-09-09 (×3): 7.5 mg via INTRAVENOUS
  Filled 2015-09-08 (×4): qty 1

## 2015-09-08 MED ORDER — INSULIN ASPART 100 UNIT/ML ~~LOC~~ SOLN: 0.0000 [IU] | Freq: Every day | SUBCUTANEOUS | Status: DC

## 2015-09-08 MED ORDER — ALUM & MAG HYDROXIDE-SIMETH 200-200-20 MG/5ML PO SUSP: 30.0000 mL | ORAL | Status: DC | PRN

## 2015-09-08 MED ORDER — METOCLOPRAMIDE HCL 5 MG PO TABS: 5.0000 mg | ORAL_TABLET | Freq: Three times a day (TID) | ORAL | Status: DC | PRN

## 2015-09-08 MED ORDER — CEFAZOLIN SODIUM-DEXTROSE 2-3 GM-% IV SOLR
2.0000 g | Freq: Four times a day (QID) | INTRAVENOUS | Status: AC
  Administered 2015-09-08 (×2): 2 g via INTRAVENOUS
  Filled 2015-09-08 (×2): qty 50

## 2015-09-08 MED ORDER — INFLUENZA VAC SPLIT QUAD 0.5 ML IM SUSY
0.5000 mL | PREFILLED_SYRINGE | INTRAMUSCULAR | Status: AC
  Administered 2015-09-09: 0.5 mL via INTRAMUSCULAR
  Filled 2015-09-08: qty 0.5

## 2015-09-08 MED ORDER — MAGNESIUM CITRATE PO SOLN: 1.0000 | Freq: Once | ORAL | Status: DC | PRN

## 2015-09-08 MED ORDER — PHENYLEPHRINE HCL 10 MG/ML IJ SOLN
INTRAMUSCULAR | Status: DC | PRN
  Administered 2015-09-08: 160 ug via INTRAVENOUS
  Administered 2015-09-08 (×3): 80 ug via INTRAVENOUS

## 2015-09-08 MED ORDER — ATORVASTATIN CALCIUM 40 MG PO TABS
40.0000 mg | ORAL_TABLET | Freq: Every day | ORAL | Status: DC
  Administered 2015-09-09: 40 mg via ORAL
  Filled 2015-09-08: qty 1

## 2015-09-08 MED ORDER — MEPERIDINE HCL 25 MG/ML IJ SOLN: 6.2500 mg | INTRAMUSCULAR | Status: DC | PRN

## 2015-09-08 MED ORDER — SODIUM CHLORIDE 0.9 % IV SOLN
INTRAVENOUS | Status: DC
  Administered 2015-09-08: 11:00:00 via INTRAVENOUS
  Administered 2015-09-08: 10 mL/h via INTRAVENOUS

## 2015-09-08 MED ORDER — COLCHICINE 0.6 MG PO TABS: 0.6000 mg | ORAL_TABLET | Freq: Every day | ORAL | Status: DC

## 2015-09-08 SURGICAL SUPPLY — 64 items
ANCH SUT 2 CP-2 ROTR CUF (Anchor) ×1 IMPLANT
ANCHOR ROTATOR CUFF #2 (Anchor) ×2 IMPLANT
BANDAGE ESMARK 6X9 LF (GAUZE/BANDAGES/DRESSINGS) ×1 IMPLANT
BLADE SAGITTAL 25.0X1.19X90 (BLADE) ×2 IMPLANT
BLADE SAGITTAL 25.0X1.19X90MM (BLADE) ×1
BNDG CMPR 9X6 STRL LF SNTH (GAUZE/BANDAGES/DRESSINGS) ×1
BNDG ESMARK 6X9 LF (GAUZE/BANDAGES/DRESSINGS) ×3
BOWL SMART MIX CTS (DISPOSABLE) ×3 IMPLANT
CAP KNEE TOTAL 3 SIGMA ×2 IMPLANT
CEMENT HV SMART SET (Cement) ×6 IMPLANT
COVER SURGICAL LIGHT HANDLE (MISCELLANEOUS) ×3 IMPLANT
CUFF TOURNIQUET SINGLE 34IN LL (TOURNIQUET CUFF) ×2 IMPLANT
CUFF TOURNIQUET SINGLE 44IN (TOURNIQUET CUFF) IMPLANT
DRAPE EXTREMITY T 121X128X90 (DRAPE) ×3 IMPLANT
DRAPE IMP U-DRAPE 54X76 (DRAPES) ×3 IMPLANT
DRAPE PROXIMA HALF (DRAPES) ×3 IMPLANT
DRSG ADAPTIC 3X8 NADH LF (GAUZE/BANDAGES/DRESSINGS) ×3 IMPLANT
DRSG PAD ABDOMINAL 8X10 ST (GAUZE/BANDAGES/DRESSINGS) ×6 IMPLANT
DURAPREP 26ML APPLICATOR (WOUND CARE) ×6 IMPLANT
ELECT CAUTERY BLADE 6.4 (BLADE) ×3 IMPLANT
ELECT REM PT RETURN 9FT ADLT (ELECTROSURGICAL) ×3
ELECTRODE REM PT RTRN 9FT ADLT (ELECTROSURGICAL) ×1 IMPLANT
EVACUATOR 1/8 PVC DRAIN (DRAIN) ×2 IMPLANT
FACESHIELD WRAPAROUND (MASK) ×6 IMPLANT
FACESHIELD WRAPAROUND OR TEAM (MASK) ×2 IMPLANT
GAUZE SPONGE 4X4 12PLY STRL (GAUZE/BANDAGES/DRESSINGS) ×3 IMPLANT
GLOVE BIOGEL PI IND STRL 8 (GLOVE) ×1 IMPLANT
GLOVE BIOGEL PI IND STRL 8.5 (GLOVE) ×1 IMPLANT
GLOVE BIOGEL PI INDICATOR 8 (GLOVE) ×2
GLOVE BIOGEL PI INDICATOR 8.5 (GLOVE) ×2
GLOVE ECLIPSE 8.0 STRL XLNG CF (GLOVE) ×6 IMPLANT
GLOVE SURG ORTHO 8.5 STRL (GLOVE) ×6 IMPLANT
GOWN STRL REUS W/ TWL LRG LVL3 (GOWN DISPOSABLE) ×2 IMPLANT
GOWN STRL REUS W/TWL 2XL LVL3 (GOWN DISPOSABLE) ×3 IMPLANT
GOWN STRL REUS W/TWL LRG LVL3 (GOWN DISPOSABLE) ×6
HANDPIECE INTERPULSE COAX TIP (DISPOSABLE) ×3
KIT BASIN OR (CUSTOM PROCEDURE TRAY) ×3 IMPLANT
KIT ROOM TURNOVER OR (KITS) ×3 IMPLANT
MANIFOLD NEPTUNE II (INSTRUMENTS) ×3 IMPLANT
NEEDLE 22X1 1/2 (OR ONLY) (NEEDLE) IMPLANT
NS IRRIG 1000ML POUR BTL (IV SOLUTION) ×3 IMPLANT
PACK TOTAL JOINT (CUSTOM PROCEDURE TRAY) ×3 IMPLANT
PACK UNIVERSAL I (CUSTOM PROCEDURE TRAY) ×3 IMPLANT
PAD ARMBOARD 7.5X6 YLW CONV (MISCELLANEOUS) ×6 IMPLANT
PAD CAST 4YDX4 CTTN HI CHSV (CAST SUPPLIES) ×1 IMPLANT
PADDING CAST COTTON 4X4 STRL (CAST SUPPLIES) ×3
PADDING CAST COTTON 6X4 STRL (CAST SUPPLIES) ×3 IMPLANT
SET HNDPC FAN SPRY TIP SCT (DISPOSABLE) ×1 IMPLANT
SPONGE GAUZE 4X4 12PLY STER LF (GAUZE/BANDAGES/DRESSINGS) ×2 IMPLANT
SPONGE LAP 18X18 X RAY DECT (DISPOSABLE) ×2 IMPLANT
STAPLER VISISTAT 35W (STAPLE) ×3 IMPLANT
SUCTION FRAZIER TIP 10 FR DISP (SUCTIONS) ×3 IMPLANT
SURGIFLO W/THROMBIN 8M KIT (HEMOSTASIS) IMPLANT
SUT BONE WAX W31G (SUTURE) ×3 IMPLANT
SUT ETHIBOND NAB CT1 #1 30IN (SUTURE) ×8 IMPLANT
SUT MNCRL AB 3-0 PS2 18 (SUTURE) ×3 IMPLANT
SUT VIC AB 0 CT1 27 (SUTURE) ×6
SUT VIC AB 0 CT1 27XBRD ANBCTR (SUTURE) ×1 IMPLANT
SYR CONTROL 10ML LL (SYRINGE) IMPLANT
TOWEL OR 17X24 6PK STRL BLUE (TOWEL DISPOSABLE) ×3 IMPLANT
TOWEL OR 17X26 10 PK STRL BLUE (TOWEL DISPOSABLE) ×3 IMPLANT
TRAY FOLEY CATH 16FRSI W/METER (SET/KITS/TRAYS/PACK) IMPLANT
WATER STERILE IRR 1000ML POUR (IV SOLUTION) ×2 IMPLANT
WRAP KNEE MAXI GEL POST OP (GAUZE/BANDAGES/DRESSINGS) ×3 IMPLANT

## 2015-09-08 NOTE — Progress Notes (Signed)
Orthopedic Tech Progress Note Patient Details:  [name] 04/01/1936 [id number] On cpm at 6:40 pm Patient ID: [name], male   DOB: 25-Nov-1936, 79 y.o.   MRN: [id number]   [name] 09/08/2015, 6:40 PM

## 2015-09-08 NOTE — Transfer of Care (Signed)
Immediate Anesthesia Transfer of Care Note  Patient: [name]  Procedure(s) Performed: Procedure(s): TOTAL KNEE ARTHROPLASTY (Right)  Patient Location: PACU  Anesthesia Type:MAC  Level of Consciousness: awake, alert  and oriented  Airway & Oxygen Therapy: Patient Spontanous Breathing  Post-op Assessment: Report given to RN  Post vital signs: Reviewed and stable  Last Vitals:  Filed Vitals:   09/08/15 0915  BP: 137/68  Pulse: 69  Temp:   Resp: 20    Complications: No apparent anesthesia complications

## 2015-09-08 NOTE — Anesthesia Procedure Notes (Addendum)
Anesthesia Regional Block:  Femoral nerve block  Pre-Anesthetic Checklist: ,, timeout performed, Correct Patient, Correct Site, Correct Laterality, Correct Procedure, Correct Position, site marked, Risks and benefits discussed, Surgical consent,  Pre-op evaluation,  Post-op pain management  Laterality: Right  Prep: chloraprep       Needles:  Injection technique: Single-shot  Needle Type: Stimulator Needle - 80     Needle Length: 9cm 9 cm Needle Gauge: 22 and 22 G  Needle insertion depth: 6 cm   Additional Needles:  Procedures: ultrasound guided (picture in chart) and nerve stimulator Femoral nerve block  Nerve Stimulator or Paresthesia:  Response: Patellar snap, 0.5 mA,   Additional Responses:   Narrative:  Injection made incrementally with aspirations every 5 mL.  Performed by: Personally  Anesthesiologist: Nolon Nations  Additional Notes: BP cuff, EKG monitors applied. Sedation begun. Femoral artery palpated for location of nerve. After nerve location verified with U/S, anesthetic injected incrementally, slowly, and after negative aspirations under direct u/s guidance. Good perineural spread. Patient tolerated well.   Procedure Name: MAC Date/Time: 09/08/2015 10:10 AM Performed by: Barrington Ellison Pre-anesthesia Checklist: Patient identified, Emergency Drugs available, Suction available, Patient being monitored and Timeout performed Patient Re-evaluated:Patient Re-evaluated prior to inductionOxygen Delivery Method: Nasal cannula Preoxygenation: Pre-oxygenation with 100% oxygen    Procedure Name: MAC Date/Time: 09/08/2015 10:15 AM Performed by: Sampson Si E Pre-anesthesia Checklist: Patient identified, Emergency Drugs available, Suction available, Patient being monitored and Timeout performed Oxygen Delivery Method: Simple face mask    Spinal Patient location during procedure: OR Staffing Anesthesiologist: Nolon Nations Performed by: anesthesiologist   Preanesthetic Checklist Completed: patient identified, site marked, surgical consent, pre-op evaluation, timeout performed, IV checked, risks and benefits discussed and monitors and equipment checked Spinal Block Patient position: sitting Prep: Betadine Patient monitoring: heart rate, continuous pulse ox and blood pressure Approach: right paramedian Location: L3-4 Injection technique: single-shot Needle Needle type: Sprotte  Needle gauge: 24 G Needle length: 9 cm Additional Notes Expiration date of kit checked and confirmed. Patient tolerated procedure well, without complications.

## 2015-09-08 NOTE — Progress Notes (Signed)
Utilization review completed.  

## 2015-09-08 NOTE — Progress Notes (Signed)
Orthopedic Tech Progress Note Patient Details:  [name] Apr 19, 1936 [id number]  CPM Right Knee CPM Right Knee: On Right Knee Flexion (Degrees): 90 Right Knee Extension (Degrees): 0 Additional Comments: trapeze bar patient helper Viewed order from doctor's order list  Hildred Priest 09/08/2015, 1:06 PM

## 2015-09-08 NOTE — Plan of Care (Signed)
Problem: Consults Goal: Diagnosis- Total Joint Replacement Primary Total Knee     

## 2015-09-08 NOTE — Anesthesia Preprocedure Evaluation (Addendum)
Anesthesia Evaluation  Patient identified by MRN, date of birth, ID band Patient awake    Reviewed: Allergy & Precautions, NPO status , Patient's Chart, lab work & pertinent test results  Airway Mallampati: II  TM Distance: >3 FB Neck ROM: Full    Dental no notable dental hx. (+) Teeth Intact, Dental Advisory Given   Pulmonary neg pulmonary ROS, former smoker,    Pulmonary exam normal breath sounds clear to auscultation       Cardiovascular hypertension, Pt. on medications + Peripheral Vascular Disease  Normal cardiovascular exam Rhythm:Regular Rate:Normal     Neuro/Psych negative neurological ROS  negative psych ROS   GI/Hepatic Neg liver ROS, Medicated,  Endo/Other  diabetes, Type 2, Oral Hypoglycemic Agents  Renal/GU Renal disease     Musculoskeletal  (+) Arthritis ,   Abdominal   Peds  Hematology negative hematology ROS (+)   Anesthesia Other Findings   Reproductive/Obstetrics negative OB ROS                           Anesthesia Physical Anesthesia Plan  ASA: III  Anesthesia Plan: Spinal and Regional   Post-op Pain Management:    Induction: Intravenous  Airway Management Planned: Simple Face Mask  Additional Equipment:   Intra-op Plan:   Post-operative Plan:   Informed Consent: I have reviewed the patients History and Physical, chart, labs and discussed the procedure including the risks, benefits and alternatives for the proposed anesthesia with the patient or authorized representative who has indicated his/her understanding and acceptance.   Dental advisory given  Plan Discussed with: CRNA  Anesthesia Plan Comments:         Anesthesia Quick Evaluation

## 2015-09-08 NOTE — Op Note (Signed)
PATIENT ID:      MILDRED BOLLARD  MRN:     [id number] DOB/AGE:    06-28-1936 / 79 y.o.       OPERATIVE REPORT    DATE OF PROCEDURE:  09/08/2015       PREOPERATIVE DIAGNOSIS:   OSTEOARTHRITIS OF RIGHT KNEE-END STAGE                                                       Estimated body mass index is 26.47 kg/(m^2) as calculated from the following:   Height as of this encounter: 6\' 6"  (1.981 m).   Weight as of this encounter: 103.874 kg (229 lb).     POSTOPERATIVE DIAGNOSIS:   OSTEOARTHRITIS OF RIGHT KNEE -SAME                                                                    Estimated body mass index is 26.47 kg/(m^2) as calculated from the following:   Height as of this encounter: 6\' 6"  (1.981 m).   Weight as of this encounter: 103.874 kg (229 lb).     PROCEDURE:  Procedure(s):RIGHTTOTAL KNEE ARTHROPLASTY     SURGEON:  Joni Fears, MD    ASSISTANT:   Biagio Borg, PA-C   (Present and scrubbed throughout the case, critical for assistance with exposure, retraction, instrumentation, and closure.)          ANESTHESIA: regional and spinal     DRAINS: (RIGHT KNEE) Hemovact drain(s) in the CLAMPED with  Suction Clamped :      TOURNIQUET TIME:  Total Tourniquet Time Documented: Thigh (Right) - 76 minutes Total: Thigh (Right) - 76 minutes     COMPLICATIONS:  None   CONDITION:  stable  PROCEDURE IN DETAIL: [id number]   WHITFIELD, PETER W 09/08/2015, 12:15 PM

## 2015-09-08 NOTE — H&P (Signed)
  The recent History & Physical has been reviewed. I have personally examined the patient today. There is no interval change to the documented History & Physical. The patient would like to proceed with the procedure.  Joni Fears W 09/08/2015,  9:57 AM

## 2015-09-08 NOTE — OR Nursing (Signed)
All OR record documentation from [phone] by [facility]

## 2015-09-08 NOTE — Progress Notes (Signed)
Unremarkable pacu stay/ minimal discomfort and tolerating cpm without difficulties x 1 when ortho tech removed/reapplied d/t malfxn, minimal discomfort whn out of cpm. Wife at bedside for extended period, visiting with pt.

## 2015-09-08 NOTE — Anesthesia Postprocedure Evaluation (Signed)
Anesthesia Post Note  Patient: [name]  Procedure(s) Performed: Procedure(s) (LRB): TOTAL KNEE ARTHROPLASTY (Right)  Anesthesia type: Spinal + FNB  Patient location: PACU  Post pain: Pain level controlled  Post assessment: Post-op Vital signs reviewed  Last Vitals: BP 111/65 mmHg  Pulse 53  Temp(Src) 36.6 C (Oral)  Resp 16  Ht 6\' 6"  (1.981 m)  Wt 229 lb (103.874 kg)  BMI 26.47 kg/m2  SpO2 97%  Post vital signs: Reviewed  Level of consciousness: sedated  Complications: No apparent anesthesia complications

## 2015-09-09 ENCOUNTER — Encounter (HOSPITAL_COMMUNITY): Payer: Self-pay | Admitting: General Practice

## 2015-09-09 DIAGNOSIS — M1711 Unilateral primary osteoarthritis, right knee: Secondary | ICD-10-CM | POA: Diagnosis not present

## 2015-09-09 LAB — CBC
HCT: 39.2 % (ref 39.0–52.0)
Hemoglobin: 12.6 g/dL — ABNORMAL LOW (ref 13.0–17.0)
MCH: 28 pg (ref 26.0–34.0)
MCHC: 32.1 g/dL (ref 30.0–36.0)
MCV: 87.1 fL (ref 78.0–100.0)
PLATELETS: 181 10*3/uL (ref 150–400)
RBC: 4.5 MIL/uL (ref 4.22–5.81)
RDW: 15.1 % (ref 11.5–15.5)
WBC: 10.9 10*3/uL — ABNORMAL HIGH (ref 4.0–10.5)

## 2015-09-09 LAB — BASIC METABOLIC PANEL WITH GFR
Anion gap: 7 (ref 5–15)
BUN: 29 mg/dL — ABNORMAL HIGH (ref 6–20)
CO2: 24 mmol/L (ref 22–32)
Calcium: 9 mg/dL (ref 8.9–10.3)
Chloride: 106 mmol/L (ref 101–111)
Creatinine, Ser: 1.5 mg/dL — ABNORMAL HIGH (ref 0.61–1.24)
GFR calc Af Amer: 50 mL/min — ABNORMAL LOW
GFR calc non Af Amer: 43 mL/min — ABNORMAL LOW
Glucose, Bld: 121 mg/dL — ABNORMAL HIGH (ref 65–99)
Potassium: 4.9 mmol/L (ref 3.5–5.1)
Sodium: 137 mmol/L (ref 135–145)

## 2015-09-09 LAB — HEMOGLOBIN A1C
Hgb A1c MFr Bld: 7.8 % — ABNORMAL HIGH (ref 4.8–5.6)
Mean Plasma Glucose: 177 mg/dL

## 2015-09-09 LAB — GLUCOSE, CAPILLARY
GLUCOSE-CAPILLARY: 147 mg/dL — AB (ref 65–99)
GLUCOSE-CAPILLARY: 90 mg/dL (ref 65–99)
Glucose-Capillary: 115 mg/dL — ABNORMAL HIGH (ref 65–99)
Glucose-Capillary: 137 mg/dL — ABNORMAL HIGH (ref 65–99)

## 2015-09-09 MED ORDER — METFORMIN HCL 500 MG PO TABS
1000.0000 mg | ORAL_TABLET | Freq: Two times a day (BID) | ORAL | Status: DC
  Filled 2015-09-09: qty 2

## 2015-09-09 NOTE — Op Note (Signed)
NAMEMarland Kitchen  [name], [name] NO.:  [id number]  MEDICAL RECORD NO.:  [id number]  LOCATION:  5N07C                        FACILITY:  [facility]  PHYSICIAN:  Vonna Kotyk. Whitfield, M.D.DATE OF BIRTH:  19-Jan-1936  DATE OF PROCEDURE:  09/08/2015 DATE OF DISCHARGE:                              OPERATIVE REPORT   PREOPERATIVE DIAGNOSIS:  Primary end-stage osteoarthritis, right knee.  POSTOPERATIVE DIAGNOSIS:  Primary end-stage osteoarthritis, right knee.  PROCEDURE:  Right total knee replacement.  SURGEON:  Vonna Kotyk. Durward Fortes, M.D.  ASSISTANT:  Aaron Edelman D. Petrarca, PA-C.  He was present throughout the operative procedure to ensure its timely completion.  ANESTHESIA:  Spinal with IV sedation and femoral nerve block.  COMPLICATIONS:  None.  COMPONENTS:  DePuy LCS large plus femoral component, a 12.5 mm polyethylene bridging bearing, a #6 rotating tibial platform, a metal back rotating patella.  Components were secured with polymethyl methacrylate.  DESCRIPTION OF PROCEDURE:  [name] was met in the holding area.  I identified the right knee as the appropriate operative site.  He did receive a preoperative femoral nerve block per Anesthesia.  Any questions were answered.  The patient was then transported to room #7.  Spinal anesthesia was performed per Anesthesia.  The patient was then given IV sedation.  At that point, we placed his right lower extremity in a thigh tourniquet and prepped his right lower extremity with chlorhexidine scrub and DuraPrep x2 from the tourniquet to the tips of the toes.  Sterile draping was performed.  Time-out was called.  He received 2 g of Ancef IV.  The right lower extremity was elevated and Esmarch exsanguinated with a proximal tourniquet at 350 mmHg.  The patient had a fixed varus and flexion contracture, which made the procedure somewhat more technically difficult.  I did perform a midline longitudinal incision, extending from the  superior pouch to tibial tubercle via sharp dissection, carried down to subcutaneous tissue. First layer of capsule was incised in the midline and medial parapatellar incision was made through the deep capsule with the Bovie. The joint was entered, there was a clear yellow joint effusion, approximately 15-20 mL in volume.  Patella was everted to 180 degrees. There was a very minimal avulsion from the tibial tubercle longitudinally, which I repaired at the end of the case with Mitek anchor.  There was abundant somewhat beefy red synovitis.  The patient does have a history of gout.  He did have a fixed varus position, fixed flexion contracture.  There was no cartilage remaining on the medial tibial plateau or femoral condyle.  Osteophytes were removed from around the medial tibia and bilaterally at the femur, and I measured large plus femoral component.  I did perform a medial release subperiosteally at which point, I could then align the leg in neutral.  First bony cut was made transversely on the tibia with a 7-degree angle of declination using the external tibial guide.  With each bony cut on the femur and the tibia, I checked my alignment with the external guide.  Subsequent cuts were then made on the femur using the large plus femoral jig, a 4-degree distal femoral valgus cut was utilized.  Throughout the  procedure, MCL and LCL remained intact.  Laminar spreaders were placed along the medial and lateral compartments. With visualization, I did remove the medial and lateral menisci as well as ACL and PCL.  There were several small loose bodies, which were removed.  Osteophytes were removed from the posterior femoral condyle using a 3/4-inch curved osteotome.  Flexion and extension gaps were symmetrical 12.5 mm.  Final cut was then made on the femur for tapering purposes and to obtain the center holes.  Retractor was then placed about the tibia, it was advanced anteriorly and  measured a #6 rotating platform, this was pinned in place after careful alignment.  Center hole was then made followed by the keeled cut.  With the tibial tray in place, we used a 12.5 mm bridging bearing corresponding to the size of the femur.  The trial femur was applied and the entire construct was reduced.  Excellent alignment, full extension, and no opening with varus or valgus stress.  Patella was prepared by removing 12 mm of patellar thickness, leaving approximately 17 mm of thickness, 3 holes were then made for the trial patella.  The trial patella was inserted through full range of motion, it remained perfectly stable.  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  We longitudinally incised the capsule and then removed this, irrigated, and then closed longitudinally with 0-Vicryl.  Trial components were removed.  The joint was copiously irrigated with saline solution.  We changed gloves.  The final components were then impacted with polymethyl methacrylate. Initially, the #6 tibial tray followed by the 12.5 mm bridging bearing in the large plus femoral component.  We removed any extraneous methacrylate from the periphery of the components.  Patella was applied with methacrylate and a bone clamp.  At approximately 16 minutes, the methacrylate had matured during which time, we had infiltrated the joint with 0.25% Marcaine with epinephrine. We also used topical tranexamic acid.  At 76 minutes, the tourniquet was deflated.  Any gross bleeders were Bovie coagulated.  Bone wax was used to cover any bleeding bone.  We thought we had a nice dry field.  Medium- size Hemovac was placed in the lateral compartment.  The deep capsule was then closed with a running #1 Ethibond.  I used one single Mitek anchor to reattach the partial avulsion of the patellar tendon from the tibial tubercle and then the superficial capsule was closed with  a running 0-Vicryl, subcu with Vicryl and 3-0 Monocryl, skin closed with skin clips.  Sterile bulky dressing was applied followed by the patient's support stocking.  The patient tolerated the procedure without complications and had excellent alignment.     Vonna Kotyk. Durward Fortes, M.D.     PWW/MEDQ  D:  09/08/2015  T:  09/09/2015  Job:  [id number]

## 2015-09-09 NOTE — Progress Notes (Signed)
Physical Therapy Treatment Patient Details Name: [name] MRN: [id number] DOB: 05-Dec-1936 Today's Date: 09/09/2015    History of Present Illness Pt presents for right TKA with h/o DM, HTN, gout.     PT Comments    Pt progressing well, ambulated 100' with RW and min A, min A given for safety as pt was slightly dizzy after receiving BP meds today. PT will continue to follow.   Follow Up Recommendations  Home health PT;Supervision - Intermittent     Equipment Recommendations  None recommended by PT    Recommendations for Other Services       Precautions / Restrictions Precautions Precautions: Knee Restrictions Weight Bearing Restrictions: Yes RLE Weight Bearing: Partial weight bearing RLE Partial Weight Bearing Percentage or Pounds: 50    Mobility  Bed Mobility Overal bed mobility: Needs Assistance Bed Mobility: Supine to Sit;Sit to Supine     Supine to sit: Supervision Sit to supine: Supervision   General bed mobility comments: able to get in and out of bed with supervision, needed min A to right leg to get into CPM  Transfers Overall transfer level: Needs assistance Equipment used: Rolling walker (2 wheeled) Transfers: Sit to/from Stand Sit to Stand: Min assist         General transfer comment: min A given due to pt's BP being low and feeling slightly dizzy, though this improved with time up. BP 87/59 in sitting  Ambulation/Gait Ambulation/Gait assistance: Min assist Ambulation Distance (Feet): 100 Feet Assistive device: Rolling walker (2 wheeled) Gait Pattern/deviations: Step-through pattern;Decreased weight shift to right;Decreased stance time - right;Decreased step length - right Gait velocity: decreased   General Gait Details: min A for safety   Stairs            Wheelchair Mobility    Modified Rankin (Stroke Patients Only)       Balance Overall balance assessment: No apparent balance deficits (not formally assessed)                                   Cognition Arousal/Alertness: Awake/alert Behavior During Therapy: WFL for tasks assessed/performed Overall Cognitive Status: Within Functional Limits for tasks assessed                      Exercises Total Joint Exercises Ankle Circles/Pumps: AROM;Both;10 reps;Seated Quad Sets: AROM;10 reps;Both;Seated Heel Slides: AROM;10 reps;Right;Seated Straight Leg Raises: AROM;Right;10 reps;Supine Long Arc Quad: AROM;Right;10 reps;Seated Knee Flexion: AROM;Right;5 reps Goniometric ROM: 15-90    General Comments General comments (skin integrity, edema, etc.): BP 87/59 in sitting, RN notified. Pt reports that he takes his BP meds at home only if his BP is high. Had them before session today      Pertinent Vitals/Pain Pain Assessment: 0-10 Pain Score: 1  Pain Location: right knee Pain Intervention(s): Limited activity within patient's tolerance;Premedicated before session    [place] expects to be discharged to:: Private residence Living Arrangements: Spouse/significant other Available Help at Discharge: Family;Available 24 hours/day Type of Home: House Home Access: Ramped entrance;Stairs to enter Entrance Stairs-Rails: Right Home Layout: One level Home Equipment: Walker - 2 wheels;Shower seat;Grab bars - tub/shower;Grab bars - toilet Additional Comments: [place], 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    Prior Function Level of Independence: Independent          PT Goals (current goals can now be found in the  care plan section) Acute Rehab PT Goals 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 PT Goal Formulation: With patient/family Time For Goal Achievement: 09/16/15 Potential to Achieve Goals: Good Progress towards PT goals: Progressing toward goals    Frequency  BID    PT Plan Current plan remains appropriate    Co-evaluation             End of Session Equipment Utilized During Treatment:  Gait belt Activity Tolerance: Patient tolerated treatment well Patient left: in CPM;in bed;with call bell/phone within reach;with family/visitor present     Time: 5947-0761 PT Time Calculation (min) (ACUTE ONLY): 26 min  Charges:  $Gait Training: 8-22 mins $Therapeutic Exercise: 8-22 mins                    G Codes:     Leighton Roach, PT  Acute Rehab Services  [phone]  Leighton Roach 09/09/2015, 1:47 PM

## 2015-09-09 NOTE — Progress Notes (Signed)
Patient ID: [name], male   DOB: 1936-11-21, 79 y.o.   MRN: [id number] PATIENT ID: [name]        MRN:  [id number]          DOB/AGE: 11/12/1936 / 79 y.o.    Joni Fears, MD   Biagio Borg, PA-C [address] [place], [place]  [address]                             [phone]   PROGRESS NOTE  Subjective:  negative for Chest Pain  negative for Shortness of Breath  negative for Nausea/Vomiting   negative for Calf Pain    Tolerating Diet: yes         Patient reports pain as mild.     Sitting up eating breakfast wants to go home  Objective: Vital signs in last 24 hours:   Patient Vitals for the past 24 hrs:  BP Temp Pulse Resp SpO2  09/09/15 0546 127/80 mmHg 98 F (36.7 C) 75 16 97 %  09/09/15 0148 119/78 mmHg 97.3 F (36.3 C) 61 18 97 %  09/08/15 1944 133/74 mmHg 97.5 F (36.4 C) 65 18 98 %  09/08/15 1738 113/76 mmHg - 79 18 98 %  09/08/15 1515 - - (!) 53 16 97 %  09/08/15 1510 - - (!) 49 15 100 %  09/08/15 1500 - - (!) 51 18 100 %  09/08/15 1450 - - (!) 50 15 99 %  09/08/15 1445 - - (!) 55 11 97 %  09/08/15 1440 - - - 14 -  09/08/15 1430 - - (!) 55 17 99 %  09/08/15 1420 - - 62 18 100 %  09/08/15 1415 - - (!) 56 17 97 %  09/08/15 1410 - - (!) 51 15 100 %  09/08/15 1400 - - (!) 51 13 100 %  09/08/15 1350 - - (!) 51 15 100 %  09/08/15 1345 - - (!) 48 13 100 %  09/08/15 1340 - - (!) 48 15 99 %  09/08/15 1330 111/65 mmHg 97.9 F (36.6 C) (!) 55 13 99 %  09/08/15 1315 105/62 mmHg - (!) 53 17 97 %  09/08/15 1300 101/67 mmHg - (!) 51 19 99 %  09/08/15 1245 107/68 mmHg - 63 15 99 %  09/08/15 1236 100/63 mmHg 97.8 F (36.6 C) - 18 99 %  09/08/15 0915 137/68 mmHg - 69 20 97 %  09/08/15 0910 (!) 147/86 mmHg - - 13 -  09/08/15 0905 125/70 mmHg - - 18 -  09/08/15 0900 118/72 mmHg - - 19 -  09/08/15 0840 126/61 mmHg - - 19 -      Intake/Output from previous day:   09/27 0701 - 09/28 0700 In: 1487.5 [P.O.:400; I.V.:1087.5] Out: 1000 [Urine:650;  Drains:350]   Intake/Output this shift:       Intake/Output      09/27 0701 - 09/28 0700 09/28 0701 - 09/29 0700   P.O. 400    I.V. (mL/kg) 1087.5 (10.5)    Total Intake(mL/kg) 1487.5 (14.3)    Urine (mL/kg/hr) 650    Drains 350    Total Output 1000     Net +487.5             LABORATORY DATA:  Recent Labs  09/09/15 0556  WBC 10.9*  HGB 12.6*  HCT 39.2  PLT 181    Recent Labs  09/08/15 0746  NA 136  K 4.2  CL 101  CO2 25  BUN 38*  CREATININE 1.65*  GLUCOSE 100*  CALCIUM 9.5   Lab Results  Component Value Date   INR 1.01 08/27/2015    Recent Radiographic Studies :  Dg Chest 2 View  08/27/2015   CLINICAL DATA:  Diabetes.  Hypertension.  EXAM: CHEST  2 VIEW  COMPARISON:  None.  FINDINGS: Mediastinum hilar structures normal. Lungs are clear. No pleural effusion or pneumothorax. Heart size normal. Degenerative changes thoracic spine .  IMPRESSION: No acute cardiopulmonary disease.   Electronically Signed   By: Marcello Moores  Register   On: 08/27/2015 13:43     Examination:  General appearance: alert, cooperative and mild distress Resp: clear to auscultation bilaterally Cardio: regular rate and rhythm GI: normal findings: bowel sounds normal  Wound Exam: clean, dry, intact   Drainage:  None: wound tissue dry  Motor Exam: EHL, FHL, Anterior Tibial and Posterior Tibial Intact  Sensory Exam: Superficial Peroneal, Deep Peroneal and Tibial normal  Vascular Exam: Right posterior tibial artery has trace pulse   Hemovac 122ml emptied last at 5PM but has had spilling of blood onto bed when hose detached  Assessment:    1 Day Post-Op  Procedure(s) (LRB): TOTAL KNEE ARTHROPLASTY (Right)  ADDITIONAL DIAGNOSIS:  Principal Problem:   Primary osteoarthritis of right knee Active Problems:   Primary osteoarthritis of knee     Plan: Physical Therapy as ordered Touch Down Weight Bearing (TDWB)  DVT Prophylaxis:  Xarelto, Foot Pumps and TED hose  DISCHARGE PLAN:  Home tomorrow  DISCHARGE NEEDS: HHPT, CPM, Walker and 3-in-1 comode seat          PETRARCA,BRIAN  09/09/2015 7:55 AM

## 2015-09-09 NOTE — Progress Notes (Signed)
Orthopedic Tech Progress Note Patient Details:  [name] [birth year] [id number] On cpm at 7:00 pm Patient ID: [name], male   DOB: 02-Feb-1936, 79 y.o.   MRN: [id number]   [name] 09/09/2015, 6:54 PM

## 2015-09-09 NOTE — Evaluation (Signed)
Physical Therapy Evaluation Patient Details Name: [name] MRN: [id number] DOB: Jan 16, 1936 Today's Date: 09/09/2015   History of Present Illness  Pt presents for right TKA with h/o DM, HTN, gout.   Clinical Impression  Pt is s/p TKA resulting in the deficits listed below (see PT Problem List). Pt ambulated 65' with RW and min-guard A on eval.  Pt will benefit from skilled PT to increase their independence and safety with mobility to allow discharge to the venue listed below.      Follow Up Recommendations Home health PT;Supervision - Intermittent    Equipment Recommendations  None recommended by PT    Recommendations for Other Services       Precautions / Restrictions Precautions Precautions: Knee Precaution Comments: reviewed proper positioning Restrictions Weight Bearing Restrictions: Yes RLE Weight Bearing: Partial weight bearing RLE Partial Weight Bearing Percentage or Pounds: 50      Mobility  Bed Mobility Overal bed mobility: Needs Assistance Bed Mobility: Supine to Sit     Supine to sit: Min assist     General bed mobility comments: min A with use of trapeze  Transfers Overall transfer level: Needs assistance Equipment used: Rolling walker (2 wheeled) Transfers: Sit to/from Stand Sit to Stand: Min guard         General transfer comment: min-guard A for safety and vc's for hand placement. Bed raised 3 inches as pt is 6'6" tall  Ambulation/Gait Ambulation/Gait assistance: Min guard Ambulation Distance (Feet): 50 Feet Assistive device: Rolling walker (2 wheeled) Gait Pattern/deviations: Step-to pattern;Decreased weight shift to right;Decreased step length - right Gait velocity: decreased   General Gait Details: vc's for sequencing, and sliding RW rather than picking up  Stairs            Wheelchair Mobility    Modified Rankin (Stroke Patients Only)       Balance Overall balance assessment: No apparent balance deficits (not  formally assessed)                                           Pertinent Vitals/Pain Pain Assessment: 0-10 Pain Score: 1  Pain Location: right knee Pain Intervention(s): Monitored during session    Home Living Family/patient expects to be discharged to:: Private residence Living Arrangements: Spouse/significant other Available Help at Discharge: Family;Available 24 hours/day Type of Home: House Home Access: Ramped entrance;Stairs to enter Entrance Stairs-Rails: Right Entrance Stairs-Number of Steps: 3 Home Layout: One level Home Equipment: Walker - 2 wheels Additional Comments: [place], is very active in [employer] care of it    Prior Function Level of Independence: Independent               Hand Dominance   Dominant Hand: Left    Extremity/Trunk Assessment   Upper Extremity Assessment: Defer to OT evaluation           Lower Extremity Assessment: RLE deficits/detail;LLE deficits/detail RLE Deficits / Details: hip flex 2/5, knee ext 2/5, knee flex 3/5, had knee flexion contracture prior to sx and extreme genu varus. Varus deformity corrected, lacking 15 deg knee ext LLE Deficits / Details: h/o TKA 12 yrs ago, ROM and strength WNL  Cervical / Trunk Assessment: Normal  Communication   Communication: HOH  Cognition Arousal/Alertness: Awake/alert Behavior During Therapy: WFL for tasks assessed/performed Overall Cognitive Status: Within Functional Limits for tasks assessed  General Comments General comments (skin integrity, edema, etc.): O2 sats 97% before and after session, HR 86 bpm    Exercises Total Joint Exercises Ankle Circles/Pumps: AROM;Both;10 reps;Seated Quad Sets: AROM;10 reps;Both;Seated      Assessment/Plan    PT Assessment Patient needs continued PT services  PT Diagnosis Difficulty walking;Abnormality of gait;Acute pain   PT Problem List Decreased strength;Decreased range of  motion;Decreased activity tolerance;Decreased mobility;Decreased knowledge of precautions;Decreased knowledge of use of DME;Pain  PT Treatment Interventions DME instruction;Gait training;Stair training;Functional mobility training;Therapeutic activities;Therapeutic exercise;Patient/family education   PT Goals (Current goals can be found in the Care Plan section) Acute Rehab PT Goals 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 PT Goal Formulation: With patient/family Time For Goal Achievement: 09/16/15 Potential to Achieve Goals: Good    Frequency BID   Barriers to discharge        Co-evaluation               End of Session Equipment Utilized During Treatment: Gait belt Activity Tolerance: Patient tolerated treatment well Patient left: in chair;with call bell/phone within reach;with family/visitor present Nurse Communication: Mobility status         Time: 0824-0858 PT Time Calculation (min) (ACUTE ONLY): 34 min   Charges:   PT Evaluation $Initial PT Evaluation Tier I: 1 Procedure PT Treatments $Gait Training: 8-22 mins   PT G Codes:      Leighton Roach, PT  Acute Rehab Services  [place], [place] 09/09/2015, 9:50 AM

## 2015-09-09 NOTE — Evaluation (Signed)
Occupational Therapy Evaluation Patient Details Name: [name] MRN: [id number] DOB: 18-Dec-1935 Today's Date: 09/09/2015    History of Present Illness Pt presents for right TKA with h/o DM, HTN, gout.    Clinical Impression   Pt currently supervision to min guard assist for most selfcare tasks.  Min assist for donning left sock but feel this should progress quickly.  Pt will have 24 hour supervision from his wife and also has all needed DME.  No further OT needs at this time.      Follow Up Recommendations  No OT follow up    Equipment Recommendations  None recommended by OT    Recommendations for Other Services       Precautions / Restrictions Precautions Precautions: Knee Restrictions Weight Bearing Restrictions: Yes RLE Weight Bearing: Partial weight bearing RLE Partial Weight Bearing Percentage or Pounds: 50      Mobility Bed Mobility Overal bed mobility: Needs Assistance Bed Mobility: Supine to Sit;Sit to Supine     Supine to sit: Supervision Sit to supine: Supervision   General bed mobility comments: able to get in and out of bed with supervision, needed min A to right leg to get into CPM  Transfers Overall transfer level: Needs assistance Equipment used: Rolling walker (2 wheeled) Transfers: Sit to/from Stand Sit to Stand: Min guard         General transfer comment: min A given due to pt's BP being low and feeling slightly dizzy, though this improved with time up. BP 87/59 in sitting    Balance Overall balance assessment: Needs assistance Sitting-balance support: Feet supported Sitting balance-Leahy Scale: Good     Standing balance support: During functional activity Standing balance-Leahy Scale: Fair                              ADL Overall ADL's : Needs assistance/impaired     Grooming: Wash/dry hands;Wash/dry face;Standing;Supervision/safety   Upper Body Bathing: Supervision/ safety;Sitting   Lower Body Bathing: Sit  to/from stand;Supervison/ safety   Upper Body Dressing : Set up;Sitting   Lower Body Dressing: Sit to/from stand;Minimal assistance   Toilet Transfer: Min guard;Regular Toilet;Grab bars   Toileting- Clothing Manipulation and Hygiene: Supervision/safety       Functional mobility during ADLs: Supervision/safety;Rolling walker General ADL Comments: Pt and wife report having elevated toilets and grab bars in the shower as well as beside of the toilet.  Min assist for donning sock on the right foot but feel pt will be able to complete this on his own in a couple of days.  Wife will assist as needed.  No further OT needs at this time.      Vision Vision Assessment?: No apparent visual deficits   Perception Perception Perception Tested?: No   Praxis Praxis Praxis tested?: Not tested    Pertinent Vitals/Pain Pain Assessment: 0-10 Pain Score: 1  Pain Location: right knee Pain Intervention(s): Limited activity within patient's tolerance;Premedicated before session     Hand Dominance Left   Extremity/Trunk Assessment Upper Extremity Assessment Upper Extremity Assessment: Overall WFL for tasks assessed (left shoulder strength 4/5, all other joints WFLs)       Cervical / Trunk Assessment Cervical / Trunk Assessment: Normal   Communication Communication Communication: HOH   Cognition Arousal/Alertness: Awake/alert Behavior During Therapy: WFL for tasks assessed/performed Overall Cognitive Status: Within Functional Limits for tasks assessed  General Comments       Exercises Exercises: Total Joint          Home Living Family/patient expects to be discharged to:: Private residence Living Arrangements: Spouse/significant other Available Help at Discharge: Family;Available 24 hours/day Type of Home: House Home Access: Ramped entrance;Stairs to enter Entrance Stairs-Number of Steps: 3 Entrance Stairs-Rails: Right Home Layout: One level      Bathroom Shower/Tub: Occupational psychologist: Handicapped height     Home Equipment: Environmental consultant - 2 wheels;Shower seat;Grab bars - tub/shower;Grab bars - toilet   Additional Comments: pt [place] farm, 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      Prior Functioning/Environment Level of Independence: Independent                      OT Goals(Current goals can be found in the care plan section) Acute Rehab OT Goals 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  OT Frequency:                End of Session Equipment Utilized During Treatment: Rolling walker CPM Right Knee CPM Right Knee: On Right Knee Flexion (Degrees): 90 Nurse Communication: Mobility status  Activity Tolerance: Patient tolerated treatment well Patient left: in bed;in CPM   Time: 1328-1403 OT Time Calculation (min): 35 min Charges:  OT General Charges $OT Visit: 1 Procedure OT Evaluation $Initial OT Evaluation Tier I: 1 Procedure OT Treatments $Self Care/Home Management : 8-22 mins  MCGUIRE,JAMES OTR/L 09/09/2015, 2:10 PM

## 2015-09-09 NOTE — Discharge Instructions (Signed)
Information on my medicine - XARELTO® (Rivaroxaban) ° °This medication education was reviewed with me or my healthcare representative as part of my discharge preparation.  The pharmacist that spoke with me during my hospital stay was:  Alahna Dunne Kay, RPH ° °Why was Xarelto® prescribed for you? °Xarelto® was prescribed for you to reduce the risk of blood clots forming after orthopedic surgery. The medical term for these abnormal blood clots is venous thromboembolism (VTE). ° °What do you need to know about xarelto® ? °Take your Xarelto® ONCE DAILY at the same time every day. °You may take it either with or without food. ° °If you have difficulty swallowing the tablet whole, you may crush it and mix in applesauce just prior to taking your dose. ° °Take Xarelto® exactly as prescribed by your doctor and DO NOT stop taking Xarelto® without talking to the doctor who prescribed the medication.  Stopping without other VTE prevention medication to take the place of Xarelto® may increase your risk of developing a clot. ° °After discharge, you should have regular check-up appointments with your healthcare provider that is prescribing your Xarelto®.   ° °What do you do if you miss a dose? °If you miss a dose, take it as soon as you remember on the same day then continue your regularly scheduled once daily regimen the next day. Do not take two doses of Xarelto® on the same day.  ° °Important Safety Information °A possible side effect of Xarelto® is bleeding. You should call your healthcare provider right away if you experience any of the following: °? Bleeding from an injury or your nose that does not stop. °? Unusual colored urine (red or dark brown) or unusual colored stools (red or black). °? Unusual bruising for unknown reasons. °? A serious fall or if you hit your head (even if there is no bleeding). ° °Some medicines may interact with Xarelto® and might increase your risk of bleeding while on Xarelto®. To help avoid  this, consult your healthcare provider or pharmacist prior to using any new prescription or non-prescription medications, including herbals, vitamins, non-steroidal anti-inflammatory drugs (NSAIDs) and supplements. ° °This website has more information on Xarelto®: www.xarelto.com. ° ° °

## 2015-09-10 LAB — CBC
HCT: 32.8 % — ABNORMAL LOW (ref 39.0–52.0)
Hemoglobin: 10.6 g/dL — ABNORMAL LOW (ref 13.0–17.0)
MCH: 28.3 pg (ref 26.0–34.0)
MCHC: 32.3 g/dL (ref 30.0–36.0)
MCV: 87.7 fL (ref 78.0–100.0)
PLATELETS: 166 10*3/uL (ref 150–400)
RBC: 3.74 MIL/uL — AB (ref 4.22–5.81)
RDW: 15.4 % (ref 11.5–15.5)
WBC: 12.5 10*3/uL — AB (ref 4.0–10.5)

## 2015-09-10 LAB — GLUCOSE, CAPILLARY
GLUCOSE-CAPILLARY: 70 mg/dL (ref 65–99)
GLUCOSE-CAPILLARY: 67 mg/dL (ref 65–99)
GLUCOSE-CAPILLARY: 85 mg/dL (ref 65–99)

## 2015-09-10 LAB — BASIC METABOLIC PANEL
ANION GAP: 6 (ref 5–15)
BUN: 31 mg/dL — ABNORMAL HIGH (ref 6–20)
CALCIUM: 8.7 mg/dL — AB (ref 8.9–10.3)
CO2: 24 mmol/L (ref 22–32)
Chloride: 110 mmol/L (ref 101–111)
Creatinine, Ser: 1.71 mg/dL — ABNORMAL HIGH (ref 0.61–1.24)
GFR, EST AFRICAN AMERICAN: 43 mL/min — AB (ref >60)
GFR, EST NON AFRICAN AMERICAN: 37 mL/min — AB (ref >60)
GLUCOSE: 84 mg/dL (ref 65–99)
Potassium: 4.7 mmol/L (ref 3.5–5.1)
SODIUM: 140 mmol/L (ref 135–145)

## 2015-09-10 MED ORDER — RIVAROXABAN 10 MG PO TABS: 10.0000 mg | ORAL_TABLET | Freq: Every day | ORAL | Status: DC

## 2015-09-10 MED ORDER — CEPHALEXIN 500 MG PO CAPS
500.0000 mg | ORAL_CAPSULE | Freq: Three times a day (TID) | ORAL | Status: DC
Start: 2015-09-10 — End: 2016-02-08

## 2015-09-10 MED ORDER — OXYCODONE HCL 5 MG PO TABS: 5.0000 mg | ORAL_TABLET | ORAL | Status: DC | PRN

## 2015-09-10 MED ORDER — METHOCARBAMOL 500 MG PO TABS: 500.0000 mg | ORAL_TABLET | Freq: Three times a day (TID) | ORAL | Status: DC | PRN

## 2015-09-10 MED ORDER — ASPIRIN EC 325 MG PO TBEC: 325.0000 mg | DELAYED_RELEASE_TABLET | Freq: Every day | ORAL | Status: DC

## 2015-09-10 NOTE — Care Management Note (Signed)
Case Management Note  Patient Details  Name: [name] MRN: [id number] Date of Birth: [birth year]  Subjective/Objective:  79 yr old male s/p right total knee arthroplasty.                   Action/Plan: Patient was preoperatively setup with [facility], no changes. DME has been delivered. Patient will have support at discharge.   Expected Discharge Date:   09/10/15               Expected Discharge Plan:  [place]  In-House Referral:  NA  Discharge planning Services  CM Consult  Post Acute Care Choice:  Home Health Choice offered to:     DME Arranged:  Walker rolling, CPM DME Agency:  [employer]  HH Arranged:  PT HH Agency:  [facility]  Status of Service:  Completed, signed off  Medicare Important Message Given:    Date Medicare IM Given:    Medicare IM give by:    Date Additional Medicare IM Given:    Additional Medicare Important Message give by:     If discussed at [place] of Stay Meetings, dates discussed:    Additional Comments:  Ninfa Meeker, RN 09/10/2015, 4:00 PM

## 2015-09-10 NOTE — Progress Notes (Signed)
PT Cancellation Note  Patient Details Name: [name] MRN: [id number] DOB: Aug 04, 1936   Cancelled Treatment:    Reason Eval/Treat Not Completed: Medical issues which prohibited therapy. Per RN, pt bleeding through dressing and is awaiting dressing change per MD.  Hold mobility pending dressing change.  PT will continue to follow acutely and will visit pt again as time permits.  Joslyn Hy PT, DPT [phone] Pager: [phone] 09/10/2015, 3:34 PM

## 2015-09-10 NOTE — Progress Notes (Signed)
PHARMACIST - PHYSICIAN COMMUNICATION  CONCERNING:  METFORMIN SAFE ADMINISTRATION POLICY  RECOMMENDATION: Metformin has been placed on DISCONTINUE (rejected order) STATUS and should be reordered only after any of the conditions below are ruled out.  Current safety recommendations include avoiding metformin for a minimum of 48 hours after the patient's exposure to intravenous contrast media.  DESCRIPTION:  The Pharmacy Committee has adopted a policy that restricts the use of metformin in hospitalized patients until all the following conditions have been ruled out.  Specific contraindications are:  '[x]'  eGFR is below 30 ml/minute '[]'  Planned administration of intravenous iodinated contrast media '[]'  Acute or chronic metabolic acidosis (including DKA)  '[]'  Shock, acute MI, sepsis, hypoxemia, dehydration '[]'  Heart Failure patients with low EF       Tad Moore, [facility] 09/10/2015 8:41 AM

## 2015-09-10 NOTE — Progress Notes (Signed)
Orthopedic Tech Progress Note Patient Details:  [name] 1936/08/13 [id number] Applied Velcro knee immobilizer to RLE.  Pulses, sensation, motion intact before and after application.  Capillary refill less than 2 seconds before and after application. Ortho Devices Type of Ortho Device: Knee Immobilizer Ortho Device/Splint Location: RLE Ortho Device/Splint Interventions: Application   Darrol Poke 09/10/2015, 4:23 PM

## 2015-09-10 NOTE — Progress Notes (Signed)
Physical Therapy Treatment Patient Details Name: [name] MRN: [id number] DOB: 04/20/1936 Today's Date: 09/10/2015    History of Present Illness Pt presents for right TKA with h/o DM, HTN, gout.     PT Comments    Pt eager to d/c today.  PT will attempt to see again this afternoon as time permits prior to d/c.  He is progressing well but requires mod verbal cues for technique using RW w/ ambulation.    Follow Up Recommendations  Home health PT;Supervision - Intermittent     Equipment Recommendations  None recommended by PT    Recommendations for Other Services       Precautions / Restrictions Precautions Precautions: Knee Precaution Comments: Reviewed knee precautions Restrictions Weight Bearing Restrictions: Yes RLE Weight Bearing: Partial weight bearing RLE Partial Weight Bearing Percentage or Pounds: 50    Mobility  Bed Mobility Overal bed mobility: Modified Independent Bed Mobility: Supine to Sit     Supine to sit: Supervision     General bed mobility comments: No cues or assist necessary  Transfers Overall transfer level: Needs assistance Equipment used: Rolling walker (2 wheeled) Transfers: Sit to/from Stand Sit to Stand: Min guard         General transfer comment: Min guard for safety.  Pt able to rise from bed at lowest setting.  Cues for hand placement  Ambulation/Gait Ambulation/Gait assistance: Supervision Ambulation Distance (Feet): 100 Feet Assistive device: Rolling walker (2 wheeled) Gait Pattern/deviations: Step-to pattern;Antalgic;Trunk flexed;Decreased weight shift to right;Decreased stride length;Decreased stance time - right Gait velocity: decreased Gait velocity interpretation: Below normal speed for age/gender General Gait Details: Supervision for safety.  Cues to stand upright.  Pt w/ step to gait pattern.  Requires cues to push RW 1 foot in front of him as he continues to run into it when stepping.   Stairs             Wheelchair Mobility    Modified Rankin (Stroke Patients Only)       Balance Overall balance assessment: Needs assistance Sitting-balance support: No upper extremity supported;Feet supported Sitting balance-Leahy Scale: Good     Standing balance support: Bilateral upper extremity supported;During functional activity Standing balance-Leahy Scale: Fair                      Cognition Arousal/Alertness: Awake/alert Behavior During Therapy: WFL for tasks assessed/performed Overall Cognitive Status: Within Functional Limits for tasks assessed                      Exercises Total Joint Exercises Ankle Circles/Pumps: AROM;Both;10 reps;Seated Quad Sets: AROM;10 reps;Both;Seated Straight Leg Raises: 10 reps;Supine;Strengthening;Right Long Arc Quad: AROM;Right;10 reps;Seated Knee Flexion: AROM;Right;5 reps Goniometric ROM: 10-96    General Comments General comments (skin integrity, edema, etc.): Pt eager to d/c today.  PT will attempt to see again this afternoon as time permits.      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 PT Goal  Formulation: With patient/family Time For Goal Achievement: 09/16/15 Potential to Achieve Goals: Good Progress towards PT goals: Progressing toward goals    Frequency  7X/week    PT Plan Current plan remains appropriate    Co-evaluation             End of Session Equipment Utilized During Treatment: Gait belt Activity Tolerance: Patient tolerated treatment well Patient left: with family/visitor present;in  chair;with call bell/phone within reach     Time: 1031-1056 PT Time Calculation (min) (ACUTE ONLY): 25 min  Charges:  $Gait Training: 8-22 mins $Therapeutic Exercise: 8-22 mins                    G Codes:      Joslyn Hy PT, DPT [phone] Pager: [phone] 09/10/2015, 11:13 AM

## 2015-09-10 NOTE — Progress Notes (Signed)
Subjective: 2 Days Post-Op Procedure(s) (LRB): TOTAL KNEE ARTHROPLASTY (Right) Patient reports pain as mild and moderate.    Objective: Vital signs in last 24 hours: Temp:  [98.3 F (36.8 C)-98.6 F (37 C)] 98.6 F (37 C) (09/29 0556) Pulse Rate:  [78-101] 78 (09/29 0556) Resp:  [18] 18 (09/29 0556) BP: (92-103)/(51-71) 101/71 mmHg (09/29 0556) SpO2:  [94 %-99 %] 96 % (09/29 0556)  Intake/Output from previous day: 09/28 0701 - 09/29 0700 In: 960 [P.O.:960] Out: 475 [Drains:475] Intake/Output this shift: Total I/O In: 240 [P.O.:240] Out: -    Recent Labs  09/09/15 0556 09/10/15 0340  HGB 12.6* 10.6*    Recent Labs  09/09/15 0556 09/10/15 0340  WBC 10.9* 12.5*  RBC 4.50 3.74*  HCT 39.2 32.8*  PLT 181 166    Recent Labs  09/09/15 0556 09/10/15 0340  NA 137 140  K 4.9 4.7  CL 106 110  CO2 24 24  BUN 29* 31*  CREATININE 1.50* 1.71*  GLUCOSE 121* 84  CALCIUM 9.0 8.7*   No results for input(s): LABPT, INR in the last 72 hours.  Neurovascular intact Sensation intact distally Dorsiflexion/Plantar flexion intact Incision: dressing C/D/I and no drainage  Assessment/Plan: 2 Days Post-Op Procedure(s) (LRB): TOTAL KNEE ARTHROPLASTY (Right) Advance diet Discharge home with home health  [facility] 09/10/2015, 12:47 PM

## 2015-09-10 NOTE — Discharge Summary (Signed)
Joni Fears, MD   Biagio Borg, PA-C [address] [place], [place]  [address]                             [phone]  PATIENT ID: ALVIN DIFFEE        MRN:  [id number]          DOB/AGE: 1936/09/29 / 79 y.o.    DISCHARGE SUMMARY  ADMISSION DATE:    09/08/2015 DISCHARGE DATE:   09/10/2015   ADMISSION DIAGNOSIS: OSTEOARTHRITIS OF RIGHT KNEE    DISCHARGE DIAGNOSIS:  OSTEOARTHRITIS OF RIGHT KNEE    ADDITIONAL DIAGNOSIS: Principal Problem:   Primary osteoarthritis of right knee Active Problems:   Primary osteoarthritis of knee  Past Medical History  Diagnosis Date  . Gout   . Diabetes mellitus, type 2   . Hyperlipidemia   . Hypertension   . CLAUDICATION 08/12/2009  . DIABETES MELLITUS, TYPE II 08/09/2007  . ERECTILE DYSFUNCTION 02/10/2009  . FLANK PAIN, LEFT 07/05/2010  . GOUT 08/09/2007  . HYPERLIPIDEMIA 08/09/2007  . HYPERTENSION 08/09/2007  . 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 ROTATOR CUFF SYNDROME, RIGHT 08/11/2008  . Allergic rhinitis, cause unspecified 07/22/2014  . Arthritis     gout- elbow, knees   . Cancer     facial - nose, lip   . NEPHROLITHIASIS 07/05/2010    PROCEDURE: Procedure(s): TOTAL KNEE ARTHROPLASTY Right on 09/08/2015  CONSULTS: none     HISTORY: Azavier is a 79 year old white male who is seen today for evaluation of his right knee. He has been seen previously for the right knee and has had problems. Back in July 2016, he had developed severe pain in the right knee after riding an exercise bike. It had gotten to the point where he could not bend his knee at all. It was really uncomfortable for about 24 hours. He tried using Advil and Tylenol, and it finally had subsided. He has been able to walk, but has a limp and occasionally does have discomfort. He does have significant arthritis of the knee clinically, as well as radiographically. He has had a prior left total knee replacement with excellent results. However, now he is complaining that he is  having problems with activities of daily living. He has also some nighttime pain, which is also bothersome to him. He does have pain with ambulation. He does try to get on an exercise bike and tries to stay strong, but this is also causing him some discomfort. It has now gotten to the point where he is considering total knee replacement.   HOSPITAL COURSE:  ARMOND CUTHRELL is a 79 y.o. admitted on 09/08/2015 and found to have a diagnosis of OSTEOARTHRITIS OF RIGHT KNEE.  After appropriate laboratory studies were obtained  they were taken to the operating room on 09/08/2015 and underwent  Procedure(s): TOTAL KNEE ARTHROPLASTY  Right.   They were given perioperative antibiotics:  Anti-infectives    Start     Dose/Rate Route Frequency Ordered Stop   09/08/15 1630  ceFAZolin (ANCEF) IVPB 2 g/50 mL premix     2 g 100 mL/hr over 30 Minutes Intravenous Every 6 hours 09/08/15 1602 09/08/15 2209   09/08/15 0900  ceFAZolin (ANCEF) IVPB 2 g/50 mL premix     2 g 100 mL/hr over 30 Minutes Intravenous To ShortStay Surgical 09/07/15 1417 09/08/15 1020    .  Tolerated the procedure well.    Toradol was given  post op.  POD #1, allowed out of bed to a chair.  PT for ambulation and exercise program.  IV saline locked.  O2 discontionued.  POD #2, continued PT and ambulation.   Hemovac pulled.  The remainder of the hospital course was dedicated to ambulation and strengthening.   The patient was discharged on 2 Days Post-Op in  Stable condition.  Blood products given:none  DIAGNOSTIC STUDIES: Recent vital signs: Patient Vitals for the past 24 hrs:  BP Temp Temp src Pulse Resp SpO2  09/10/15 0556 101/71 mmHg 98.6 F (37 C) Oral 78 18 96 %  09/09/15 2000 (!) 99/53 mmHg 98.3 F (36.8 C) - 96 18 99 %  09/09/15 1730 (!) 94/53 mmHg - - 97 - -  09/09/15 1727 (!) 92/51 mmHg - - - - -  09/09/15 1400 103/62 mmHg 98.6 F (37 C) - (!) 101 18 94 %       Recent laboratory studies:  Recent Labs   09/09/15 0556 09/10/15 0340  WBC 10.9* 12.5*  HGB 12.6* 10.6*  HCT 39.2 32.8*  PLT 181 166    Recent Labs  09/08/15 0746 09/09/15 0556 09/10/15 0340  NA 136 137 140  K 4.2 4.9 4.7  CL 101 106 110  CO2 25 24 24   BUN 38* 29* 31*  CREATININE 1.65* 1.50* 1.71*  GLUCOSE 100* 121* 84  CALCIUM 9.5 9.0 8.7*   Lab Results  Component Value Date   INR 1.01 08/27/2015     Recent Radiographic Studies :  Dg Chest 2 View  08/27/2015   CLINICAL DATA:  Diabetes.  Hypertension.  EXAM: CHEST  2 VIEW  COMPARISON:  None.  FINDINGS: Mediastinum hilar structures normal. Lungs are clear. No pleural effusion or pneumothorax. Heart size normal. Degenerative changes thoracic spine .  IMPRESSION: No acute cardiopulmonary disease.   Electronically Signed   By: Marcello Moores  Register   On: 08/27/2015 13:43    DISCHARGE INSTRUCTIONS: Discharge Instructions    CPM    Complete by:  As directed   Continuous passive motion machine (CPM):      Use the CPM from 0 to 60 for 6-8 hours per day.      You may increase by 5-10 degrees per day.  You may break it up into 2 or 3 sessions per day.      Use CPM for 3-4  weeks or until you are told to stop.     Call MD / Call 911    Complete by:  As directed   If you experience chest pain or shortness of breath, CALL 911 and be transported to the hospital emergency room.  If you develope a fever above 101 F, pus (white drainage) or increased drainage or redness at the wound, or calf pain, call your surgeon's office.     Change dressing    Complete by:  As directed   DO NOT CHANGE YOUR DRESSING     Constipation Prevention    Complete by:  As directed   Drink plenty of fluids.  Prune juice may be helpful.  You may use a stool softener, such as Colace (over the counter) 100 mg twice a day.  Use MiraLax (over the counter) for constipation as needed.     Diet Carb Modified    Complete by:  As directed      Discharge instructions    Complete by:  As directed   INSTRUCTIONS  AFTER JOINT REPLACEMENT   Remove items at home  which could result in a fall. This includes throw rugs or furniture in walking pathways ICE to the affected joint every three hours while awake for 30 minutes at a time, for at least the first 3-5 days, and then as needed for pain and swelling.  Continue to use ice for pain and swelling. You may notice swelling that will progress down to the foot and ankle.  This is normal after surgery.  Elevate your leg when you are not up walking on it.   Continue to use the breathing machine you got in the hospital (incentive spirometer) which will help keep your temperature down.  It is common for your temperature to cycle up and down following surgery, especially at night when you are not up moving around and exerting yourself.  The breathing machine keeps your lungs expanded and your temperature down.   DIET:  As you were doing prior to hospitalization, we recommend a well-balanced diet.  DRESSING / WOUND CARE / SHOWERING  Keep the surgical dressing until follow up.  The dressing is water proof, so you can shower without any extra covering.  IF THE DRESSING FALLS OFF or the wound gets wet inside, change the dressing with sterile gauze.  Please use good hand washing techniques before changing the dressing.  Do not use any lotions or creams on the incision until instructed by your surgeon.    ACTIVITY  Increase activity slowly as tolerated, but follow the weight bearing instructions below.   No driving for 6 weeks or until further direction given by your physician.  You cannot drive while taking narcotics.  No lifting or carrying greater than 10 lbs. until further directed by your surgeon. Avoid periods of inactivity such as sitting longer than an hour when not asleep. This helps prevent blood clots.  You may return to work once you are authorized by your doctor.     WEIGHT BEARING   Partial weight bearing with assist device as directed.  50% as taught in  PT   EXERCISES  Results after joint replacement surgery are often greatly improved when you follow the exercise, range of motion and muscle strengthening exercises prescribed by your doctor. Safety measures are also important to protect the joint from further injury. Any time any of these exercises cause you to have increased pain or swelling, decrease what you are doing until you are comfortable again and then slowly increase them. If you have problems or questions, call your caregiver or physical therapist for advice.   Rehabilitation is important following a joint replacement. After just a few days of immobilization, the muscles of the leg can become weakened and shrink (atrophy).  These exercises are designed to build up the tone and strength of the thigh and leg muscles and to improve motion. Often times heat used for twenty to thirty minutes before working out will loosen up your tissues and help with improving the range of motion but do not use heat for the first two weeks following surgery (sometimes heat can increase post-operative swelling).   These exercises can be done on a training (exercise) mat, on the floor, on a table or on a bed. Use whatever works the best and is most comfortable for you.    Use music or television while you are exercising so that the exercises are a pleasant break in your day. This will make your life better with the exercises acting as a break in your routine that you can look forward to.  Perform all exercises about fifteen times, three times per day or as directed.  You should exercise both the operative leg and the other leg as well.   Exercises include:   Quad Sets - Tighten up the muscle on the front of the thigh (Quad) and hold for 5-10 seconds.   Straight Leg Raises - With your knee straight (if you were given a brace, keep it on), lift the leg to 60 degrees, hold for 3 seconds, and slowly lower the leg.  Perform this exercise against resistance later as  your leg gets stronger.  Leg Slides: Lying on your back, slowly slide your foot toward your buttocks, bending your knee up off the floor (only go as far as is comfortable). Then slowly slide your foot back down until your leg is flat on the floor again.  Angel Wings: Lying on your back spread your legs to the side as far apart as you can without causing discomfort.  Hamstring Strength:  Lying on your back, push your heel against the floor with your leg straight by tightening up the muscles of your buttocks.  Repeat, but this time bend your knee to a comfortable angle, and push your heel against the floor.  You may put a pillow under the heel to make it more comfortable if necessary.   A rehabilitation program following joint replacement surgery can speed recovery and prevent re-injury in the future due to weakened muscles. Contact your doctor or a physical therapist for more information on knee rehabilitation.    CONSTIPATION  Constipation is defined medically as fewer than three stools per week and severe constipation as less than one stool per week.  Even if you have a regular bowel pattern at home, your normal regimen is likely to be disrupted due to multiple reasons following surgery.  Combination of anesthesia, postoperative narcotics, change in appetite and fluid intake all can affect your bowels.   YOU MUST use at least one of the following options; they are listed in order of increasing strength to get the job done.  They are all available over the counter, and you may need to use some, POSSIBLY even all of these options:    Drink plenty of fluids (prune juice may be helpful) and high fiber foods Colace 100 mg by mouth twice a day  Senokot for constipation as directed and as needed Dulcolax (bisacodyl), take with full glass of water  Miralax (polyethylene glycol) once or twice a day as needed.  If you have tried all these things and are unable to have a bowel movement in the first 3-4 days  after surgery call either your surgeon or your primary doctor.    If you experience loose stools or diarrhea, hold the medications until you stool forms back up.  If your symptoms do not get better within 1 week or if they get worse, check with your doctor.  If you experience "the worst abdominal pain ever" or develop nausea or vomiting, please contact the office immediately for further recommendations for treatment.   ITCHING:  If you experience itching with your medications, try taking only a single pain pill, or even half a pain pill at a time.  You can also use Benadryl over the counter for itching or also to help with sleep.   TED HOSE STOCKINGS:  Use stockings on both legs until for at least 2 weeks or as directed by physician office. They may be removed at night for sleeping.  MEDICATIONS:  See your medication summary on the "After Visit Summary" that nursing will review with you.  You may have some home medications which will be placed on hold until you complete the course of blood thinner medication.  It is important for you to complete the blood thinner medication as prescribed.  PRECAUTIONS:  If you experience chest pain or shortness of breath - call 911 immediately for transfer to the hospital emergency department.   If you develop a fever greater that 101 F, purulent drainage from wound, increased redness or drainage from wound, foul odor from the wound/dressing, or calf pain - CONTACT YOUR SURGEON.                                                   FOLLOW-UP APPOINTMENTS:  If you do not already have a post-op appointment, please call the office for an appointment to be seen by your surgeon.  Guidelines for how soon to be seen are listed in your "After Visit Summary", but are typically between 1-4 weeks after surgery.  OTHER INSTRUCTIONS:   Knee Replacement:  Do not place pillow under knee, focus on keeping the knee straight while resting. CPM instructions: 0-90 degrees, 2 hours in the  morning, 2 hours in the afternoon, and 2 hours in the evening. Place foam block, curve side up under heel at all times except when in CPM or when walking.  DO NOT modify, tear, cut, or change the foam block in any way.  MAKE SURE YOU:  Understand these instructions.  Get help right away if you are not doing well or get worse.    Thank you for letting us be a part of your medical care team.  It is a privilege we respect greatly.  We hope these instructions will help you stay on track for a fast and full recovery!     Do not put a pillow under the knee. Place it under the heel.    Complete by:  As directed      Driving restrictions    Complete by:  As directed   No driving for 6 weeks     Increase activity slowly as tolerated    Complete by:  As directed      Lifting restrictions    Complete by:  As directed   No lifting for 6 weeks     Partial weight bearing    Complete by:  As directed   % Body Weight:  50%  Laterality:  right  Extremity:  Lower     Patient may shower    Complete by:  As directed   You may shower over the brown dressing     TED hose    Complete by:  As directed   Use stockings (TED hose) for 2-3 weeks on right leg.  You may remove them at night for sleeping.           DISCHARGE MEDICATIONS:     Medication List    STOP taking these medications        colchicine 0.6 MG tablet     ibuprofen 200 MG tablet  Commonly known as:  ADVIL,MOTRIN     ONE TOUCH ULTRA TEST test strip  Generic drug:  glucose blood     SYRINGE-NEEDLE (DISP) 3 ML 22G X 1" 3 ML Misc  Commonly  known as:  B-D 3CC LUER-LOK SYR 22GX1"     VIAGRA 100 MG tablet  Generic drug:  sildenafil      TAKE these medications        amLODipine 5 MG tablet  Commonly known as:  NORVASC  Take 1 tablet (5 mg total) by mouth daily.     atorvastatin 40 MG tablet  Commonly known as:  LIPITOR  TAKE 1 TABLET BY MOUTH DAILY     fexofenadine 180 MG tablet  Commonly known as:  ALLEGRA  Take 1  tablet (180 mg total) by mouth daily.     fluticasone 50 MCG/ACT nasal spray  Commonly known as:  FLONASE  Place 2 sprays into both nostrils daily.     furosemide 20 MG tablet  Commonly known as:  LASIX  Take 1 tablet (20 mg total) by mouth daily.     glipiZIDE 10 MG 24 hr tablet  Commonly known as:  GLUCOTROL XL  Take 1 tablet (10 mg total) by mouth daily with breakfast.     lisinopril 10 MG tablet  Commonly known as:  PRINIVIL,ZESTRIL  Take 1 tablet (10 mg total) by mouth daily.     metFORMIN 1000 MG tablet  Commonly known as:  GLUCOPHAGE  Take 1 tablet (1,000 mg total) by mouth 2 (two) times daily with a meal.     methocarbamol 500 MG tablet  Commonly known as:  ROBAXIN  Take 1 tablet (500 mg total) by mouth every 8 (eight) hours as needed for muscle spasms.     oxyCODONE 5 MG immediate release tablet  Commonly known as:  Oxy IR/ROXICODONE  Take 1-2 tablets (5-10 mg total) by mouth every 4 (four) hours as needed for moderate pain or severe pain.     ranitidine 150 MG tablet  Commonly known as:  ZANTAC  Take 150 mg by mouth 2 (two) times daily as needed for heartburn.     rivaroxaban 10 MG Tabs tablet  Commonly known as:  XARELTO  Take 1 tablet (10 mg total) by mouth daily with breakfast.     saxagliptin HCl 5 MG Tabs tablet  Commonly known as:  ONGLYZA  Take 1 tablet (5 mg total) by mouth daily.     testosterone cypionate 200 MG/ML injection  Commonly known as:  DEPOTESTOSTERONE CYPIONATE  INJECT 1 ML IM EVERY 14 DAYS        FOLLOW UP VISIT:       Follow-up Information    Follow up with [facility], Vonna Kotyk, MD. Schedule an appointment as soon as possible for a visit on 09/21/2015.   Specialty:  Orthopedic Surgery   Contact information:   [address] [place] [address] [phone]       DISPOSITION:   Home today  CONDITION:  Stable   Mike Craze. [place], [place] [phone]  09/10/2015 12:53 PM

## 2015-09-11 ENCOUNTER — Telehealth: Payer: Self-pay | Admitting: *Deleted

## 2015-09-11 DIAGNOSIS — E119 Type 2 diabetes mellitus without complications: Secondary | ICD-10-CM | POA: Diagnosis not present

## 2015-09-11 DIAGNOSIS — I10 Essential (primary) hypertension: Secondary | ICD-10-CM | POA: Diagnosis not present

## 2015-09-11 DIAGNOSIS — Z96651 Presence of right artificial knee joint: Secondary | ICD-10-CM | POA: Diagnosis not present

## 2015-09-11 DIAGNOSIS — Z471 Aftercare following joint replacement surgery: Secondary | ICD-10-CM | POA: Diagnosis not present

## 2015-09-11 DIAGNOSIS — M109 Gout, unspecified: Secondary | ICD-10-CM | POA: Diagnosis not present

## 2015-09-11 NOTE — Telephone Encounter (Signed)
Pt was on tcm list d/C 09/10/15 had (R) knee repalcement. Pt will f/u with surgeon Dr. Durward Fortes on 09/21/15...Johny Chess

## 2015-09-12 DIAGNOSIS — E119 Type 2 diabetes mellitus without complications: Secondary | ICD-10-CM | POA: Diagnosis not present

## 2015-09-12 DIAGNOSIS — Z471 Aftercare following joint replacement surgery: Secondary | ICD-10-CM | POA: Diagnosis not present

## 2015-09-12 DIAGNOSIS — I10 Essential (primary) hypertension: Secondary | ICD-10-CM | POA: Diagnosis not present

## 2015-09-12 DIAGNOSIS — M109 Gout, unspecified: Secondary | ICD-10-CM | POA: Diagnosis not present

## 2015-09-12 DIAGNOSIS — Z96651 Presence of right artificial knee joint: Secondary | ICD-10-CM | POA: Diagnosis not present

## 2015-09-14 DIAGNOSIS — I10 Essential (primary) hypertension: Secondary | ICD-10-CM | POA: Diagnosis not present

## 2015-09-14 DIAGNOSIS — M109 Gout, unspecified: Secondary | ICD-10-CM | POA: Diagnosis not present

## 2015-09-14 DIAGNOSIS — Z471 Aftercare following joint replacement surgery: Secondary | ICD-10-CM | POA: Diagnosis not present

## 2015-09-14 DIAGNOSIS — E119 Type 2 diabetes mellitus without complications: Secondary | ICD-10-CM | POA: Diagnosis not present

## 2015-09-14 DIAGNOSIS — Z96651 Presence of right artificial knee joint: Secondary | ICD-10-CM | POA: Diagnosis not present

## 2015-09-16 DIAGNOSIS — Z471 Aftercare following joint replacement surgery: Secondary | ICD-10-CM | POA: Diagnosis not present

## 2015-09-16 DIAGNOSIS — I10 Essential (primary) hypertension: Secondary | ICD-10-CM | POA: Diagnosis not present

## 2015-09-16 DIAGNOSIS — E119 Type 2 diabetes mellitus without complications: Secondary | ICD-10-CM | POA: Diagnosis not present

## 2015-09-16 DIAGNOSIS — M109 Gout, unspecified: Secondary | ICD-10-CM | POA: Diagnosis not present

## 2015-09-16 DIAGNOSIS — Z96651 Presence of right artificial knee joint: Secondary | ICD-10-CM | POA: Diagnosis not present

## 2015-09-18 DIAGNOSIS — M109 Gout, unspecified: Secondary | ICD-10-CM | POA: Diagnosis not present

## 2015-09-18 DIAGNOSIS — Z471 Aftercare following joint replacement surgery: Secondary | ICD-10-CM | POA: Diagnosis not present

## 2015-09-18 DIAGNOSIS — E119 Type 2 diabetes mellitus without complications: Secondary | ICD-10-CM | POA: Diagnosis not present

## 2015-09-18 DIAGNOSIS — I10 Essential (primary) hypertension: Secondary | ICD-10-CM | POA: Diagnosis not present

## 2015-09-18 DIAGNOSIS — Z96651 Presence of right artificial knee joint: Secondary | ICD-10-CM | POA: Diagnosis not present

## 2015-09-21 DIAGNOSIS — E119 Type 2 diabetes mellitus without complications: Secondary | ICD-10-CM | POA: Diagnosis not present

## 2015-09-21 DIAGNOSIS — Z96651 Presence of right artificial knee joint: Secondary | ICD-10-CM | POA: Diagnosis not present

## 2015-09-21 DIAGNOSIS — M109 Gout, unspecified: Secondary | ICD-10-CM | POA: Diagnosis not present

## 2015-09-21 DIAGNOSIS — Z471 Aftercare following joint replacement surgery: Secondary | ICD-10-CM | POA: Diagnosis not present

## 2015-09-21 DIAGNOSIS — I10 Essential (primary) hypertension: Secondary | ICD-10-CM | POA: Diagnosis not present

## 2015-09-22 DIAGNOSIS — M1711 Unilateral primary osteoarthritis, right knee: Secondary | ICD-10-CM | POA: Diagnosis not present

## 2015-09-23 DIAGNOSIS — Z96651 Presence of right artificial knee joint: Secondary | ICD-10-CM | POA: Diagnosis not present

## 2015-09-23 DIAGNOSIS — M109 Gout, unspecified: Secondary | ICD-10-CM | POA: Diagnosis not present

## 2015-09-23 DIAGNOSIS — E119 Type 2 diabetes mellitus without complications: Secondary | ICD-10-CM | POA: Diagnosis not present

## 2015-09-23 DIAGNOSIS — Z471 Aftercare following joint replacement surgery: Secondary | ICD-10-CM | POA: Diagnosis not present

## 2015-09-23 DIAGNOSIS — I10 Essential (primary) hypertension: Secondary | ICD-10-CM | POA: Diagnosis not present

## 2015-09-25 DIAGNOSIS — Z96651 Presence of right artificial knee joint: Secondary | ICD-10-CM | POA: Diagnosis not present

## 2015-09-25 DIAGNOSIS — E119 Type 2 diabetes mellitus without complications: Secondary | ICD-10-CM | POA: Diagnosis not present

## 2015-09-25 DIAGNOSIS — I10 Essential (primary) hypertension: Secondary | ICD-10-CM | POA: Diagnosis not present

## 2015-09-25 DIAGNOSIS — M109 Gout, unspecified: Secondary | ICD-10-CM | POA: Diagnosis not present

## 2015-09-25 DIAGNOSIS — Z471 Aftercare following joint replacement surgery: Secondary | ICD-10-CM | POA: Diagnosis not present

## 2015-09-28 DIAGNOSIS — M109 Gout, unspecified: Secondary | ICD-10-CM | POA: Diagnosis not present

## 2015-09-28 DIAGNOSIS — I10 Essential (primary) hypertension: Secondary | ICD-10-CM | POA: Diagnosis not present

## 2015-09-28 DIAGNOSIS — Z96651 Presence of right artificial knee joint: Secondary | ICD-10-CM | POA: Diagnosis not present

## 2015-09-28 DIAGNOSIS — Z471 Aftercare following joint replacement surgery: Secondary | ICD-10-CM | POA: Diagnosis not present

## 2015-09-28 DIAGNOSIS — E119 Type 2 diabetes mellitus without complications: Secondary | ICD-10-CM | POA: Diagnosis not present

## 2015-09-29 DIAGNOSIS — I10 Essential (primary) hypertension: Secondary | ICD-10-CM | POA: Diagnosis not present

## 2015-09-29 DIAGNOSIS — E119 Type 2 diabetes mellitus without complications: Secondary | ICD-10-CM | POA: Diagnosis not present

## 2015-09-29 DIAGNOSIS — Z471 Aftercare following joint replacement surgery: Secondary | ICD-10-CM | POA: Diagnosis not present

## 2015-09-29 DIAGNOSIS — Z96651 Presence of right artificial knee joint: Secondary | ICD-10-CM | POA: Diagnosis not present

## 2015-09-29 DIAGNOSIS — M109 Gout, unspecified: Secondary | ICD-10-CM | POA: Diagnosis not present

## 2015-09-30 ENCOUNTER — Other Ambulatory Visit: Payer: Self-pay | Admitting: Internal Medicine

## 2015-09-30 NOTE — Telephone Encounter (Signed)
Pharmacy is calling in regards.

## 2015-10-01 DIAGNOSIS — E119 Type 2 diabetes mellitus without complications: Secondary | ICD-10-CM | POA: Diagnosis not present

## 2015-10-01 DIAGNOSIS — Z471 Aftercare following joint replacement surgery: Secondary | ICD-10-CM | POA: Diagnosis not present

## 2015-10-01 DIAGNOSIS — I10 Essential (primary) hypertension: Secondary | ICD-10-CM | POA: Diagnosis not present

## 2015-10-01 DIAGNOSIS — M109 Gout, unspecified: Secondary | ICD-10-CM | POA: Diagnosis not present

## 2015-10-01 DIAGNOSIS — Z96651 Presence of right artificial knee joint: Secondary | ICD-10-CM | POA: Diagnosis not present

## 2015-10-08 DIAGNOSIS — M25661 Stiffness of right knee, not elsewhere classified: Secondary | ICD-10-CM | POA: Diagnosis not present

## 2015-10-08 DIAGNOSIS — M25561 Pain in right knee: Secondary | ICD-10-CM | POA: Diagnosis not present

## 2015-10-08 DIAGNOSIS — M6281 Muscle weakness (generalized): Secondary | ICD-10-CM | POA: Diagnosis not present

## 2015-10-08 DIAGNOSIS — R262 Difficulty in walking, not elsewhere classified: Secondary | ICD-10-CM | POA: Diagnosis not present

## 2015-10-09 DIAGNOSIS — M6281 Muscle weakness (generalized): Secondary | ICD-10-CM | POA: Diagnosis not present

## 2015-10-09 DIAGNOSIS — M25561 Pain in right knee: Secondary | ICD-10-CM | POA: Diagnosis not present

## 2015-10-09 DIAGNOSIS — M25661 Stiffness of right knee, not elsewhere classified: Secondary | ICD-10-CM | POA: Diagnosis not present

## 2015-10-09 DIAGNOSIS — R262 Difficulty in walking, not elsewhere classified: Secondary | ICD-10-CM | POA: Diagnosis not present

## 2015-10-14 DIAGNOSIS — R262 Difficulty in walking, not elsewhere classified: Secondary | ICD-10-CM | POA: Diagnosis not present

## 2015-10-14 DIAGNOSIS — M6281 Muscle weakness (generalized): Secondary | ICD-10-CM | POA: Diagnosis not present

## 2015-10-14 DIAGNOSIS — M25561 Pain in right knee: Secondary | ICD-10-CM | POA: Diagnosis not present

## 2015-10-14 DIAGNOSIS — M25661 Stiffness of right knee, not elsewhere classified: Secondary | ICD-10-CM | POA: Diagnosis not present

## 2015-10-16 DIAGNOSIS — R262 Difficulty in walking, not elsewhere classified: Secondary | ICD-10-CM | POA: Diagnosis not present

## 2015-10-16 DIAGNOSIS — M25661 Stiffness of right knee, not elsewhere classified: Secondary | ICD-10-CM | POA: Diagnosis not present

## 2015-10-16 DIAGNOSIS — M25561 Pain in right knee: Secondary | ICD-10-CM | POA: Diagnosis not present

## 2015-10-16 DIAGNOSIS — M6281 Muscle weakness (generalized): Secondary | ICD-10-CM | POA: Diagnosis not present

## 2015-10-21 DIAGNOSIS — R262 Difficulty in walking, not elsewhere classified: Secondary | ICD-10-CM | POA: Diagnosis not present

## 2015-10-21 DIAGNOSIS — M6281 Muscle weakness (generalized): Secondary | ICD-10-CM | POA: Diagnosis not present

## 2015-10-21 DIAGNOSIS — M25561 Pain in right knee: Secondary | ICD-10-CM | POA: Diagnosis not present

## 2015-10-21 DIAGNOSIS — M25661 Stiffness of right knee, not elsewhere classified: Secondary | ICD-10-CM | POA: Diagnosis not present

## 2015-10-23 DIAGNOSIS — M25561 Pain in right knee: Secondary | ICD-10-CM | POA: Diagnosis not present

## 2015-10-23 DIAGNOSIS — M25661 Stiffness of right knee, not elsewhere classified: Secondary | ICD-10-CM | POA: Diagnosis not present

## 2015-10-23 DIAGNOSIS — R262 Difficulty in walking, not elsewhere classified: Secondary | ICD-10-CM | POA: Diagnosis not present

## 2015-10-23 DIAGNOSIS — M6281 Muscle weakness (generalized): Secondary | ICD-10-CM | POA: Diagnosis not present

## 2015-10-28 DIAGNOSIS — M25561 Pain in right knee: Secondary | ICD-10-CM | POA: Diagnosis not present

## 2015-10-28 DIAGNOSIS — R262 Difficulty in walking, not elsewhere classified: Secondary | ICD-10-CM | POA: Diagnosis not present

## 2015-10-28 DIAGNOSIS — M25661 Stiffness of right knee, not elsewhere classified: Secondary | ICD-10-CM | POA: Diagnosis not present

## 2015-10-28 DIAGNOSIS — M6281 Muscle weakness (generalized): Secondary | ICD-10-CM | POA: Diagnosis not present

## 2015-11-03 ENCOUNTER — Other Ambulatory Visit: Payer: Self-pay | Admitting: Internal Medicine

## 2015-11-18 ENCOUNTER — Other Ambulatory Visit: Payer: Self-pay

## 2015-11-18 MED ORDER — TESTOSTERONE CYPIONATE 200 MG/ML IM SOLN: INTRAMUSCULAR | Status: DC

## 2015-11-18 NOTE — Telephone Encounter (Signed)
Rx faxed to pharmacy  

## 2015-11-18 NOTE — Telephone Encounter (Signed)
Done hardcopy to Dahlia  

## 2015-11-19 ENCOUNTER — Other Ambulatory Visit: Payer: Self-pay | Admitting: Internal Medicine

## 2015-12-10 DIAGNOSIS — C44311 Basal cell carcinoma of skin of nose: Secondary | ICD-10-CM | POA: Diagnosis not present

## 2015-12-13 DIAGNOSIS — K269 Duodenal ulcer, unspecified as acute or chronic, without hemorrhage or perforation: Secondary | ICD-10-CM

## 2015-12-13 DIAGNOSIS — K2210 Ulcer of esophagus without bleeding: Secondary | ICD-10-CM

## 2015-12-13 HISTORY — DX: Ulcer of esophagus without bleeding: K22.10

## 2015-12-13 HISTORY — DX: Duodenal ulcer, unspecified as acute or chronic, without hemorrhage or perforation: K26.9

## 2015-12-17 ENCOUNTER — Encounter: Payer: Self-pay | Admitting: Internal Medicine

## 2016-02-04 DIAGNOSIS — L570 Actinic keratosis: Secondary | ICD-10-CM | POA: Diagnosis not present

## 2016-02-04 DIAGNOSIS — C4442 Squamous cell carcinoma of skin of scalp and neck: Secondary | ICD-10-CM | POA: Diagnosis not present

## 2016-02-08 ENCOUNTER — Ambulatory Visit (INDEPENDENT_AMBULATORY_CARE_PROVIDER_SITE_OTHER): Payer: Medicare Other | Admitting: Adult Health

## 2016-02-08 ENCOUNTER — Encounter: Payer: Self-pay | Admitting: Adult Health

## 2016-02-08 VITALS — BP 150/90 | HR 110 | Temp 97.5°F | Wt 223.9 lb

## 2016-02-08 DIAGNOSIS — R110 Nausea: Secondary | ICD-10-CM | POA: Diagnosis not present

## 2016-02-08 DIAGNOSIS — R5383 Other fatigue: Secondary | ICD-10-CM | POA: Diagnosis not present

## 2016-02-08 DIAGNOSIS — R112 Nausea with vomiting, unspecified: Secondary | ICD-10-CM

## 2016-02-08 MED ORDER — ONDANSETRON HCL 4 MG PO TABS: 4.0000 mg | ORAL_TABLET | Freq: Three times a day (TID) | ORAL | Status: DC | PRN

## 2016-02-08 NOTE — Progress Notes (Signed)
Pre visit review using our clinic review tool, if applicable. No additional management support is needed unless otherwise documented below in the visit note. 

## 2016-02-08 NOTE — Patient Instructions (Addendum)
It was great meeting you today!  Your flu swab came back negative.   Take 1/2 tablet of Imodium every 12 hours until you have a normal bowel movement.   I have sent in a prescription for zofran - use this for nausea.   Follow up if no improvement.     Diarrhea Diarrhea is frequent loose and watery bowel movements. It can cause you to feel weak and dehydrated. Dehydration can cause you to become tired and thirsty, have a dry mouth, and have decreased urination that often is dark yellow. Diarrhea is a sign of another problem, most often an infection that will not last long. In most cases, diarrhea typically lasts 2-3 days. However, it can last longer if it is a sign of something more serious. It is important to treat your diarrhea as directed by your caregiver to lessen or prevent future episodes of diarrhea. CAUSES  Some common causes include:  Gastrointestinal infections caused by viruses, bacteria, or parasites.  Food poisoning or food allergies.  Certain medicines, such as antibiotics, chemotherapy, and laxatives.  Artificial sweeteners and fructose.  Digestive disorders. HOME CARE INSTRUCTIONS  Ensure adequate fluid intake (hydration): Have 1 cup (8 oz) of fluid for each diarrhea episode. Avoid fluids that contain simple sugars or sports drinks, fruit juices, whole milk products, and sodas. Your urine should be clear or pale yellow if you are drinking enough fluids. Hydrate with an oral rehydration solution that you can purchase at pharmacies, retail stores, and online. You can prepare an oral rehydration solution at home by mixing the following ingredients together:   - tsp table salt.   tsp baking soda.   tsp salt substitute containing potassium chloride.  1  tablespoons sugar.  1 L (34 oz) of water.  Certain foods and beverages may increase the speed at which food moves through the gastrointestinal (GI) tract. These foods and beverages should be avoided and  include:  Caffeinated and alcoholic beverages.  High-fiber foods, such as raw fruits and vegetables, nuts, seeds, and whole grain breads and cereals.  Foods and beverages sweetened with sugar alcohols, such as xylitol, sorbitol, and mannitol.  Some foods may be well tolerated and may help thicken stool including:  Starchy foods, such as rice, toast, pasta, low-sugar cereal, oatmeal, grits, baked potatoes, crackers, and bagels.  Bananas.  Applesauce.  Add probiotic-rich foods to help increase healthy bacteria in the GI tract, such as yogurt and fermented milk products.  Wash your hands well after each diarrhea episode.  Only take over-the-counter or prescription medicines as directed by your caregiver.  Take a warm bath to relieve any burning or pain from frequent diarrhea episodes. SEEK IMMEDIATE MEDICAL CARE IF:   You are unable to keep fluids down.  You have persistent vomiting.  You have blood in your stool, or your stools are black and tarry.  You do not urinate in 6-8 hours, or there is only a small amount of very dark urine.  You have abdominal pain that increases or localizes.  You have weakness, dizziness, confusion, or light-headedness.  You have a severe headache.  Your diarrhea gets worse or does not get better.  You have a fever or persistent symptoms for more than 2-3 days.  You have a fever and your symptoms suddenly get worse. MAKE SURE YOU:   Understand these instructions.  Will watch your condition.  Will get help right away if you are not doing well or get worse.   This information is  not intended to replace advice given to you by your health care provider. Make sure you discuss any questions you have with your health care provider.   Document Released: 11/18/2002 Document Revised: 12/19/2014 Document Reviewed: 08/05/2012 Elsevier Interactive Patient Education [employer].
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
## 2016-02-09 ENCOUNTER — Other Ambulatory Visit: Payer: Self-pay | Admitting: *Deleted

## 2016-02-09 MED ORDER — FLUTICASONE PROPIONATE 50 MCG/ACT NA SUSP: 2.0000 | Freq: Every day | NASAL | Status: DC | PRN

## 2016-02-09 NOTE — Telephone Encounter (Signed)
Pharmacy left msg on triage pt requesting refill on his fluticasone rx has expired. Sent electronically...[name]

## 2016-02-15 ENCOUNTER — Other Ambulatory Visit: Payer: Self-pay | Admitting: Internal Medicine

## 2016-02-15 NOTE — Telephone Encounter (Signed)
Received call pt wife states pharmacy states they have been trying to get his Furosemide fill since last week. Inform wife per chart we only received refill on his generic flonase, but can send refill to [facility]. Sent electronically...Johny Chess

## 2016-03-15 ENCOUNTER — Other Ambulatory Visit: Payer: Self-pay | Admitting: Internal Medicine

## 2016-03-17 ENCOUNTER — Ambulatory Visit (INDEPENDENT_AMBULATORY_CARE_PROVIDER_SITE_OTHER): Payer: Medicare Other | Admitting: Internal Medicine

## 2016-03-17 ENCOUNTER — Encounter: Payer: Self-pay | Admitting: Internal Medicine

## 2016-03-17 ENCOUNTER — Other Ambulatory Visit (INDEPENDENT_AMBULATORY_CARE_PROVIDER_SITE_OTHER): Payer: PRIVATE HEALTH INSURANCE

## 2016-03-17 VITALS — BP 140/84 | HR 85 | Temp 98.6°F | Resp 20 | Wt 230.0 lb

## 2016-03-17 DIAGNOSIS — I10 Essential (primary) hypertension: Secondary | ICD-10-CM

## 2016-03-17 DIAGNOSIS — R6889 Other general symptoms and signs: Secondary | ICD-10-CM

## 2016-03-17 DIAGNOSIS — E785 Hyperlipidemia, unspecified: Secondary | ICD-10-CM

## 2016-03-17 DIAGNOSIS — N182 Chronic kidney disease, stage 2 (mild): Secondary | ICD-10-CM

## 2016-03-17 DIAGNOSIS — E119 Type 2 diabetes mellitus without complications: Secondary | ICD-10-CM

## 2016-03-17 DIAGNOSIS — Z23 Encounter for immunization: Secondary | ICD-10-CM

## 2016-03-17 DIAGNOSIS — Z0000 Encounter for general adult medical examination without abnormal findings: Secondary | ICD-10-CM | POA: Diagnosis not present

## 2016-03-17 DIAGNOSIS — Z0001 Encounter for general adult medical examination with abnormal findings: Secondary | ICD-10-CM

## 2016-03-17 LAB — HEPATIC FUNCTION PANEL
ALBUMIN: 4.5 g/dL (ref 3.5–5.2)
ALT: 14 U/L (ref 0–53)
AST: 17 U/L (ref 0–37)
Alkaline Phosphatase: 122 U/L — ABNORMAL HIGH (ref 39–117)
Bilirubin, Direct: 0.2 mg/dL (ref 0.0–0.3)
Total Bilirubin: 0.9 mg/dL (ref 0.2–1.2)
Total Protein: 7.3 g/dL (ref 6.0–8.3)

## 2016-03-17 LAB — URINALYSIS, ROUTINE W REFLEX MICROSCOPIC
BILIRUBIN URINE: NEGATIVE
HGB URINE DIPSTICK: NEGATIVE
Ketones, ur: NEGATIVE
LEUKOCYTES UA: NEGATIVE
NITRITE: NEGATIVE
RBC / HPF: NONE SEEN (ref 0–?)
Specific Gravity, Urine: <=1.005 — AB (ref 1.000–1.030)
Total Protein, Urine: NEGATIVE
URINE GLUCOSE: NEGATIVE
Urobilinogen, UA: 0.2 (ref 0.0–1.0)
WBC, UA: NONE SEEN (ref 0–?)
pH: 5 (ref 5.0–8.0)

## 2016-03-17 LAB — LIPID PANEL
CHOL/HDL RATIO: 3
CHOLESTEROL: 146 mg/dL (ref 0–200)
HDL: 46.6 mg/dL (ref >39.00)
LDL CALC: 72 mg/dL (ref 0–99)
NonHDL: 99.19
TRIGLYCERIDES: 138 mg/dL (ref 0.0–149.0)
VLDL: 27.6 mg/dL (ref 0.0–40.0)

## 2016-03-17 LAB — BASIC METABOLIC PANEL
BUN: 28 mg/dL — ABNORMAL HIGH (ref 6–23)
CHLORIDE: 101 meq/L (ref 96–112)
CO2: 30 mEq/L (ref 19–32)
Calcium: 9.8 mg/dL (ref 8.4–10.5)
Creatinine, Ser: 1.35 mg/dL (ref 0.40–1.50)
GFR: 54.35 mL/min — AB (ref >60.00)
Glucose, Bld: 114 mg/dL — ABNORMAL HIGH (ref 70–99)
POTASSIUM: 4.5 meq/L (ref 3.5–5.1)
SODIUM: 139 meq/L (ref 135–145)

## 2016-03-17 LAB — CBC WITH DIFFERENTIAL/PLATELET
Basophils Absolute: 0.1 10*3/uL (ref 0.0–0.1)
Basophils Relative: 0.6 % (ref 0.0–3.0)
EOS PCT: 6.5 % — AB (ref 0.0–5.0)
Eosinophils Absolute: 0.6 10*3/uL (ref 0.0–0.7)
HCT: 41.4 % (ref 39.0–52.0)
HEMOGLOBIN: 13.6 g/dL (ref 13.0–17.0)
Lymphocytes Relative: 16.9 % (ref 12.0–46.0)
Lymphs Abs: 1.7 10*3/uL (ref 0.7–4.0)
MCHC: 32.7 g/dL (ref 30.0–36.0)
MCV: 84.7 fl (ref 78.0–100.0)
MONO ABS: 1 10*3/uL (ref 0.1–1.0)
MONOS PCT: 9.8 % (ref 3.0–12.0)
Neutro Abs: 6.6 10*3/uL (ref 1.4–7.7)
Neutrophils Relative %: 66.2 % (ref 43.0–77.0)
Platelets: 255 10*3/uL (ref 150.0–400.0)
RBC: 4.89 Mil/uL (ref 4.22–5.81)
RDW: 18.9 % — ABNORMAL HIGH (ref 11.5–15.5)
WBC: 9.9 10*3/uL (ref 4.0–10.5)

## 2016-03-17 LAB — MICROALBUMIN / CREATININE URINE RATIO
CREATININE, U: 76.6 mg/dL
MICROALB UR: 4.1 mg/dL — AB (ref 0.0–1.9)
MICROALB/CREAT RATIO: 5.4 mg/g (ref 0.0–30.0)

## 2016-03-17 LAB — PSA: PSA: 1.32 ng/mL (ref 0.10–4.00)

## 2016-03-17 LAB — HEMOGLOBIN A1C: Hgb A1c MFr Bld: 6.8 % — ABNORMAL HIGH (ref 4.6–6.5)

## 2016-03-17 LAB — TSH: TSH: 1.4 u[IU]/mL (ref 0.35–4.50)
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
## 2016-03-17 NOTE — Assessment & Plan Note (Signed)

## 2016-03-17 NOTE — Progress Notes (Signed)
Pre visit review using our clinic review tool, if applicable. No additional management support is needed unless otherwise documented below in the visit note. 

## 2016-03-17 NOTE — Assessment & Plan Note (Signed)
stable overall by history and exam, recent data reviewed with pt, and pt to continue medical treatment as before,  to f/u any worsening symptoms or concerns Lab Results  Component Value Date   CREATININE 1.71* 09/10/2015   For f/u today, consider nephrology referral

## 2016-03-17 NOTE — Assessment & Plan Note (Signed)
stable overall by history and exam, recent data reviewed with pt, and pt to continue medical treatment as before,  to f/u any worsening symptoms or concerns BP Readings from Last 3 Encounters:  03/17/16 140/84  02/08/16 150/90  09/10/15 101/71

## 2016-03-17 NOTE — Patient Instructions (Addendum)
You had the Tdap shot today  Please continue all other medications as before, and refills have been done if requested.  Please have the pharmacy call with any other refills you may need.  Please continue your efforts at being more active, low cholesterol diet, and weight control.  You are otherwise up to date with prevention measures today.  Please keep your appointments with your specialists as you may have planned  Please go to the LAB in the Basement (turn left off the elevator) for the tests to be done today  You will be contacted by phone if any changes need to be made immediately.  Otherwise, you will receive a letter about your results with an explanation, but please check with MyChart first.  Please remember to sign up for MyChart if you have not done so, as this will be important to you in the future with finding out test results, communicating by private email, and scheduling acute appointments online when needed.  Please return in 6 months, or sooner if needed, with Lab testing done 3-5 days before

## 2016-03-17 NOTE — Assessment & Plan Note (Signed)
stable overall by history and exam, recent data reviewed with pt, and pt to continue medical treatment as before,  to f/u any worsening symptoms or concerns Lab Results  Component Value Date   HGBA1C 7.8* 09/08/2015   For f/u a1c, goal < 7-7.5

## 2016-03-17 NOTE — Assessment & Plan Note (Signed)
stable overall by history and exam, recent data reviewed with pt, and pt to continue medical treatment as before,  to f/u any worsening symptoms or concerns Lab Results  Component Value Date   LDLCALC 63 06/10/2015    

## 2016-04-29 DIAGNOSIS — M1711 Unilateral primary osteoarthritis, right knee: Secondary | ICD-10-CM | POA: Diagnosis not present

## 2016-05-10 DIAGNOSIS — M10021 Idiopathic gout, right elbow: Secondary | ICD-10-CM | POA: Diagnosis not present

## 2016-05-10 DIAGNOSIS — M10041 Idiopathic gout, right hand: Secondary | ICD-10-CM | POA: Diagnosis not present

## 2016-05-10 DIAGNOSIS — M10022 Idiopathic gout, left elbow: Secondary | ICD-10-CM | POA: Diagnosis not present

## 2016-05-26 ENCOUNTER — Other Ambulatory Visit: Payer: Self-pay | Admitting: Internal Medicine

## 2016-07-11 ENCOUNTER — Encounter (HOSPITAL_COMMUNITY): Payer: Self-pay | Admitting: Emergency Medicine

## 2016-07-11 ENCOUNTER — Telehealth: Payer: Self-pay | Admitting: Internal Medicine

## 2016-07-11 ENCOUNTER — Emergency Department (HOSPITAL_COMMUNITY): Payer: Medicare Other

## 2016-07-11 ENCOUNTER — Emergency Department (HOSPITAL_COMMUNITY)
Admission: EM | Admit: 2016-07-11 | Discharge: 2016-07-11 | Disposition: A | Discharge status: Home / Self Care | Payer: Medicare Other | Attending: Emergency Medicine | Admitting: Emergency Medicine

## 2016-07-11 DIAGNOSIS — Z7984 Long term (current) use of oral hypoglycemic drugs: Secondary | ICD-10-CM | POA: Insufficient documentation

## 2016-07-11 DIAGNOSIS — Z7982 Long term (current) use of aspirin: Secondary | ICD-10-CM | POA: Insufficient documentation

## 2016-07-11 DIAGNOSIS — E119 Type 2 diabetes mellitus without complications: Secondary | ICD-10-CM | POA: Diagnosis not present

## 2016-07-11 DIAGNOSIS — E875 Hyperkalemia: Secondary | ICD-10-CM | POA: Insufficient documentation

## 2016-07-11 DIAGNOSIS — D72829 Elevated white blood cell count, unspecified: Secondary | ICD-10-CM | POA: Diagnosis not present

## 2016-07-11 DIAGNOSIS — R1032 Left lower quadrant pain: Secondary | ICD-10-CM | POA: Diagnosis not present

## 2016-07-11 DIAGNOSIS — Z96651 Presence of right artificial knee joint: Secondary | ICD-10-CM | POA: Diagnosis not present

## 2016-07-11 DIAGNOSIS — K922 Gastrointestinal hemorrhage, unspecified: Secondary | ICD-10-CM | POA: Insufficient documentation

## 2016-07-11 DIAGNOSIS — I129 Hypertensive chronic kidney disease with stage 1 through stage 4 chronic kidney disease, or unspecified chronic kidney disease: Secondary | ICD-10-CM | POA: Diagnosis not present

## 2016-07-11 DIAGNOSIS — K625 Hemorrhage of anus and rectum: Secondary | ICD-10-CM

## 2016-07-11 DIAGNOSIS — Z87891 Personal history of nicotine dependence: Secondary | ICD-10-CM | POA: Insufficient documentation

## 2016-07-11 DIAGNOSIS — D649 Anemia, unspecified: Secondary | ICD-10-CM

## 2016-07-11 DIAGNOSIS — N189 Chronic kidney disease, unspecified: Secondary | ICD-10-CM | POA: Insufficient documentation

## 2016-07-11 DIAGNOSIS — Z85828 Personal history of other malignant neoplasm of skin: Secondary | ICD-10-CM | POA: Diagnosis not present

## 2016-07-11 DIAGNOSIS — N182 Chronic kidney disease, stage 2 (mild): Secondary | ICD-10-CM | POA: Diagnosis not present

## 2016-07-11 LAB — CBC
HEMATOCRIT: 42.2 % (ref 39.0–52.0)
Hemoglobin: 12.9 g/dL — ABNORMAL LOW (ref 13.0–17.0)
MCH: 28.3 pg (ref 26.0–34.0)
MCHC: 30.6 g/dL (ref 30.0–36.0)
MCV: 92.5 fL (ref 78.0–100.0)
Platelets: 250 10*3/uL (ref 150–400)
RBC: 4.56 MIL/uL (ref 4.22–5.81)
RDW: 14.9 % (ref 11.5–15.5)
WBC: 12.1 10*3/uL — AB (ref 4.0–10.5)

## 2016-07-11 LAB — COMPREHENSIVE METABOLIC PANEL
ALBUMIN: 3.6 g/dL (ref 3.5–5.0)
ALT: 22 U/L (ref 17–63)
AST: 24 U/L (ref 15–41)
Alkaline Phosphatase: 96 U/L (ref 38–126)
Anion gap: 7 (ref 5–15)
BUN: 48 mg/dL — AB (ref 6–20)
CHLORIDE: 102 mmol/L (ref 101–111)
CO2: 28 mmol/L (ref 22–32)
Calcium: 9.3 mg/dL (ref 8.9–10.3)
Creatinine, Ser: 1.46 mg/dL — ABNORMAL HIGH (ref 0.61–1.24)
GFR calc Af Amer: 51 mL/min — ABNORMAL LOW (ref >60)
GFR calc non Af Amer: 44 mL/min — ABNORMAL LOW (ref >60)
GLUCOSE: 183 mg/dL — AB (ref 65–99)
POTASSIUM: 5.4 mmol/L — AB (ref 3.5–5.1)
SODIUM: 137 mmol/L (ref 135–145)
Total Bilirubin: 0.7 mg/dL (ref 0.3–1.2)
Total Protein: 6.5 g/dL (ref 6.5–8.1)

## 2016-07-11 LAB — POTASSIUM: Potassium: 5.3 mmol/L — ABNORMAL HIGH (ref 3.5–5.1)

## 2016-07-11 LAB — TYPE AND SCREEN
ABO/RH(D): O NEG
Antibody Screen: NEGATIVE

## 2016-07-11 LAB — POC OCCULT BLOOD, ED: Fecal Occult Bld: POSITIVE — AB

## 2016-07-11 MED ORDER — SODIUM CHLORIDE 0.9 % IV BOLUS (SEPSIS)
500.0000 mL | Freq: Once | INTRAVENOUS | Status: AC
  Administered 2016-07-11: 500 mL via INTRAVENOUS

## 2016-07-11 MED ORDER — SUCRALFATE 1 G PO TABS: 1.0000 g | ORAL_TABLET | Freq: Three times a day (TID) | ORAL | 0 refills | Status: DC

## 2016-07-11 MED ORDER — SUCRALFATE 1 GM/10ML PO SUSP: 1.0000 g | Freq: Three times a day (TID) | ORAL | Status: DC

## 2016-07-11 MED ORDER — SENNA 8.6 MG PO TABS: 1.0000 | ORAL_TABLET | Freq: Every day | ORAL | 0 refills | Status: DC

## 2016-07-11 MED ORDER — SENNA 8.6 MG PO TABS: 1.0000 | ORAL_TABLET | Freq: Every day | ORAL | Status: DC

## 2016-07-11 MED ORDER — IOPAMIDOL (ISOVUE-300) INJECTION 61%
80.0000 mL | Freq: Once | INTRAVENOUS | Status: AC | PRN
  Administered 2016-07-11: 80 mL via INTRAVENOUS

## 2016-07-11 NOTE — Telephone Encounter (Signed)
Patient's wife called to advise that he was in the ED over the weekend. They told him that he need to get hemoglobin checked this week. Please advise.
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
## 2016-07-11 NOTE — ED Triage Notes (Signed)
Pt. reports rectal bleeding and bloody stools this morning , denies abdominal pain , no fever or chills.

## 2016-07-11 NOTE — ED Notes (Signed)
Pt waiting for house keeping to come a mop floor due to a liquid substance on the floor

## 2016-07-11 NOTE — Telephone Encounter (Signed)
Cbc has been ordered

## 2016-07-11 NOTE — ED Provider Notes (Signed)
Patient with 3 episodes of rectal bleeding onset this morning. He is asymptomatic presently. He denies pain anywhere. Patient is alert and in no distress   Orlie Dakin, MD 07/11/16 1049

## 2016-07-11 NOTE — Discharge Instructions (Signed)
Read the information below.  You may return to the Emergency Department at any time for worsening condition or any new symptoms that concern you.   If you develop high fevers, increased or uncontrolled bleeding, abdominal pain, uncontrolled vomiting, or are unable to tolerate fluids by mouth, return to the ER for a recheck.
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
## 2016-07-12 ENCOUNTER — Other Ambulatory Visit (INDEPENDENT_AMBULATORY_CARE_PROVIDER_SITE_OTHER): Payer: PRIVATE HEALTH INSURANCE

## 2016-07-12 DIAGNOSIS — D649 Anemia, unspecified: Secondary | ICD-10-CM | POA: Diagnosis not present

## 2016-07-12 LAB — CBC WITH DIFFERENTIAL/PLATELET
Basophils Absolute: 0.1 K/uL (ref 0.0–0.1)
Basophils Relative: 0.5 % (ref 0.0–3.0)
Eosinophils Absolute: 0.6 K/uL (ref 0.0–0.7)
Eosinophils Relative: 5.5 % — ABNORMAL HIGH (ref 0.0–5.0)
HCT: 30.4 % — ABNORMAL LOW (ref 39.0–52.0)
Hemoglobin: 10.2 g/dL — ABNORMAL LOW (ref 13.0–17.0)
Lymphocytes Relative: 18.4 % (ref 12.0–46.0)
Lymphs Abs: 2 K/uL (ref 0.7–4.0)
MCHC: 33.5 g/dL (ref 30.0–36.0)
MCV: 85.6 fl (ref 78.0–100.0)
Monocytes Absolute: 0.7 K/uL (ref 0.1–1.0)
Monocytes Relative: 6.8 % (ref 3.0–12.0)
Neutro Abs: 7.3 K/uL (ref 1.4–7.7)
Neutrophils Relative %: 68.8 % (ref 43.0–77.0)
Platelets: 257 K/uL (ref 150.0–400.0)
RBC: 3.55 Mil/uL — ABNORMAL LOW (ref 4.22–5.81)
RDW: 16.3 % — ABNORMAL HIGH (ref 11.5–15.5)
WBC: 10.7 K/uL — ABNORMAL HIGH (ref 4.0–10.5)

## 2016-07-12 NOTE — Telephone Encounter (Signed)
Patient aware.

## 2016-07-18 ENCOUNTER — Encounter (HOSPITAL_COMMUNITY): Payer: Self-pay | Admitting: Emergency Medicine

## 2016-07-18 ENCOUNTER — Emergency Department (HOSPITAL_COMMUNITY)
Admission: EM | Admit: 2016-07-18 | Discharge: 2016-07-18 | Disposition: A | Discharge status: Home / Self Care | Payer: Medicare Other | Attending: Emergency Medicine | Admitting: Emergency Medicine

## 2016-07-18 DIAGNOSIS — Z79899 Other long term (current) drug therapy: Secondary | ICD-10-CM | POA: Diagnosis not present

## 2016-07-18 DIAGNOSIS — N189 Chronic kidney disease, unspecified: Secondary | ICD-10-CM | POA: Insufficient documentation

## 2016-07-18 DIAGNOSIS — Z7982 Long term (current) use of aspirin: Secondary | ICD-10-CM | POA: Insufficient documentation

## 2016-07-18 DIAGNOSIS — R1084 Generalized abdominal pain: Secondary | ICD-10-CM

## 2016-07-18 DIAGNOSIS — E1122 Type 2 diabetes mellitus with diabetic chronic kidney disease: Secondary | ICD-10-CM | POA: Insufficient documentation

## 2016-07-18 DIAGNOSIS — Z87891 Personal history of nicotine dependence: Secondary | ICD-10-CM | POA: Diagnosis not present

## 2016-07-18 DIAGNOSIS — Z7984 Long term (current) use of oral hypoglycemic drugs: Secondary | ICD-10-CM | POA: Insufficient documentation

## 2016-07-18 DIAGNOSIS — K625 Hemorrhage of anus and rectum: Secondary | ICD-10-CM | POA: Insufficient documentation

## 2016-07-18 DIAGNOSIS — I129 Hypertensive chronic kidney disease with stage 1 through stage 4 chronic kidney disease, or unspecified chronic kidney disease: Secondary | ICD-10-CM | POA: Insufficient documentation

## 2016-07-18 LAB — COMPREHENSIVE METABOLIC PANEL
ALK PHOS: 102 U/L (ref 38–126)
ALT: 18 U/L (ref 17–63)
AST: 19 U/L (ref 15–41)
Albumin: 3.9 g/dL (ref 3.5–5.0)
Anion gap: 5 (ref 5–15)
BUN: 17 mg/dL (ref 6–20)
CALCIUM: 8.6 mg/dL — AB (ref 8.9–10.3)
CO2: 27 mmol/L (ref 22–32)
CREATININE: 1.24 mg/dL (ref 0.61–1.24)
Chloride: 105 mmol/L (ref 101–111)
GFR, EST NON AFRICAN AMERICAN: 54 mL/min — AB (ref >60)
Glucose, Bld: 120 mg/dL — ABNORMAL HIGH (ref 65–99)
Potassium: 4.3 mmol/L (ref 3.5–5.1)
Sodium: 137 mmol/L (ref 135–145)
Total Bilirubin: 0.7 mg/dL (ref 0.3–1.2)
Total Protein: 6.9 g/dL (ref 6.5–8.1)

## 2016-07-18 LAB — TYPE AND SCREEN
ABO/RH(D): O NEG
Antibody Screen: NEGATIVE

## 2016-07-18 LAB — CBC
HCT: 35 % — ABNORMAL LOW (ref 39.0–52.0)
Hemoglobin: 11.3 g/dL — ABNORMAL LOW (ref 13.0–17.0)
MCH: 29.4 pg (ref 26.0–34.0)
MCHC: 32.3 g/dL (ref 30.0–36.0)
MCV: 90.9 fL (ref 78.0–100.0)
PLATELETS: 274 10*3/uL (ref 150–400)
RBC: 3.85 MIL/uL — AB (ref 4.22–5.81)
RDW: 15.7 % — AB (ref 11.5–15.5)
WBC: 11.6 10*3/uL — AB (ref 4.0–10.5)

## 2016-07-18 LAB — POC OCCULT BLOOD, ED: Fecal Occult Bld: NEGATIVE

## 2016-07-18 MED ORDER — SODIUM CHLORIDE 0.9 % IV BOLUS (SEPSIS)
1000.0000 mL | Freq: Once | INTRAVENOUS | Status: AC
  Administered 2016-07-18: 1000 mL via INTRAVENOUS

## 2016-07-18 MED ORDER — FAMOTIDINE IN NACL 20-0.9 MG/50ML-% IV SOLN
20.0000 mg | Freq: Once | INTRAVENOUS | Status: AC
  Administered 2016-07-18: 20 mg via INTRAVENOUS
  Filled 2016-07-18: qty 50

## 2016-07-18 NOTE — ED Triage Notes (Signed)
Pt reports lower abd pain,weakness for last several days. Pt family reports pt was diagnosed with rectal bleeding on July 31st. Pt family also reports pt is currently taking pepto bismol and reports continued black "tarry" stools.
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
## 2016-07-21 ENCOUNTER — Encounter: Payer: Self-pay | Admitting: Gastroenterology

## 2016-07-21 ENCOUNTER — Ambulatory Visit (INDEPENDENT_AMBULATORY_CARE_PROVIDER_SITE_OTHER): Payer: Medicare Other | Admitting: Gastroenterology

## 2016-07-21 DIAGNOSIS — K625 Hemorrhage of anus and rectum: Secondary | ICD-10-CM | POA: Diagnosis not present

## 2016-07-21 DIAGNOSIS — D500 Iron deficiency anemia secondary to blood loss (chronic): Secondary | ICD-10-CM | POA: Insufficient documentation

## 2016-07-21 DIAGNOSIS — R1013 Epigastric pain: Secondary | ICD-10-CM | POA: Diagnosis not present

## 2016-07-21 NOTE — Assessment & Plan Note (Addendum)
80 y/o male with two week history intermittent history of rectal bleeding. Two ER visits. Some decline in Hgb with mild normocytic anemia at this point. Patient complains of fatigue. He has stopped his Aleve. Some vague epigastric pain. Last colonoscopy in 2013 as outlined above. Possible benign anorectal bleeding in the setting of constipation but given drop in Hgb, concerning of more significant GI bleeding. Given Aleve use and abdominal pain, cannot rule out ugi source contributing to his anemia.   If progressive weakness, recurrent significant bleeding or melena, he should go to the ER.   Recommend colonoscopy +/- EGD in near future with Dr. Gala Romney. Patient was difficult EGD with conscious sedation in the 1990s due to wretching. He has historically had deep sedation since that time. Plan for deep sedation in the OR.  I have discussed the risks, alternatives, benefits with regards to but not limited to the risk of reaction to medication, bleeding, infection, perforation and the patient is agreeable to proceed. Written consent to be obtained.
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
## 2016-07-21 NOTE — Patient Instructions (Signed)
1. Colonoscopy with possible upper endoscopy in the near future. Our scheduler will call you this afternoon with instructions.

## 2016-07-21 NOTE — Progress Notes (Signed)
cc'ed to pcp °

## 2016-07-22 ENCOUNTER — Other Ambulatory Visit: Payer: Self-pay

## 2016-07-22 DIAGNOSIS — K625 Hemorrhage of anus and rectum: Secondary | ICD-10-CM

## 2016-07-22 DIAGNOSIS — D649 Anemia, unspecified: Secondary | ICD-10-CM

## 2016-07-22 MED ORDER — PEG-KCL-NACL-NASULF-NA ASC-C 100 G PO SOLR: 1.0000 | ORAL | 0 refills | Status: DC

## 2016-07-26 NOTE — Patient Instructions (Signed)
[name]  07/26/2016     @PREFPERIOPPHARMACY @   Your procedure is scheduled on 08/01/2016.  Report to [facility] at 6:45 A.M.  Call this number if you have problems the morning of surgery:  [phone]   Remember:  Do not eat food or drink liquids after midnight.  Take these medicines the morning of surgery with A SIP OF WATER : NORVASC, ZANTAC AND FLONASE   Do not wear jewelry, make-up or nail polish.  Do not wear lotions, powders, or perfumes.  You may wear deoderant.  Do not shave 48 hours prior to surgery.  Men may shave face and neck.  Do not bring valuables to the hospital.  [facility] is not responsible for any belongings or valuables.  Contacts, dentures or bridgework may not be worn into surgery.  Leave your suitcase in the car.  After surgery it may be brought to your room.  For patients admitted to the hospital, discharge time will be determined by your treatment team.  Patients discharged the day of surgery will not be allowed to drive home.   Name and phone number of your driver:   FAMILY Special instructions:  N/A  Please read over the following fact sheets that you were given. Care and Recovery After Surgery    Esophagogastrectomy Esophagogastrectomy is a surgery to remove a diseased portion of your esophagus. The upper portion of your stomach and some lymph nodes in the area are removed at the same time. In most cases, your stomach is then attached directly to the remaining portion of the esophagus. Esophagogastrectomy is often done as a treatment for cancer or precancerous conditions. The surgery may also be done to treat severe injuries to the esophagus. LET YOUR CAREGIVER KNOW ABOUT:   Allergies to food or medicine.  Medicines taken, including vitamins, herbs, eyedrops, over-the-counter medicines, and creams.  Use of steroids (by mouth or creams).  Previous problems with anesthetics or numbing medicines.  History of bleeding problems or  blood clots.  Previous surgery.  Other health problems, including diabetes and kidney problems.  Possibility of pregnancy, if this applies. RISKS AND COMPLICATIONS   Allergies to medicines used during the procedure.  Problems with breathing.  Bleeding.  Infection.  Damage to other structures near the esophagus and stomach.  Problems with swallowing. BEFORE THE PROCEDURE   Stop smoking if you smoke. Stopping will improve your health after surgery.  You may have blood tests to make sure your blood clots normally. Ask your caregiver about changing or stopping your regular medicines. You may be asked to stop taking blood thinners (anticoagulants) or nonsteroidal anti-inflammatory drugs (NSAIDs).  Do not eat or drink anything at least 8 hours before the surgery.  You and your caregiver will talk about the different surgical approaches. Together, you will agree on the surgical approach that is right for you. PROCEDURE   An intravenous (IV) access tube is put into one of your veins to give you fluids and medicines.  You will receive medicines to relax you (sedatives) and medicines that make you sleep (general anesthetic).  You may have a flexible tube (catheter) put into your bladder to drain urine.  You may have a tube put through your nose or mouth into your stomach (NG tube) to remove digestive juices and prevent you from vomiting and feeling nauseous.  Surgical cuts (incisions) may be made in the throat, chest, or abdomen. Multiple smaller incisions may be made if the surgery can be done  using a scope.  The part of the stomach to be removed will be stapled off and taken out.  The diseased length of esophagus will be removed, as well as lymph nodes in the area.  The remaining portion of the stomach will be attached to the remaining portion of the esophagus.  All incisions will be closed with stitches (sutures), staples, or surgical glue. AFTER THE PROCEDURE   You will  wake up in a recovery room, resting in bed until you have fully returned to consciousness.  You will have some pain. Pain medicines will be available to help you.  Your temperature, breathing rate, heart rate, blood pressure, and oxygen level (vital signs) will be monitored regularly.  You may be admitted to an intensive care unit (ICU) in the hospital for 1-2 days. There, you can be closely monitored.  The head of your bed will be kept at an upright angle.  You will likely have an NG tube that goes through your nose and into your stomach.  You may have a feeding tube. This tube will give you the proper nutrition until you can take food by mouth again.  You will likely have multiple tubes that are draining fluid from the incision.  You will have a catheter in your bladder.  You will continue to receive IV fluids.  You may have compression stockings on your legs to prevent blood clots.  You will be taught some breathing exercises. These will help your lungs recover from the anesthesia.  When you are more stable, you will be transferred to a regular hospital room.  You will probably be in the hospital for about 10-14 days. During this time, various drains, tubes, and monitors will slowly be discontinued when they are no longer needed. You will also be slowly eased into doing more activity and drinking and eating by mouth again.   This information is not intended to replace advice given to you by your health care provider. Make sure you discuss any questions you have with your health care provider.   Document Released: 05/29/2012 Document Reviewed: 05/29/2012 Elsevier Interactive Patient Education [employer].  Colonoscopy A colonoscopy is an exam to look at the entire large intestine (colon). This exam can help find problems such as tumors, polyps, inflammation, and areas of bleeding. The exam takes about 1 hour.  LET [facility] CARE PROVIDER KNOW ABOUT:   Any allergies you  have.  All medicines you are taking, including vitamins, herbs, eye drops, creams, and over-the-counter medicines.  Previous problems you or members of your family have had with the use of anesthetics.  Any blood disorders you have.  Previous surgeries you have had.  Medical conditions you have. RISKS AND COMPLICATIONS  Generally, this is a safe procedure. However, as with any procedure, complications can occur. Possible complications include:  Bleeding.  Tearing or rupture of the colon wall.  Reaction to medicines given during the exam.  Infection (rare). BEFORE THE PROCEDURE   Ask your health care provider about changing or stopping your regular medicines.  You may be prescribed an oral bowel prep. This involves drinking a large amount of medicated liquid, starting the day before your procedure. The liquid will cause you to have multiple loose stools until your stool is almost clear or light green. This cleans out your colon in preparation for the procedure.  Do not eat or drink anything else once you have started the bowel prep, unless your health care provider tells you  it is safe to do so.  Arrange for someone to drive you home after the procedure. PROCEDURE   You will be given medicine to help you relax (sedative).  You will lie on your side with your knees bent.  A long, flexible tube with a light and camera on the end (colonoscope) will be inserted through the rectum and into the colon. The camera sends video back to a computer screen as it moves through the colon. The colonoscope also releases carbon dioxide gas to inflate the colon. This helps your health care provider see the area better.  During the exam, your health care provider may take a small tissue sample (biopsy) to be examined under a microscope if any abnormalities are found.  The exam is finished when the entire colon has been viewed. AFTER THE PROCEDURE   Do not drive for 24 hours after the  exam.  You may have a small amount of blood in your stool.  You may pass moderate amounts of gas and have mild abdominal cramping or bloating. This is caused by the gas used to inflate your colon during the exam.  Ask when your test results will be ready and how you will get your results. Make sure you get your test results.   This information is not intended to replace advice given to you by your health care provider. Make sure you discuss any questions you have with your health care provider.   Document Released: 11/25/2000 Document Revised: 09/18/2013 Document Reviewed: 08/05/2013 Elsevier Interactive Patient Education [employer].

## 2016-07-27 ENCOUNTER — Encounter (HOSPITAL_COMMUNITY)
Admission: RE | Admit: 2016-07-27 | Discharge: 2016-07-27 | Disposition: A | Discharge status: Home / Self Care | Payer: Medicare Other | Source: Ambulatory Visit | Attending: Internal Medicine | Admitting: Internal Medicine

## 2016-07-27 ENCOUNTER — Encounter (HOSPITAL_COMMUNITY): Payer: Self-pay

## 2016-07-27 DIAGNOSIS — Z01812 Encounter for preprocedural laboratory examination: Secondary | ICD-10-CM | POA: Diagnosis not present

## 2016-07-27 LAB — CBC
HCT: 32.6 % — ABNORMAL LOW (ref 39.0–52.0)
Hemoglobin: 10.4 g/dL — ABNORMAL LOW (ref 13.0–17.0)
MCH: 28.7 pg (ref 26.0–34.0)
MCHC: 31.9 g/dL (ref 30.0–36.0)
MCV: 89.8 fL (ref 78.0–100.0)
PLATELETS: 302 10*3/uL (ref 150–400)
RBC: 3.63 MIL/uL — AB (ref 4.22–5.81)
RDW: 15.3 % (ref 11.5–15.5)
WBC: 10.1 10*3/uL (ref 4.0–10.5)

## 2016-07-27 LAB — BASIC METABOLIC PANEL
Anion gap: 8 (ref 5–15)
BUN: 22 mg/dL — AB (ref 6–20)
CO2: 24 mmol/L (ref 22–32)
Calcium: 8.7 mg/dL — ABNORMAL LOW (ref 8.9–10.3)
Chloride: 106 mmol/L (ref 101–111)
Creatinine, Ser: 1.71 mg/dL — ABNORMAL HIGH (ref 0.61–1.24)
GFR calc Af Amer: 42 mL/min — ABNORMAL LOW (ref >60)
GFR, EST NON AFRICAN AMERICAN: 37 mL/min — AB (ref >60)
GLUCOSE: 218 mg/dL — AB (ref 65–99)
POTASSIUM: 4.3 mmol/L (ref 3.5–5.1)
Sodium: 138 mmol/L (ref 135–145)

## 2016-07-27 NOTE — Pre-Procedure Instructions (Signed)
Patient given information to sign up for my chart at home. 

## 2016-07-30 ENCOUNTER — Encounter (INDEPENDENT_AMBULATORY_CARE_PROVIDER_SITE_OTHER): Payer: Self-pay

## 2016-07-30 ENCOUNTER — Encounter (HOSPITAL_COMMUNITY): Payer: Self-pay | Admitting: Anesthesiology

## 2016-07-30 NOTE — Anesthesia Preprocedure Evaluation (Addendum)
Anesthesia Evaluation  Patient identified by MRN, date of birth, ID band Patient awake    Reviewed: Allergy & Precautions, NPO status , Patient's Chart, lab work & pertinent test results  Airway Mallampati: II  TM Distance: >3 FB Neck ROM: Full    Dental no notable dental hx. (+) Teeth Intact   Pulmonary former smoker,    Pulmonary exam normal breath sounds clear to auscultation       Cardiovascular hypertension, + CAD and + Peripheral Vascular Disease  Normal cardiovascular exam Rhythm:Regular Rate:Normal     Neuro/Psych negative neurological ROS  negative psych ROS   GI/Hepatic Neg liver ROS, Epigastric pain Anemia   Endo/Other  diabetes, Well Controlled, Type 2, Oral Hypoglycemic AgentsGout Hyperlipidemia  Renal/GU Renal InsufficiencyRenal diseaseHx/o renal calculi     Musculoskeletal  (+) Arthritis , Osteoarthritis,    Abdominal   Peds  Hematology  (+) anemia ,   Anesthesia Other Findings   Reproductive/Obstetrics                            Anesthesia Physical Anesthesia Plan  ASA: III  Anesthesia Plan: MAC   Post-op Pain Management:    Induction:   Airway Management Planned: Natural Airway  Additional Equipment:   Intra-op Plan:   Post-operative Plan:   Informed Consent: I have reviewed the patients History and Physical, chart, labs and discussed the procedure including the risks, benefits and alternatives for the proposed anesthesia with the patient or authorized representative who has indicated his/her understanding and acceptance.     Plan Discussed with: Anesthesiologist and CRNA  Anesthesia Plan Comments:         Anesthesia Quick Evaluation

## 2016-08-01 ENCOUNTER — Ambulatory Visit (HOSPITAL_COMMUNITY): Payer: Medicare Other | Admitting: Anesthesiology

## 2016-08-01 ENCOUNTER — Encounter (HOSPITAL_COMMUNITY)
Admission: RE | Disposition: A | Discharge status: Home / Self Care | Payer: Self-pay | Source: Ambulatory Visit | Attending: Internal Medicine

## 2016-08-01 ENCOUNTER — Ambulatory Visit (HOSPITAL_COMMUNITY)
Admission: RE | Admit: 2016-08-01 | Discharge: 2016-08-01 | Disposition: A | Discharge status: Home / Self Care | Payer: Medicare Other | Source: Ambulatory Visit | Attending: Internal Medicine | Admitting: Internal Medicine

## 2016-08-01 ENCOUNTER — Encounter (HOSPITAL_COMMUNITY): Payer: Self-pay | Admitting: *Deleted

## 2016-08-01 DIAGNOSIS — E119 Type 2 diabetes mellitus without complications: Secondary | ICD-10-CM | POA: Diagnosis not present

## 2016-08-01 DIAGNOSIS — I739 Peripheral vascular disease, unspecified: Secondary | ICD-10-CM | POA: Diagnosis not present

## 2016-08-01 DIAGNOSIS — E785 Hyperlipidemia, unspecified: Secondary | ICD-10-CM | POA: Insufficient documentation

## 2016-08-01 DIAGNOSIS — K2210 Ulcer of esophagus without bleeding: Secondary | ICD-10-CM | POA: Diagnosis not present

## 2016-08-01 DIAGNOSIS — K625 Hemorrhage of anus and rectum: Secondary | ICD-10-CM

## 2016-08-01 DIAGNOSIS — K4090 Unilateral inguinal hernia, without obstruction or gangrene, not specified as recurrent: Secondary | ICD-10-CM | POA: Diagnosis not present

## 2016-08-01 DIAGNOSIS — M5136 Other intervertebral disc degeneration, lumbar region: Secondary | ICD-10-CM | POA: Insufficient documentation

## 2016-08-01 DIAGNOSIS — K269 Duodenal ulcer, unspecified as acute or chronic, without hemorrhage or perforation: Secondary | ICD-10-CM | POA: Insufficient documentation

## 2016-08-01 DIAGNOSIS — M1990 Unspecified osteoarthritis, unspecified site: Secondary | ICD-10-CM | POA: Diagnosis not present

## 2016-08-01 DIAGNOSIS — Z86010 Personal history of colonic polyps: Secondary | ICD-10-CM | POA: Diagnosis not present

## 2016-08-01 DIAGNOSIS — K222 Esophageal obstruction: Secondary | ICD-10-CM | POA: Diagnosis not present

## 2016-08-01 DIAGNOSIS — D122 Benign neoplasm of ascending colon: Secondary | ICD-10-CM | POA: Diagnosis not present

## 2016-08-01 DIAGNOSIS — Z87442 Personal history of urinary calculi: Secondary | ICD-10-CM | POA: Insufficient documentation

## 2016-08-01 DIAGNOSIS — K219 Gastro-esophageal reflux disease without esophagitis: Secondary | ICD-10-CM | POA: Insufficient documentation

## 2016-08-01 DIAGNOSIS — K5900 Constipation, unspecified: Secondary | ICD-10-CM | POA: Insufficient documentation

## 2016-08-01 DIAGNOSIS — R1013 Epigastric pain: Secondary | ICD-10-CM | POA: Diagnosis not present

## 2016-08-01 DIAGNOSIS — Z96651 Presence of right artificial knee joint: Secondary | ICD-10-CM | POA: Insufficient documentation

## 2016-08-01 DIAGNOSIS — M109 Gout, unspecified: Secondary | ICD-10-CM | POA: Insufficient documentation

## 2016-08-01 DIAGNOSIS — Z8041 Family history of malignant neoplasm of ovary: Secondary | ICD-10-CM | POA: Insufficient documentation

## 2016-08-01 DIAGNOSIS — D500 Iron deficiency anemia secondary to blood loss (chronic): Secondary | ICD-10-CM | POA: Diagnosis not present

## 2016-08-01 DIAGNOSIS — D649 Anemia, unspecified: Secondary | ICD-10-CM

## 2016-08-01 DIAGNOSIS — K921 Melena: Secondary | ICD-10-CM | POA: Insufficient documentation

## 2016-08-01 DIAGNOSIS — Z87891 Personal history of nicotine dependence: Secondary | ICD-10-CM | POA: Insufficient documentation

## 2016-08-01 DIAGNOSIS — I708 Atherosclerosis of other arteries: Secondary | ICD-10-CM | POA: Diagnosis not present

## 2016-08-01 DIAGNOSIS — K2950 Unspecified chronic gastritis without bleeding: Secondary | ICD-10-CM | POA: Insufficient documentation

## 2016-08-01 DIAGNOSIS — Z8042 Family history of malignant neoplasm of prostate: Secondary | ICD-10-CM | POA: Insufficient documentation

## 2016-08-01 DIAGNOSIS — Z79899 Other long term (current) drug therapy: Secondary | ICD-10-CM | POA: Insufficient documentation

## 2016-08-01 DIAGNOSIS — I10 Essential (primary) hypertension: Secondary | ICD-10-CM | POA: Diagnosis not present

## 2016-08-01 DIAGNOSIS — K621 Rectal polyp: Secondary | ICD-10-CM | POA: Insufficient documentation

## 2016-08-01 HISTORY — PX: BIOPSY: SHX5522

## 2016-08-01 HISTORY — PX: ESOPHAGOGASTRODUODENOSCOPY (EGD) WITH PROPOFOL: SHX5813

## 2016-08-01 HISTORY — PX: POLYPECTOMY: SHX5525

## 2016-08-01 HISTORY — PX: COLONOSCOPY WITH PROPOFOL: SHX5780

## 2016-08-01 LAB — GLUCOSE, CAPILLARY
Glucose-Capillary: 113 mg/dL — ABNORMAL HIGH (ref 65–99)
Glucose-Capillary: 96 mg/dL (ref 65–99)

## 2016-08-01 SURGERY — ESOPHAGOGASTRODUODENOSCOPY (EGD) WITH PROPOFOL
Anesthesia: Monitor Anesthesia Care

## 2016-08-01 MED ORDER — LACTATED RINGERS IV SOLN
INTRAVENOUS | Status: DC | PRN
  Administered 2016-08-01: 09:00:00 via INTRAVENOUS

## 2016-08-01 MED ORDER — PROPOFOL 500 MG/50ML IV EMUL
INTRAVENOUS | Status: DC | PRN
  Administered 2016-08-01: 75 ug/kg/min via INTRAVENOUS
  Administered 2016-08-01: 10:00:00 via INTRAVENOUS

## 2016-08-01 MED ORDER — CHLORHEXIDINE GLUCONATE CLOTH 2 % EX PADS: 6.0000 | MEDICATED_PAD | Freq: Once | CUTANEOUS | Status: DC

## 2016-08-01 MED ORDER — PROPOFOL 10 MG/ML IV BOLUS
INTRAVENOUS | Status: AC
  Filled 2016-08-01: qty 20

## 2016-08-01 MED ORDER — LIDOCAINE VISCOUS 2 % MT SOLN
5.0000 mL | OROMUCOSAL | Status: AC
  Administered 2016-08-01: 5 mL via OROMUCOSAL

## 2016-08-01 MED ORDER — PROPOFOL 10 MG/ML IV BOLUS
INTRAVENOUS | Status: DC | PRN
  Administered 2016-08-01 (×2): 10 mg via INTRAVENOUS

## 2016-08-01 MED ORDER — LIDOCAINE VISCOUS 2 % MT SOLN
OROMUCOSAL | Status: AC
  Filled 2016-08-01: qty 15

## 2016-08-01 MED ORDER — PROPOFOL 10 MG/ML IV BOLUS
INTRAVENOUS | Status: AC
  Filled 2016-08-01: qty 40

## 2016-08-01 MED ORDER — LACTATED RINGERS IV SOLN
INTRAVENOUS | Status: DC
  Administered 2016-08-01: 09:00:00 via INTRAVENOUS

## 2016-08-01 MED ORDER — MIDAZOLAM HCL 5 MG/5ML IJ SOLN
INTRAMUSCULAR | Status: DC | PRN
  Administered 2016-08-01 (×2): 1 mg via INTRAVENOUS

## 2016-08-01 MED ORDER — MIDAZOLAM HCL 2 MG/2ML IJ SOLN
INTRAMUSCULAR | Status: AC
  Filled 2016-08-01: qty 2

## 2016-08-01 NOTE — Anesthesia Postprocedure Evaluation (Signed)
Anesthesia Post Note  Patient: AMEEN CHICO  Procedure(s) Performed: Procedure(s) (LRB): ESOPHAGOGASTRODUODENOSCOPY (EGD) WITH PROPOFOL (N/A) COLONOSCOPY WITH PROPOFOL (N/A) BIOPSY POLYPECTOMY  Patient location during evaluation: PACU Anesthesia Type: MAC Level of consciousness: awake and alert Pain management: pain level controlled Vital Signs Assessment: post-procedure vital signs reviewed and stable Respiratory status: spontaneous breathing Cardiovascular status: blood pressure returned to baseline Postop Assessment: no signs of nausea or vomiting Anesthetic complications: no    Last Vitals:  Vitals:   08/01/16 0915 08/01/16 0930  BP: (!) 141/85 (!) 149/96  Pulse:    Resp: (!) 0 (!) 27  Temp:      Last Pain:  Vitals:   08/01/16 0818  TempSrc: Oral                 Lillyonna Armstead

## 2016-08-01 NOTE — Transfer of Care (Signed)
Immediate Anesthesia Transfer of Care Note  Patient: [name]  Procedure(s) Performed: Procedure(s) with comments: ESOPHAGOGASTRODUODENOSCOPY (EGD) WITH PROPOFOL (N/A) - 815 COLONOSCOPY WITH PROPOFOL (N/A) BIOPSY - gastric POLYPECTOMY - multiple colon polyps  Patient Location: PACU  Anesthesia Type:MAC  Level of Consciousness: awake  Airway & Oxygen Therapy: Patient Spontanous Breathing and Patient connected to nasal cannula oxygen  Post-op Assessment: Report given to RN  Post vital signs: Reviewed and stable  Last Vitals:  Vitals:   08/01/16 0915 08/01/16 0930  BP: (!) 141/85 (!) 149/96  Pulse:    Resp: (!) 0 (!) 27  Temp:      Last Pain:  Vitals:   08/01/16 0818  TempSrc: Oral      Patients Stated Pain Goal: 5 ([id number])  Complications: No apparent anesthesia complications

## 2016-08-01 NOTE — Op Note (Signed)
[facility] Patient Name: [name] Procedure Date: 08/01/2016 9:55 AM MRN: [id number] Date of Birth: Feb 06, 1936 Attending MD: Norvel Richards , MD CSN: [id number] Age: 80 Admit Type: Outpatient Procedure:                Upper GI endoscopy with gastrtic biopsy Indications:              Dyspepsia Providers:                Norvel Richards, MD, Jeanann Lewandowsky. Sharon Seller, RN,                            Randa Spike, Technician Referring MD:              Medicines:                Propofol per Anesthesia Complications:            No immediate complications. Estimated Blood Loss:     Estimated blood loss was minimal. Procedure:                Pre-Anesthesia Assessment:                           - Prior to the procedure, a History and Physical                            was performed, and patient medications and                            allergies were reviewed. The patient's tolerance of                            previous anesthesia was also reviewed. The risks                            and benefits of the procedure and the sedation                            options and risks were discussed with the patient.                            All questions were answered, and informed consent                            was obtained. Prior Anticoagulants: The patient has                            taken no previous anticoagulant or antiplatelet                            agents. ASA Grade Assessment: II - A patient with                            mild systemic disease. After reviewing the risks  and benefits, the patient was deemed in                            satisfactory condition to undergo the procedure.                           After obtaining informed consent, the endoscope was                            passed under direct vision. Throughout the                            procedure, the patient's blood pressure, pulse, and    oxygen saturations were monitored continuously. The                            [id number]) scope was introduced through the                            mouth, and advanced to the second part of duodenum.                            The upper GI endoscopy was accomplished without                            difficulty. The patient tolerated the procedure                            well. Scope In: 10:05:04 AM Scope Out: 10:09:12 AM Total Procedure Duration: 0 hours 4 minutes 8 seconds  Findings:      One superficial esophageal ulcer and no stigmata of recent bleeding was       found 37 to 38 cm from the incisors. The lesion was 4 mm in largest       dimension. Noncritical Schatzki's ring.      Localized moderately erythematous mucosa without bleeding was found in       the stomach.      One non-bleeding cratered duodenal ulcer with no stigmata of bleeding       was found in the first portion of the duodenum. The lesion was 7 mm in       largest dimension.      Multiple localized erosions without bleeding were found in the duodenal       bulb. Impression:               - Esophageal ulcer.                           - Erythematous mucosa in the stomach.                           - One non-bleeding duodenal ulcer with no stigmata                            of bleeding.                           -  Duodenal erosions without bleeding.                           - No specimens collected. Moderate Sedation:      Moderate (conscious) sedation was personally administered by an       anesthesia professional. The following parameters were monitored: oxygen       saturation, heart rate, blood pressure, respiratory rate, EKG, adequacy       of pulmonary ventilation, and response to care. Total physician       intraservice time was 8 minutes. Recommendation:           - Patient has a contact number available for                            emergencies. The signs and symptoms of potential                             delayed complications were discussed with the                            patient. Return to normal activities tomorrow.                            Written discharge instructions were provided to the                            patient.                           - Patient has a contact number available for                            emergencies. The signs and symptoms of potential                            delayed complications were discussed with the                            patient. Return to normal activities tomorrow.                            Written discharge instructions were provided to the                            patient.                           - Advance diet as tolerated.                           - Continue present medications.                           - Await pathology results.                           -  No repeat upper endoscopy.                           - Return to GI office in 12 weeks. no nonsteroidal                            agents. Begin Protonix 40 mg daily. Office visit                            with [place] in 12 weeks see colonoscopy report. Procedure Code(s):        --- Professional ---                           [phone], Esophagogastroduodenoscopy, flexible,                            transoral; diagnostic, including collection of                            specimen(s) by brushing or washing, when performed                            (separate procedure) Diagnosis Code(s):        --- Professional ---                           K22.10, Ulcer of esophagus without bleeding                           K31.89, Other diseases of stomach and duodenum                           K26.9, Duodenal ulcer, unspecified as acute or                            chronic, without hemorrhage or perforation                           R10.13, Epigastric pain CPT copyright 2016 American Medical Association. All rights reserved. The codes documented in this report are  preliminary and upon coder review may  be revised to meet current compliance requirements. Cristopher Estimable. Leanor Voris, MD Norvel Richards, MD 08/01/2016 10:36:49 AM This report has been signed electronically. Number of Addenda: 0

## 2016-08-01 NOTE — Anesthesia Postprocedure Evaluation (Signed)
Anesthesia Post Note  Patient: [name]  Procedure(s) Performed: Procedure(s) (LRB): ESOPHAGOGASTRODUODENOSCOPY (EGD) WITH PROPOFOL (N/A) COLONOSCOPY WITH PROPOFOL (N/A) BIOPSY POLYPECTOMY  Patient location during evaluation: PACU Anesthesia Type: MAC Level of consciousness: awake and alert and oriented Pain management: pain level controlled Vital Signs Assessment: post-procedure vital signs reviewed and stable Respiratory status: spontaneous breathing, nonlabored ventilation and respiratory function stable Cardiovascular status: stable and blood pressure returned to baseline Postop Assessment: no signs of nausea or vomiting Anesthetic complications: no    Last Vitals:  Vitals:   08/01/16 1047 08/01/16 1048  BP:    Pulse: 64   Resp:  16  Temp:      Last Pain:  Vitals:   08/01/16 0818  TempSrc: Oral                 Patrick Francois A.

## 2016-08-01 NOTE — Interval H&P Note (Signed)
History and Physical Interval Note:  08/01/2016 9:45 AM  [name]  has presented today for surgery, with the diagnosis of RECTAL BLEEDING/ANEMIA/EPIG PAIN  The various methods of treatment have been discussed with the patient and family. After consideration of risks, benefits and other options for treatment, the patient has consented to  Procedure(s) with comments: ESOPHAGOGASTRODUODENOSCOPY (EGD) WITH PROPOFOL (N/A) - 815 COLONOSCOPY WITH PROPOFOL (N/A) as a surgical intervention .  The patient's history has been reviewed, patient examined, no change in status, stable for surgery.  I have reviewed the patient's chart and labs.  Questions were answered to the patient's satisfaction.     [name]  No change. Patient denies dysphagia. Diagnostic EGD and colonoscopy per plan.  The risks, benefits, limitations, imponderables and alternatives regarding both EGD and colonoscopy have been reviewed with the patient. Questions have been answered. All parties agreeable.

## 2016-08-01 NOTE — Discharge Instructions (Addendum)
Colon Polyps Polyps are lumps of extra tissue growing inside the body. Polyps can grow in the large intestine (colon). Most colon polyps are noncancerous (benign). However, some colon polyps can become cancerous over time. Polyps that are larger than a pea may be harmful. To be safe, caregivers remove and test all polyps. CAUSES  Polyps form when mutations in the genes cause your cells to grow and divide even though no more tissue is needed. RISK FACTORS There are a number of risk factors that can increase your chances of getting colon polyps. They include:  Being older than 50 years.  Family history of colon polyps or colon cancer.  Long-term colon diseases, such as colitis or Crohn disease.  Being overweight.  Smoking.  Being inactive.  Drinking too much alcohol. SYMPTOMS  Most small polyps do not cause symptoms. If symptoms are present, they may include:  Blood in the stool. The stool may look dark red or black.  Constipation or diarrhea that lasts longer than 1 week. DIAGNOSIS People often do not know they have polyps until their caregiver finds them during a regular checkup. Your caregiver can use 4 tests to check for polyps:  Digital rectal exam. The caregiver wears gloves and feels inside the rectum. This test would find polyps only in the rectum.  Barium enema. The caregiver puts a liquid called barium into your rectum before taking X-rays of your colon. Barium makes your colon look white. Polyps are dark, so they are easy to see in the X-ray pictures.  Sigmoidoscopy. A thin, flexible tube (sigmoidoscope) is placed into your rectum. The sigmoidoscope has a light and tiny camera in it. The caregiver uses the sigmoidoscope to look at the last third of your colon.  Colonoscopy. This test is like sigmoidoscopy, but the caregiver looks at the entire colon. This is the most common method for finding and removing polyps. TREATMENT  Any polyps will be removed during a  sigmoidoscopy or colonoscopy. The polyps are then tested for cancer. PREVENTION  To help lower your risk of getting more colon polyps:  Eat plenty of fruits and vegetables. Avoid eating fatty foods.  Do not smoke.  Avoid drinking alcohol.  Exercise every day.  Lose weight if recommended by your caregiver.  Eat plenty of calcium and folate. Foods that are rich in calcium include milk, cheese, and broccoli. Foods that are rich in folate include chickpeas, kidney beans, and spinach. HOME CARE INSTRUCTIONS Keep all follow-up appointments as directed by your caregiver. You may need periodic exams to check for polyps. SEEK MEDICAL CARE IF: You notice bleeding during a bowel movement.   This information is not intended to replace advice given to you by your health care provider. Make sure you discuss any questions you have with your health care provider.   Document Released: 08/24/2004 Document Revised: 12/19/2014 Document Reviewed: 02/07/2012 Elsevier Interactive Patient Education 2016 [place]. Gastroesophageal Reflux Disease, Adult Normally, food travels down the esophagus and stays in the stomach to be digested. However, when a person has gastroesophageal reflux disease (GERD), food and stomach acid move back up into the esophagus. When this happens, the esophagus becomes sore and inflamed. Over time, GERD can create small holes (ulcers) in the lining of the esophagus.  CAUSES This condition is caused by a problem with the muscle between the esophagus and the stomach (lower esophageal sphincter, or LES). Normally, the LES muscle closes after food passes through the esophagus to the stomach. When the LES is weakened  or abnormal, it does not close properly, and that allows food and stomach acid to go back up into the esophagus. The LES can be weakened by certain dietary substances, medicines, and medical conditions, including:  Tobacco use.  Pregnancy.  Having a hiatal  hernia.  Heavy alcohol use.  Certain foods and beverages, such as coffee, chocolate, onions, and peppermint. RISK FACTORS This condition is more likely to develop in:  People who have an increased body weight.  People who have connective tissue disorders.  People who use NSAID medicines. SYMPTOMS Symptoms of this condition include:  Heartburn.  Difficult or painful swallowing.  The feeling of having a lump in the throat.  Abitter taste in the mouth.  Bad breath.  Having a large amount of saliva.  Having an upset or bloated stomach.  Belching.  Chest pain.  Shortness of breath or wheezing.  Ongoing (chronic) cough or a night-time cough.  Wearing away of tooth enamel.  Weight loss. Different conditions can cause chest pain. Make sure to see your health care provider if you experience chest pain. DIAGNOSIS Your health care provider will take a medical history and perform a physical exam. To determine if you have mild or severe GERD, your health care provider may also monitor how you respond to treatment. You may also have other tests, including:  An endoscopy toexamine your stomach and esophagus with a small camera.  A test thatmeasures the acidity level in your esophagus.  A test thatmeasures how much pressure is on your esophagus.  A barium swallow or modified barium swallow to show the shape, size, and functioning of your esophagus. TREATMENT The goal of treatment is to help relieve your symptoms and to prevent complications. Treatment for this condition may vary depending on how severe your symptoms are. Your health care provider may recommend:  Changes to your diet.  Medicine.  Surgery. HOME CARE INSTRUCTIONS Diet  Follow a diet as recommended by your health care provider. This may involve avoiding foods and drinks such as:  Coffee and tea (with or without caffeine).  Drinks that containalcohol.  Energy drinks and sports  drinks.  Carbonated drinks or sodas.  Chocolate and cocoa.  Peppermint and mint flavorings.  Garlic and onions.  Horseradish.  Spicy and acidic foods, including peppers, chili powder, curry powder, vinegar, hot sauces, and barbecue sauce.  Citrus fruit juices and citrus fruits, such as oranges, lemons, and limes.  Tomato-based foods, such as red sauce, chili, salsa, and pizza with red sauce.  Fried and fatty foods, such as donuts, french fries, potato chips, and high-fat dressings.  High-fat meats, such as hot dogs and fatty cuts of red and white meats, such as rib eye steak, sausage, ham, and bacon.  High-fat dairy items, such as whole milk, butter, and cream cheese.  Eat small, frequent meals instead of large meals.  Avoid drinking large amounts of liquid with your meals.  Avoid eating meals during the 2-3 hours before bedtime.  Avoid lying down right after you eat.  Do not exercise right after you eat. General Instructions  Pay attention to any changes in your symptoms.  Take over-the-counter and prescription medicines only as told by your health care provider. Do not take aspirin, ibuprofen, or other NSAIDs unless your health care provider told you to do so.  Do not use any tobacco products, including cigarettes, chewing tobacco, and e-cigarettes. If you need help quitting, ask your health care provider.  Wear loose-fitting clothing. Do not wear anything  tight around your waist that causes pressure on your abdomen.  Raise (elevate) the head of your bed 6 inches (15cm).  Try to reduce your stress, such as with yoga or meditation. If you need help reducing stress, ask your health care provider.  If you are overweight, reduce your weight to an amount that is healthy for you. Ask your health care provider for guidance about a safe weight loss goal.  Keep all follow-up visits as told by your health care provider. This is important. SEEK MEDICAL CARE IF:  You have  new symptoms.  You have unexplained weight loss.  You have difficulty swallowing, or it hurts to swallow.  You have wheezing or a persistent cough.  Your symptoms do not improve with treatment.  You have a hoarse voice. SEEK IMMEDIATE MEDICAL CARE IF:  You have pain in your arms, neck, jaw, teeth, or back.  You feel sweaty, dizzy, or light-headed.  You have chest pain or shortness of breath.  You vomit and your vomit looks like blood or coffee grounds.  You faint.  Your stool is bloody or black.  You cannot swallow, drink, or eat.   This information is not intended to replace advice given to you by your health care provider. Make sure you discuss any questions you have with your health care provider.   Document Released: 09/07/2005 Document Revised: 08/19/2015 Document Reviewed: 03/25/2015 Elsevier Interactive Patient Education 2016 [employer] Peptic Ulcer A peptic ulcer is a sore in the lining of your esophagus (esophageal ulcer), stomach (gastric ulcer), or in the first part of your small intestine (duodenal ulcer). The ulcer causes erosion into the deeper tissue. CAUSES  Normally, the lining of the stomach and the small intestine protects itself from the acid that digests food. The protective lining can be damaged by:  An infection caused by a bacterium called Helicobacter pylori (H. pylori).  Regular use of nonsteroidal anti-inflammatory drugs (NSAIDs), such as ibuprofen or aspirin.  Smoking tobacco. Other risk factors include being older than 58, drinking alcohol excessively, and having a family history of ulcer disease.  SYMPTOMS   Burning pain or gnawing in the area between the chest and the belly button.  Heartburn.  Nausea and vomiting.  Bloating. The pain can be worse on an empty stomach and at night. If the ulcer results in bleeding, it can cause:  Black, tarry stools.  Vomiting of bright red blood.  Vomiting of coffee-ground-looking  materials. DIAGNOSIS  A diagnosis is usually made based upon your history and an exam. Other tests and procedures may be performed to find the cause of the ulcer. Finding a cause will help determine the best treatment. Tests and procedures may include:  Blood tests, stool tests, or breath tests to check for the bacterium H. pylori.  An upper gastrointestinal (GI) series of the esophagus, stomach, and small intestine.  An endoscopy to examine the esophagus, stomach, and small intestine.  A biopsy. TREATMENT  Treatment may include:  Eliminating the cause of the ulcer, such as smoking, NSAIDs, or alcohol.  Medicines to reduce the amount of acid in your digestive tract.  Antibiotic medicines if the ulcer is caused by the H. pylori bacterium.  An upper endoscopy to treat a bleeding ulcer.  Surgery if the bleeding is severe or if the ulcer created a hole somewhere in the digestive system. HOME CARE INSTRUCTIONS   Avoid tobacco, alcohol, and caffeine. Smoking can increase the acid in the stomach, and continued smoking will impair  the healing of ulcers.  Avoid foods and drinks that seem to cause discomfort or aggravate your ulcer.  Only take medicines as directed by your caregiver. Do not substitute over-the-counter medicines for prescription medicines without talking to your caregiver.  Keep any follow-up appointments and tests as directed. SEEK MEDICAL CARE IF:   Your do not improve within 7 days of starting treatment.  You have ongoing indigestion or heartburn. SEEK IMMEDIATE MEDICAL CARE IF:   You have sudden, sharp, or persistent abdominal pain.  You have bloody or dark black, tarry stools.  You vomit blood or vomit that looks like coffee grounds.  You become light-headed, weak, or feel faint.  You become sweaty or clammy. MAKE SURE YOU:   Understand these instructions.  Will watch your condition.  Will get help right away if you are not doing well or get worse.    This information is not intended to replace advice given to you by your health care provider. Make sure you discuss any questions you have with your health care provider.   Document Released: 11/25/2000 Document Revised: 12/19/2014 Document Reviewed: 06/27/2012 Elsevier Interactive Patient Education 2016 [employer]  Colonoscopy Discharge Instructions  Read the instructions outlined below and refer to this sheet in the next few weeks. These discharge instructions provide you with general information on caring for yourself after you leave the hospital. Your doctor may also give you specific instructions. While your treatment has been planned according to the most current medical practices available, unavoidable complications occasionally occur. If you have any problems or questions after discharge, call Dr. Gala Romney at [phone]. ACTIVITY  You may resume your regular activity, but move at a slower pace for the next 24 hours.   Take frequent rest periods for the next 24 hours.   Walking will help get rid of the air and reduce the bloated feeling in your belly (abdomen).   No driving for 24 hours (because of the medicine (anesthesia) used during the test).    Do not sign any important legal documents or operate any machinery for 24 hours (because of the anesthesia used during the test).  NUTRITION  Drink plenty of fluids.   You may resume your normal diet as instructed by your doctor.   Begin with a light meal and progress to your normal diet. Heavy or fried foods are harder to digest and may make you feel sick to your stomach (nauseated).   Avoid alcoholic beverages for 24 hours or as instructed.  MEDICATIONS  You may resume your normal medications unless your doctor tells you otherwise.  WHAT YOU CAN EXPECT TODAY  Some feelings of bloating in the abdomen.   Passage of more gas than usual.   Spotting of blood in your stool or on the toilet paper.  IF YOU HAD POLYPS REMOVED  DURING THE COLONOSCOPY:  No aspirin products for 7 days or as instructed.   No alcohol for 7 days or as instructed.   Eat a soft diet for the next 24 hours.  FINDING OUT THE RESULTS OF YOUR TEST Not all test results are available during your visit. If your test results are not back during the visit, make an appointment with your caregiver to find out the results. Do not assume everything is normal if you have not heard from your caregiver or the medical facility. It is important for you to follow up on all of your test results.  SEEK IMMEDIATE MEDICAL ATTENTION IF:  You have more than  a spotting of blood in your stool.   Your belly is swollen (abdominal distention).   You are nauseated or vomiting.   You have a temperature over 101.   You have abdominal pain or discomfort that is severe or gets worse throughout the day.  EGD Discharge instructions Please read the instructions outlined below and refer to this sheet in the next few weeks. These discharge instructions provide you with general information on caring for yourself after you leave the hospital. Your doctor may also give you specific instructions. While your treatment has been planned according to the most current medical practices available, unavoidable complications occasionally occur. If you have any problems or questions after discharge, please call your doctor. ACTIVITY  You may resume your regular activity but move at a slower pace for the next 24 hours.   Take frequent rest periods for the next 24 hours.   Walking will help expel (get rid of) the air and reduce the bloated feeling in your abdomen.   No driving for 24 hours (because of the anesthesia (medicine) used during the test).   You may shower.   Do not sign any important legal documents or operate any machinery for 24 hours (because of the anesthesia used during the test).  NUTRITION  Drink plenty of fluids.   You may resume your normal diet.   Begin  with a light meal and progress to your normal diet.   Avoid alcoholic beverages for 24 hours or as instructed by your caregiver.  MEDICATIONS  You may resume your normal medications unless your caregiver tells you otherwise.  WHAT YOU CAN EXPECT TODAY  You may experience abdominal discomfort such as a feeling of fullness or gas pains.  FOLLOW-UP  Your doctor will discuss the results of your test with you.  SEEK IMMEDIATE MEDICAL ATTENTION IF ANY OF THE FOLLOWING OCCUR:  Excessive nausea (feeling sick to your stomach) and/or vomiting.   Severe abdominal pain and distention (swelling).   Trouble swallowing.   Temperature over 101 F (37.8 C).   Rectal bleeding or vomiting of blood.   Peptic ulcer disease, GERD and colon polyps information provided  Stop ranitidine; begin Protonix 40 mg daily  Avoid nonsteroidal drugs like Aleve/Advil/aspirin  Further recommendations to follow pending review of pathology report  Office visit with [place] in 3 months
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
## 2016-08-01 NOTE — Op Note (Signed)
[facility] Patient Name: [name] Procedure Date: 08/01/2016 10:09 AM MRN: [id number] Date of Birth: Aug 27, 1936 Attending MD: Norvel Richards , MD CSN: [id number] Age: 80 Admit Type: Outpatient Procedure:                Colonoscopy with snare polypectomy Indications:              High risk colon cancer surveillance: Personal                            history of colonic polyps Providers:                Norvel Richards, MD, Gwenlyn Fudge RN, RN,                            Randa Spike, Technician Referring MD:              Medicines:                Propofol per Anesthesia Complications:            No immediate complications. Estimated Blood Loss:     Estimated blood loss was minimal. Procedure:                Pre-Anesthesia Assessment:                           - Prior to the procedure, a History and Physical                            was performed, and patient medications and                            allergies were reviewed. The patient's tolerance of                            previous anesthesia was also reviewed. The risks                            and benefits of the procedure and the sedation                            options and risks were discussed with the patient.                            All questions were answered, and informed consent                            was obtained. Prior Anticoagulants: The patient has                            taken no previous anticoagulant or antiplatelet                            agents. ASA Grade Assessment: II - A patient with  mild systemic disease. After reviewing the risks                            and benefits, the patient was deemed in                            satisfactory condition to undergo the procedure.                           - Prior to the procedure, a History and Physical                            was performed, and patient medications and                             allergies were reviewed. The patient's tolerance of                            previous anesthesia was also reviewed. The risks                            and benefits of the procedure and the sedation                            options and risks were discussed with the patient.                            All questions were answered, and informed consent                            was obtained. Prior Anticoagulants: The patient has                            taken no previous anticoagulant or antiplatelet                            agents. ASA Grade Assessment: III - A patient with                            severe systemic disease. After reviewing the risks                            and benefits, the patient was deemed in                            satisfactory condition to undergo the procedure.                           After obtaining informed consent, the colonoscope                            was passed under direct vision. Throughout the  procedure, the patient's blood pressure, pulse, and                            oxygen saturations were monitored continuously. The                            [id number] scope was introduced through                            the anus and advanced to the the cecum, identified                            by appendiceal orifice and ileocecal valve. The                            ileocecal valve, appendiceal orifice, and rectum                            were photographed. The entire colon was well                            visualized. The colonoscopy was performed without                            difficulty. The patient tolerated the procedure                            well. The quality of the bowel preparation was                            adequate. Scope In: 10:14:59 AM Scope Out: 10:29:01 AM Scope Withdrawal Time: 0 hours 9 minutes 39 seconds  Total Procedure Duration: 0 hours 14 minutes 2 seconds   Findings:      The perianal and digital rectal examinations were normal.      A 5 mm polyp was found in the rectum. The polyp was pedunculated. The       polyp was removed with a cold snare. Resection and retrieval were       complete. Estimated blood loss was minimal.      A 6 mm polyp was found in the ascending colon. The polyp was       semi-pedunculated. The polyp was removed with a cold snare. Resection       and retrieval were complete. Estimated blood loss was minimal. Estimated       blood loss was minimal.      The exam was otherwise without abnormality on direct and retroflexion       views. Impression:               - One 5 mm polyp in the rectum, removed with a cold                            snare. Resected and retrieved.                           - One 6 mm  polyp in the ascending colon, removed                            with a cold snare. Resected and retrieved.                           - The examination was otherwise normal on direct                            and retroflexion views. Moderate Sedation:      Moderate (conscious) sedation was personally administered by an       anesthesia professional. The following parameters were monitored: oxygen       saturation, heart rate, blood pressure, respiratory rate, EKG, adequacy       of pulmonary ventilation, and response to care. Total physician       intraservice time was 30 minutes. Recommendation:           - Patient has a contact number available for                            emergencies. The signs and symptoms of potential                            delayed complications were discussed with the                            patient. Return to normal activities tomorrow.                            Written discharge instructions were provided to the                            patient.                           - Advance diet as tolerated.                           - Continue present medications.                            - Repeat colonoscopy date to be determined after                            pending pathology results are reviewed for                            surveillance based on pathology results.                           - Return to GI office in 12 weeks. See colonoscopy                            report Procedure Code(s):        --- Professional ---  45385, Colonoscopy, flexible; with removal of                            tumor(s), polyp(s), or other lesion(s) by snare                            technique Diagnosis Code(s):        --- Professional ---                           Z86.010, Personal history of colonic polyps                           K62.1, Rectal polyp                           D12.2, Benign neoplasm of ascending colon CPT copyright 2016 American Medical Association. All rights reserved. The codes documented in this report are preliminary and upon coder review may  be revised to meet current compliance requirements. Cristopher Estimable. Carry Ortez, MD Norvel Richards, MD 08/01/2016 10:42:19 AM This report has been signed electronically. Number of Addenda: 0

## 2016-08-04 ENCOUNTER — Encounter: Payer: Self-pay | Admitting: Internal Medicine

## 2016-08-04 ENCOUNTER — Encounter (HOSPITAL_COMMUNITY): Payer: Self-pay | Admitting: Internal Medicine

## 2016-08-05 NOTE — Progress Notes (Signed)
Please let patient know, Dr. Gala Romney would like him to follow-up with his PCP regarding bump in creatinine (may indicate dehydration or declining kidney function).  He also needs to have CBC, iron/TIBC, ferritin prior to this office visit with me in November.

## 2016-08-05 NOTE — Progress Notes (Signed)
Also note, patient had duodenal and esophageal ulcer on EGD. He was not on a PPI when I saw him in the office. Please find out if he is on a PPI now. If not let me know and we will start 1.

## 2016-08-10 ENCOUNTER — Other Ambulatory Visit: Payer: Self-pay | Admitting: Gastroenterology

## 2016-08-10 DIAGNOSIS — D500 Iron deficiency anemia secondary to blood loss (chronic): Secondary | ICD-10-CM

## 2016-08-10 NOTE — Progress Notes (Signed)
Almyra Free, I'm not sure if Ginger took care of future labs, nothing documented that I could find.

## 2016-08-24 ENCOUNTER — Other Ambulatory Visit: Payer: Self-pay | Admitting: Internal Medicine

## 2016-09-13 ENCOUNTER — Other Ambulatory Visit: Payer: Self-pay

## 2016-09-13 DIAGNOSIS — D500 Iron deficiency anemia secondary to blood loss (chronic): Secondary | ICD-10-CM

## 2016-09-20 ENCOUNTER — Ambulatory Visit (INDEPENDENT_AMBULATORY_CARE_PROVIDER_SITE_OTHER): Payer: Medicare Other | Admitting: Internal Medicine

## 2016-09-20 ENCOUNTER — Other Ambulatory Visit (INDEPENDENT_AMBULATORY_CARE_PROVIDER_SITE_OTHER): Payer: Medicare Other

## 2016-09-20 ENCOUNTER — Encounter: Payer: Self-pay | Admitting: Internal Medicine

## 2016-09-20 ENCOUNTER — Ambulatory Visit: Payer: PRIVATE HEALTH INSURANCE | Admitting: Internal Medicine

## 2016-09-20 ENCOUNTER — Other Ambulatory Visit: Payer: Self-pay | Admitting: Internal Medicine

## 2016-09-20 VITALS — BP 140/82 | HR 80 | Temp 97.9°F | Resp 20 | Wt 231.0 lb

## 2016-09-20 DIAGNOSIS — Z0001 Encounter for general adult medical examination with abnormal findings: Secondary | ICD-10-CM

## 2016-09-20 DIAGNOSIS — E119 Type 2 diabetes mellitus without complications: Secondary | ICD-10-CM | POA: Diagnosis not present

## 2016-09-20 DIAGNOSIS — N182 Chronic kidney disease, stage 2 (mild): Secondary | ICD-10-CM

## 2016-09-20 DIAGNOSIS — I10 Essential (primary) hypertension: Secondary | ICD-10-CM | POA: Diagnosis not present

## 2016-09-20 DIAGNOSIS — D500 Iron deficiency anemia secondary to blood loss (chronic): Secondary | ICD-10-CM | POA: Diagnosis not present

## 2016-09-20 DIAGNOSIS — E291 Testicular hypofunction: Secondary | ICD-10-CM

## 2016-09-20 LAB — CBC WITH DIFFERENTIAL/PLATELET
BASOS ABS: 0.1 10*3/uL (ref 0.0–0.1)
Basophils Relative: 0.7 % (ref 0.0–3.0)
EOS ABS: 0.7 10*3/uL (ref 0.0–0.7)
Eosinophils Relative: 7.3 % — ABNORMAL HIGH (ref 0.0–5.0)
HEMATOCRIT: 39 % (ref 39.0–52.0)
Hemoglobin: 12.8 g/dL — ABNORMAL LOW (ref 13.0–17.0)
LYMPHS ABS: 1.6 10*3/uL (ref 0.7–4.0)
LYMPHS PCT: 16.4 % (ref 12.0–46.0)
MCHC: 32.9 g/dL (ref 30.0–36.0)
MCV: 78.3 fl (ref 78.0–100.0)
MONOS PCT: 10.4 % (ref 3.0–12.0)
Monocytes Absolute: 1 10*3/uL (ref 0.1–1.0)
NEUTROS ABS: 6.4 10*3/uL (ref 1.4–7.7)
NEUTROS PCT: 65.2 % (ref 43.0–77.0)
PLATELETS: 289 10*3/uL (ref 150.0–400.0)
RBC: 4.98 Mil/uL (ref 4.22–5.81)
RDW: 16.7 % — ABNORMAL HIGH (ref 11.5–15.5)
WBC: 9.8 10*3/uL (ref 4.0–10.5)

## 2016-09-20 LAB — HEPATIC FUNCTION PANEL
ALBUMIN: 4.4 g/dL (ref 3.5–5.2)
ALT: 14 U/L (ref 0–53)
AST: 18 U/L (ref 0–37)
Alkaline Phosphatase: 116 U/L (ref 39–117)
Bilirubin, Direct: 0 mg/dL (ref 0.0–0.3)
TOTAL PROTEIN: 7.8 g/dL (ref 6.0–8.3)
Total Bilirubin: 0.6 mg/dL (ref 0.2–1.2)

## 2016-09-20 LAB — BASIC METABOLIC PANEL
BUN: 31 mg/dL — ABNORMAL HIGH (ref 6–23)
CALCIUM: 9.7 mg/dL (ref 8.4–10.5)
CHLORIDE: 103 meq/L (ref 96–112)
CO2: 28 meq/L (ref 19–32)
CREATININE: 1.67 mg/dL — AB (ref 0.40–1.50)
GFR: 42.46 mL/min — ABNORMAL LOW (ref >60.00)
GLUCOSE: 98 mg/dL (ref 70–99)
Potassium: 4.1 mEq/L (ref 3.5–5.1)
Sodium: 141 mEq/L (ref 135–145)

## 2016-09-20 LAB — LDL CHOLESTEROL, DIRECT: Direct LDL: 175 mg/dL

## 2016-09-20 LAB — LIPID PANEL
CHOLESTEROL: 263 mg/dL — AB (ref 0–200)
HDL: 49.8 mg/dL (ref >39.00)
NonHDL: 212.71
TRIGLYCERIDES: 251 mg/dL — AB (ref 0.0–149.0)
Total CHOL/HDL Ratio: 5
VLDL: 50.2 mg/dL — ABNORMAL HIGH (ref 0.0–40.0)

## 2016-09-20 MED ORDER — PANTOPRAZOLE SODIUM 40 MG PO TBEC: 40.0000 mg | DELAYED_RELEASE_TABLET | Freq: Every day | ORAL | 3 refills | Status: DC

## 2016-09-20 MED ORDER — ATORVASTATIN CALCIUM 20 MG PO TABS: 20.0000 mg | ORAL_TABLET | Freq: Every day | ORAL | 3 refills | Status: DC

## 2016-09-20 NOTE — Patient Instructions (Signed)

## 2016-09-20 NOTE — Progress Notes (Signed)
Pre visit review using our clinic review tool, if applicable. No additional management support is needed unless otherwise documented below in the visit note. 
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
## 2016-09-20 NOTE — Assessment & Plan Note (Signed)
stable overall by history and exam, recent data reviewed with pt, and pt to continue medical treatment as before,  to f/u any worsening symptoms or concerns Lab Results  Component Value Date   WBC 10.1 07/27/2016   HGB 10.4 (L) 07/27/2016   HCT 32.6 (L) 07/27/2016   MCV 89.8 07/27/2016   PLT 302 07/27/2016   For f/u GI as planned

## 2016-09-20 NOTE — Assessment & Plan Note (Signed)
stable overall by history and exam, recent data reviewed with pt, and pt to continue medical treatment as before,  to f/u any worsening symptoms or concerns BP Readings from Last 3 Encounters:  09/20/16 140/82  08/01/16 140/83  07/30/16 132/82

## 2016-09-20 NOTE — Assessment & Plan Note (Addendum)
Mild worsening at last lab aug 16, for f/u today, not volume depleted by exam today Lab Results  Component Value Date   CREATININE 1.71 (H) 07/27/2016

## 2016-10-19 DIAGNOSIS — L570 Actinic keratosis: Secondary | ICD-10-CM | POA: Diagnosis not present

## 2016-10-19 DIAGNOSIS — L219 Seborrheic dermatitis, unspecified: Secondary | ICD-10-CM | POA: Diagnosis not present

## 2016-10-25 ENCOUNTER — Encounter: Payer: Self-pay | Admitting: Internal Medicine

## 2016-10-25 ENCOUNTER — Ambulatory Visit (INDEPENDENT_AMBULATORY_CARE_PROVIDER_SITE_OTHER): Payer: Medicare Other | Admitting: Internal Medicine

## 2016-10-25 VITALS — BP 140/80 | HR 90 | Temp 98.2°F | Resp 20 | Wt 245.0 lb

## 2016-10-25 DIAGNOSIS — E785 Hyperlipidemia, unspecified: Secondary | ICD-10-CM

## 2016-10-25 DIAGNOSIS — N182 Chronic kidney disease, stage 2 (mild): Secondary | ICD-10-CM

## 2016-10-25 DIAGNOSIS — I10 Essential (primary) hypertension: Secondary | ICD-10-CM

## 2016-10-25 DIAGNOSIS — I5031 Acute diastolic (congestive) heart failure: Secondary | ICD-10-CM | POA: Diagnosis not present

## 2016-10-25 MED ORDER — AMLODIPINE BESYLATE 5 MG PO TABS: 5.0000 mg | ORAL_TABLET | Freq: Every day | ORAL | 3 refills | Status: DC

## 2016-10-25 MED ORDER — GLIPIZIDE ER 10 MG PO TB24: 10.0000 mg | ORAL_TABLET | Freq: Every day | ORAL | 3 refills | Status: DC

## 2016-10-25 MED ORDER — FUROSEMIDE 40 MG PO TABS: 40.0000 mg | ORAL_TABLET | Freq: Every day | ORAL | 3 refills | Status: DC

## 2016-10-25 MED ORDER — PANTOPRAZOLE SODIUM 40 MG PO TBEC: 40.0000 mg | DELAYED_RELEASE_TABLET | Freq: Every day | ORAL | 3 refills | Status: DC

## 2016-10-25 MED ORDER — ATORVASTATIN CALCIUM 20 MG PO TABS: 20.0000 mg | ORAL_TABLET | Freq: Every day | ORAL | 3 refills | Status: DC

## 2016-10-25 NOTE — Progress Notes (Signed)
Pre visit review using our clinic review tool, if applicable. No additional management support is needed unless otherwise documented below in the visit note. 
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
## 2016-10-25 NOTE — Patient Instructions (Signed)
OK to increase the lasix to 40 mg per day  Please check your weight in the AM every day and call if weight is not dropping (and swelling going down ) by at least 5 lbs in the next week  Please continue all other medications as before, and ALL refills have been done  Please have the pharmacy call with any other refills you may need.  Please keep your appointments with your specialists as you may have planned  You will be contacted regarding the referral for: echocardiogram  Please return in 1 months, or sooner if needed

## 2016-10-26 DIAGNOSIS — D500 Iron deficiency anemia secondary to blood loss (chronic): Secondary | ICD-10-CM | POA: Diagnosis not present

## 2016-10-27 LAB — CBC WITH DIFFERENTIAL/PLATELET
BASOS ABS: 0 {cells}/uL (ref 0–200)
Basophils Relative: 0 %
EOS ABS: 738 {cells}/uL — AB (ref 15–500)
Eosinophils Relative: 9 %
HEMATOCRIT: 36.4 % — AB (ref 38.5–50.0)
HEMOGLOBIN: 11.7 g/dL — AB (ref 13.2–17.1)
LYMPHS ABS: 1722 {cells}/uL (ref 850–3900)
Lymphocytes Relative: 21 %
MCH: 25.4 pg — AB (ref 27.0–33.0)
MCHC: 32.1 g/dL (ref 32.0–36.0)
MCV: 79 fL — AB (ref 80.0–100.0)
MONO ABS: 820 {cells}/uL (ref 200–950)
MPV: 9.5 fL (ref 7.5–12.5)
Monocytes Relative: 10 %
NEUTROS PCT: 60 %
Neutro Abs: 4920 cells/uL (ref 1500–7800)
Platelets: 256 10*3/uL (ref 140–400)
RBC: 4.61 MIL/uL (ref 4.20–5.80)
RDW: 17.1 % — ABNORMAL HIGH (ref 11.0–15.0)
WBC: 8.2 10*3/uL (ref 3.8–10.8)

## 2016-10-27 LAB — IRON AND TIBC
%SAT: 16 % (ref 15–60)
Iron: 62 ug/dL (ref 50–180)
TIBC: 386 ug/dL (ref 250–425)
UIBC: 324 ug/dL (ref 125–400)

## 2016-10-27 LAB — FERRITIN: FERRITIN: 36 ng/mL (ref 20–380)

## 2016-10-28 ENCOUNTER — Encounter (INDEPENDENT_AMBULATORY_CARE_PROVIDER_SITE_OTHER): Payer: Self-pay | Admitting: Orthopaedic Surgery

## 2016-10-28 ENCOUNTER — Ambulatory Visit (INDEPENDENT_AMBULATORY_CARE_PROVIDER_SITE_OTHER): Payer: Medicare Other | Admitting: Orthopaedic Surgery

## 2016-10-28 VITALS — BP 150/82 | HR 68 | Resp 14 | Ht 77.0 in | Wt 234.0 lb

## 2016-10-28 DIAGNOSIS — Z96651 Presence of right artificial knee joint: Secondary | ICD-10-CM | POA: Diagnosis not present

## 2016-10-28 DIAGNOSIS — Z96652 Presence of left artificial knee joint: Secondary | ICD-10-CM | POA: Diagnosis not present
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
## 2016-10-30 DIAGNOSIS — I5031 Acute diastolic (congestive) heart failure: Secondary | ICD-10-CM | POA: Insufficient documentation

## 2016-10-30 DIAGNOSIS — I5032 Chronic diastolic (congestive) heart failure: Secondary | ICD-10-CM | POA: Insufficient documentation

## 2016-10-30 NOTE — Assessment & Plan Note (Signed)
stable overall by history and exam, recent data reviewed with pt, and pt to continue medical treatment as before,  to f/u any worsening symptoms or concerns Lab Results  Component Value Date   LDLCALC 72 03/17/2016

## 2016-10-30 NOTE — Assessment & Plan Note (Signed)
Clinical dx, type not yet known, for echo, and to increase the lasix to 40 qd, check wts daily at home, call in 5-7 days if wt and swelling not improved

## 2016-10-30 NOTE — Assessment & Plan Note (Signed)
stable overall by history and exam, recent data reviewed with pt, and pt to continue medical treatment as before,  to f/u any worsening symptoms or concerns Lab Results  Component Value Date   CREATININE 1.67 (H) 09/20/2016

## 2016-10-30 NOTE — Assessment & Plan Note (Signed)
Elevated today likely situational, o/w stable overall by history and exam, recent data reviewed with pt, and pt to continue medical treatment as before,  to f/u any worsening symptoms or concerns BP Readings from Last 3 Encounters:  10/28/16 (!) 150/82  10/25/16 140/80  09/20/16 140/82

## 2016-10-31 NOTE — Progress Notes (Signed)
Some decline in H/H. Normal anemia labs although low normal ferritin and iron.  Patient has ov tomorrow. Will discuss then.

## 2016-11-01 ENCOUNTER — Ambulatory Visit (INDEPENDENT_AMBULATORY_CARE_PROVIDER_SITE_OTHER): Payer: Medicare Other | Admitting: Gastroenterology

## 2016-11-01 ENCOUNTER — Other Ambulatory Visit: Payer: Self-pay | Admitting: Gastroenterology

## 2016-11-01 ENCOUNTER — Encounter: Payer: Self-pay | Admitting: Gastroenterology

## 2016-11-01 DIAGNOSIS — D649 Anemia, unspecified: Secondary | ICD-10-CM

## 2016-11-01 DIAGNOSIS — K5900 Constipation, unspecified: Secondary | ICD-10-CM | POA: Diagnosis not present

## 2016-11-01 DIAGNOSIS — D509 Iron deficiency anemia, unspecified: Secondary | ICD-10-CM | POA: Diagnosis not present

## 2016-11-01 NOTE — Patient Instructions (Signed)
1. You can take Miralax once daily as needed for constipation. 2. Complete stool test to check for blood in your stool. Return to our office.

## 2016-11-01 NOTE — Progress Notes (Signed)
Saw patient in the office today. See office note for complete details. Patient will be completing I FOBT. Suspect element of anemia of chronic disease in the setting of renal insufficiency.  Go ahead and NIC for CBC, iron/TIBC, ferritin in 4 months. Return to the office in 4 months with Dr. Gala Romney.

## 2016-11-01 NOTE — Assessment & Plan Note (Signed)
Currently managing with prune juice. He can take MiraLAX 17 g nightly as needed for constipation.

## 2016-11-01 NOTE — Assessment & Plan Note (Signed)
Mild microcytic anemia with normal iron and ferritin, TIBC. Suspect element of anemia chronic disease in the setting of renal insufficiency. No longer on NSAIDs. On PPI therapy given upper endoscopy findings previously. Discussed with patient and wife. To wrap up GI workup, would complete I FOBT. If positive consider small bowel capsule endoscopy, if negative follow anemia for stability.

## 2016-11-01 NOTE — Progress Notes (Signed)
Primary Care Physician: Cathlean Cower, MD  Primary Gastroenterologist:  Garfield Cornea, MD   Chief Complaint  Patient presents with  . Rectal Bleeding    not having any now, had TCS  . Constipation    prune juice is helping    HPI: [name] is a 80 y.o. male here for follow-up. He underwent EGD and colonoscopy back in August 2017. 2 polyps removed from the colon, one was to adenoma. He had one superficial esophageal ulcer with no stigmata of bleeding, noncritical Schatzki ring, erythematous mucosa in the stomach, one nonbleeding duodenal ulcer with no stigmata bleeding. He was started on protonic 40 mg daily. Advised to avoid NSAIDs.  Clinically patient states he's doing well. He manages his constipation with prune juice. Denies any further rectal bleeding. No abdominal pain. Appetite is good. Denies heartburn, indigestion, dysphagia.  Hemoglobin has been in the 10-12 range over the last 6 months. Back in October was 12.8, down to 11.7 last week. Some declining MCV from the 90 range down to 78.3 last month but up to 79 most recently. Iron, TIBC, ferritin are normal. Iron and ferritin are low normal. He has had several episodes of elevated creatinine, tells me that his PCP has been following this. Anemia may be in part due to anemia of chronic disease related to renal insufficiency.   Current Outpatient Prescriptions  Medication Sig Dispense Refill  . amLODipine (NORVASC) 5 MG tablet Take 1 tablet (5 mg total) by mouth daily. 90 tablet 3  . atorvastatin (LIPITOR) 20 MG tablet Take 1 tablet (20 mg total) by mouth daily. 90 tablet 3  . fluticasone (FLONASE) 50 MCG/ACT nasal spray Place 2 sprays into both nostrils daily as needed. (Patient taking differently: Place 2 sprays into both nostrils daily as needed for allergies. ) 16 g 5  . furosemide (LASIX) 40 MG tablet Take 1 tablet (40 mg total) by mouth daily. 90 tablet 3  . glipiZIDE (GLUCOTROL XL) 10 MG 24 hr tablet Take 1 tablet  (10 mg total) by mouth daily with breakfast. 90 tablet 3  . pantoprazole (PROTONIX) 40 MG tablet Take 1 tablet (40 mg total) by mouth daily. 90 tablet 3  . senna (SENOKOT) 8.6 MG TABS tablet Take 1 tablet (8.6 mg total) by mouth daily. 120 each 0  . testosterone cypionate (DEPOTESTOSTERONE CYPIONATE) 200 MG/ML injection INJECT 1 ML IM EVERY 14 DAYS 10 mL 2   No current facility-administered medications for this visit.     Allergies as of 11/01/2016 - Review Complete 11/01/2016  Allergen Reaction Noted  . Labetalol hcl Swelling   . Shellfish allergy Swelling 08/27/2015    ROS:  General: Negative for anorexia, weight loss, fever, chills, fatigue, weakness. ENT: Negative for hoarseness, difficulty swallowing , nasal congestion. CV: Negative for chest pain, angina, palpitations, dyspnea on exertion, peripheral edema.  Respiratory: Negative for dyspnea at rest, dyspnea on exertion, cough, sputum, wheezing.  GI: See history of present illness. GU:  Negative for dysuria, hematuria, urinary incontinence, urinary frequency, nocturnal urination.  Endo: Negative for unusual weight change.    Physical Examination:   BP (!) 164/91   Pulse 70   Temp 97.9 F (36.6 C) (Oral)   Ht 6\' 6"  (1.981 m)   Wt 233 lb 12.8 oz (106.1 kg)   BMI 27.02 kg/m   General: Well-nourished, well-developed in no acute distress.  Eyes: No icterus. Mouth: Oropharyngeal mucosa moist and pink , no lesions erythema or exudate. Lungs:  Clear to auscultation bilaterally.  Heart: Regular rate and rhythm, no murmurs rubs or gallops.  Abdomen: Bowel sounds are normal, nontender, nondistended, no hepatosplenomegaly or masses, no abdominal bruits or hernia , no rebound or guarding.   Extremities: No lower extremity edema. No clubbing or deformities. Neuro: Alert and oriented x 4   Skin: Warm and dry, no jaundice.   Psych: Alert and cooperative, normal mood and affect.  Labs:  Lab Results  Component Value Date   WBC 8.2  10/26/2016   HGB 11.7 (L) 10/26/2016   HCT 36.4 (L) 10/26/2016   MCV 79.0 (L) 10/26/2016   PLT 256 10/26/2016   Lab Results  Component Value Date   CREATININE 1.67 (H) 09/20/2016   BUN 31 (H) 09/20/2016   NA 141 09/20/2016   K 4.1 09/20/2016   CL 103 09/20/2016   CO2 28 09/20/2016   Lab Results  Component Value Date   ALT 14 09/20/2016   AST 18 09/20/2016   ALKPHOS 116 09/20/2016   BILITOT 0.6 09/20/2016   Lab Results  Component Value Date   IRON 62 10/26/2016   TIBC 386 10/26/2016   FERRITIN 36 10/26/2016    Imaging Studies: No results found.

## 2016-11-02 NOTE — Progress Notes (Signed)
cc'ed to pcp °

## 2016-11-02 NOTE — Progress Notes (Signed)
ON RECALL  °

## 2016-11-14 ENCOUNTER — Ambulatory Visit (INDEPENDENT_AMBULATORY_CARE_PROVIDER_SITE_OTHER): Payer: Medicare Other

## 2016-11-14 ENCOUNTER — Encounter (INDEPENDENT_AMBULATORY_CARE_PROVIDER_SITE_OTHER): Payer: Self-pay | Admitting: Orthopaedic Surgery

## 2016-11-14 ENCOUNTER — Ambulatory Visit (INDEPENDENT_AMBULATORY_CARE_PROVIDER_SITE_OTHER): Payer: Medicare Other | Admitting: Orthopaedic Surgery

## 2016-11-14 VITALS — BP 178/89 | HR 76 | Resp 14 | Ht 78.0 in | Wt 235.0 lb

## 2016-11-14 DIAGNOSIS — M25552 Pain in left hip: Secondary | ICD-10-CM | POA: Diagnosis not present

## 2016-11-14 DIAGNOSIS — G8929 Other chronic pain: Secondary | ICD-10-CM | POA: Diagnosis not present

## 2016-11-14 DIAGNOSIS — M25551 Pain in right hip: Secondary | ICD-10-CM | POA: Diagnosis not present

## 2016-11-14 DIAGNOSIS — M545 Low back pain: Secondary | ICD-10-CM

## 2016-11-14 DIAGNOSIS — K625 Hemorrhage of anus and rectum: Secondary | ICD-10-CM

## 2016-11-14 LAB — IFOBT (OCCULT BLOOD): IFOBT: NEGATIVE

## 2016-11-14 MED ORDER — LIDOCAINE HCL 2 % IJ SOLN
2.0000 mL | INTRAMUSCULAR | Status: AC | PRN
  Administered 2016-11-14: 2 mL

## 2016-11-14 MED ORDER — METHYLPREDNISOLONE ACETATE 40 MG/ML IJ SUSP
40.0000 mg | INTRAMUSCULAR | Status: AC | PRN
  Administered 2016-11-14: 40 mg via INTRAMUSCULAR
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
## 2016-11-14 NOTE — Progress Notes (Signed)
Pt returned the IFOBT test and it was NEGATIVE

## 2016-11-20 NOTE — Progress Notes (Signed)
ifobt negative. Labs and ov with RMR as planned for 02/2017

## 2016-11-23 ENCOUNTER — Ambulatory Visit: Payer: Medicare Other | Admitting: Internal Medicine

## 2016-11-28 ENCOUNTER — Ambulatory Visit (INDEPENDENT_AMBULATORY_CARE_PROVIDER_SITE_OTHER): Payer: Medicare Other | Admitting: Orthopaedic Surgery

## 2016-11-28 ENCOUNTER — Encounter (INDEPENDENT_AMBULATORY_CARE_PROVIDER_SITE_OTHER): Payer: Self-pay | Admitting: Orthopaedic Surgery

## 2016-11-28 VITALS — Ht 78.0 in | Wt 235.0 lb

## 2016-11-28 DIAGNOSIS — M545 Low back pain: Secondary | ICD-10-CM

## 2016-11-28 DIAGNOSIS — G8929 Other chronic pain: Secondary | ICD-10-CM

## 2016-11-28 MED ORDER — LIDOCAINE HCL 1 % IJ SOLN
2.0000 mL | INTRAMUSCULAR | Status: AC | PRN
  Administered 2016-11-28: 2 mL

## 2016-11-28 MED ORDER — METHYLPREDNISOLONE ACETATE 40 MG/ML IJ SUSP
40.0000 mg | INTRAMUSCULAR | Status: AC | PRN
  Administered 2016-11-28: 40 mg via INTRAMUSCULAR
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
## 2016-11-28 NOTE — Progress Notes (Signed)
   Procedure Note  Patient: [name]             Date of Birth: [birth year]           MRN: [id number]             Visit Date: 11/28/2016  Procedures: Visit Diagnoses: Chronic right-sided low back pain without sciatica  Trigger Point Inj Date/Time: 11/28/2016 8:38 AM Performed by: Garald Balding Authorized by: Garald Balding   Consent Given by:  Patient Timeout: prior to procedure the correct patient, procedure, and site was verified   Indications:  Pain Total # of Trigger Points:  1 Location: back   Needle Size:  25 G Medications #1:  2 mL lidocaine 1 %; 40 mg methylPREDNISolone acetate 40 MG/ML   I saw [name] recently for evaluation of right sided low back pain. He did have a localized area of tenderness. In the past he has responded nicely to cortisone injection. Accordingly I injected that area notes that he did very well only to have it recur to "some extent". He like to have another injection. He is not complaining of right groin pain. He's not specifically having pain in the lumbar spine although he does have x-rays consistent with degenerative arthrosis of his lumbar spine. He is not complaining of any pain referred to either lower extremity.  Straight leg raise is negative bilaterally. He was not having any specific pain in his right hip with range of motion. He does have localized tenderness in the right paralumbar region with a small fibroadipose nodule.

## 2016-12-16 ENCOUNTER — Other Ambulatory Visit: Payer: Self-pay | Admitting: Internal Medicine

## 2016-12-16 NOTE — Telephone Encounter (Signed)
Done erx 

## 2016-12-22 ENCOUNTER — Encounter: Payer: Self-pay | Admitting: Internal Medicine

## 2016-12-22 ENCOUNTER — Ambulatory Visit (INDEPENDENT_AMBULATORY_CARE_PROVIDER_SITE_OTHER): Payer: Medicare Other | Admitting: Internal Medicine

## 2016-12-22 DIAGNOSIS — I5031 Acute diastolic (congestive) heart failure: Secondary | ICD-10-CM

## 2016-12-22 DIAGNOSIS — E119 Type 2 diabetes mellitus without complications: Secondary | ICD-10-CM | POA: Diagnosis not present

## 2016-12-22 DIAGNOSIS — I10 Essential (primary) hypertension: Secondary | ICD-10-CM

## 2016-12-22 DIAGNOSIS — N182 Chronic kidney disease, stage 2 (mild): Secondary | ICD-10-CM | POA: Diagnosis not present

## 2016-12-22 MED ORDER — HYDRALAZINE HCL 25 MG PO TABS: 25.0000 mg | ORAL_TABLET | Freq: Three times a day (TID) | ORAL | 11 refills | Status: DC | PRN
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
## 2016-12-22 NOTE — Progress Notes (Signed)
Pre visit review using our clinic review tool, if applicable. No additional management support is needed unless otherwise documented below in the visit note. 

## 2016-12-22 NOTE — Assessment & Plan Note (Signed)
Lab Results  Component Value Date   HGBA1C 6.8 (H) 03/17/2016   stable overall by history and exam, recent data reviewed with pt, and pt to continue medical treatment as before,  to f/u any worsening symptoms or concerns

## 2016-12-22 NOTE — Assessment & Plan Note (Signed)
Mild to mod elevated, for low salt diet, wt loss, add hydralazine 25 tid prn, declines other chagnes such as increased amlodipine due to risk of edema, and ACE to worsening renal fxn, to f/u any worsening symptoms or concerns

## 2016-12-22 NOTE — Assessment & Plan Note (Signed)
Compensated,  to f/u any worsening symptoms or concerns

## 2016-12-22 NOTE — Assessment & Plan Note (Signed)
stable overall by history and exam, recent data reviewed with pt, and pt to continue medical treatment as before,  to f/u any worsening symptoms or concerns / Lab Results  Component Value Date   CREATININE 1.67 (H) 09/20/2016

## 2016-12-22 NOTE — Patient Instructions (Addendum)
Please take all medications and follow a No Added Salt diet  Please take all new medication as prescribed - the hydralazine 25 mg up to three times per day for BP > 160/110  Please continue all other medications as before, and refills have been done if requested.  Please have the pharmacy call with any other refills you may need.  Please keep your appointments with your specialists as you may have planned

## 2016-12-30 ENCOUNTER — Other Ambulatory Visit (INDEPENDENT_AMBULATORY_CARE_PROVIDER_SITE_OTHER): Payer: Self-pay

## 2016-12-30 ENCOUNTER — Encounter (INDEPENDENT_AMBULATORY_CARE_PROVIDER_SITE_OTHER): Payer: Self-pay | Admitting: Orthopaedic Surgery

## 2016-12-30 ENCOUNTER — Ambulatory Visit (INDEPENDENT_AMBULATORY_CARE_PROVIDER_SITE_OTHER): Payer: Medicare Other | Admitting: Orthopaedic Surgery

## 2016-12-30 VITALS — BP 124/73 | HR 77

## 2016-12-30 DIAGNOSIS — M545 Low back pain: Principal | ICD-10-CM

## 2016-12-30 DIAGNOSIS — M5442 Lumbago with sciatica, left side: Secondary | ICD-10-CM | POA: Diagnosis not present

## 2016-12-30 DIAGNOSIS — M5441 Lumbago with sciatica, right side: Secondary | ICD-10-CM

## 2016-12-30 DIAGNOSIS — G8929 Other chronic pain: Secondary | ICD-10-CM
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
## 2017-01-02 ENCOUNTER — Ambulatory Visit
Admission: RE | Admit: 2017-01-02 | Discharge: 2017-01-02 | Disposition: A | Discharge status: Home / Self Care | Payer: Medicare Other | Source: Ambulatory Visit | Attending: Orthopaedic Surgery | Admitting: Orthopaedic Surgery

## 2017-01-02 DIAGNOSIS — G8929 Other chronic pain: Secondary | ICD-10-CM

## 2017-01-02 DIAGNOSIS — M545 Low back pain: Principal | ICD-10-CM

## 2017-01-02 DIAGNOSIS — M48061 Spinal stenosis, lumbar region without neurogenic claudication: Secondary | ICD-10-CM | POA: Diagnosis not present

## 2017-01-04 ENCOUNTER — Encounter (INDEPENDENT_AMBULATORY_CARE_PROVIDER_SITE_OTHER): Payer: Self-pay | Admitting: Orthopaedic Surgery
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
## 2017-01-05 ENCOUNTER — Encounter (INDEPENDENT_AMBULATORY_CARE_PROVIDER_SITE_OTHER): Payer: Self-pay | Admitting: Orthopaedic Surgery

## 2017-01-05 ENCOUNTER — Ambulatory Visit (INDEPENDENT_AMBULATORY_CARE_PROVIDER_SITE_OTHER): Payer: Medicare Other | Admitting: Orthopaedic Surgery

## 2017-01-05 VITALS — Ht 78.0 in | Wt 235.0 lb

## 2017-01-05 DIAGNOSIS — M25512 Pain in left shoulder: Secondary | ICD-10-CM

## 2017-01-19 ENCOUNTER — Encounter: Payer: Self-pay | Admitting: Internal Medicine

## 2017-01-23 ENCOUNTER — Other Ambulatory Visit: Payer: Self-pay

## 2017-01-23 DIAGNOSIS — D649 Anemia, unspecified: Secondary | ICD-10-CM

## 2017-02-07 DIAGNOSIS — D649 Anemia, unspecified: Secondary | ICD-10-CM | POA: Diagnosis not present

## 2017-02-08 LAB — CBC WITH DIFFERENTIAL/PLATELET
BASOS ABS: 0 {cells}/uL (ref 0–200)
Basophils Relative: 0 %
EOS PCT: 7 %
Eosinophils Absolute: 609 cells/uL — ABNORMAL HIGH (ref 15–500)
HEMATOCRIT: 38.4 % — AB (ref 38.5–50.0)
Hemoglobin: 12.2 g/dL — ABNORMAL LOW (ref 13.2–17.1)
LYMPHS ABS: 1740 {cells}/uL (ref 850–3900)
LYMPHS PCT: 20 %
MCH: 27.6 pg (ref 27.0–33.0)
MCHC: 31.8 g/dL — AB (ref 32.0–36.0)
MCV: 86.9 fL (ref 80.0–100.0)
MPV: 9.5 fL (ref 7.5–12.5)
Monocytes Absolute: 609 cells/uL (ref 200–950)
Monocytes Relative: 7 %
NEUTROS PCT: 66 %
Neutro Abs: 5742 cells/uL (ref 1500–7800)
Platelets: 308 10*3/uL (ref 140–400)
RBC: 4.42 MIL/uL (ref 4.20–5.80)
RDW: 16.2 % — ABNORMAL HIGH (ref 11.0–15.0)
WBC: 8.7 10*3/uL (ref 3.8–10.8)

## 2017-02-08 LAB — IRON AND TIBC
%SAT: 14 % — ABNORMAL LOW (ref 15–60)
IRON: 46 ug/dL — AB (ref 50–180)
TIBC: 319 ug/dL (ref 250–425)
UIBC: 273 ug/dL (ref 125–400)

## 2017-02-08 LAB — FERRITIN: FERRITIN: 41 ng/mL (ref 20–380)

## 2017-02-08 NOTE — Progress Notes (Signed)
Some decline in iron/sat% but Hgb better but not quite normal. Likely mixed anemia picture.  Keep upcoming OV with RMR this week to discuss if needs SB capsule endoscopy.

## 2017-02-10 ENCOUNTER — Encounter: Payer: Self-pay | Admitting: Internal Medicine

## 2017-02-10 ENCOUNTER — Ambulatory Visit (INDEPENDENT_AMBULATORY_CARE_PROVIDER_SITE_OTHER): Payer: Medicare Other | Admitting: Internal Medicine

## 2017-02-10 VITALS — BP 151/82 | HR 77 | Temp 97.8°F | Ht 78.0 in | Wt 238.6 lb

## 2017-02-10 DIAGNOSIS — K279 Peptic ulcer, site unspecified, unspecified as acute or chronic, without hemorrhage or perforation: Secondary | ICD-10-CM | POA: Diagnosis not present

## 2017-02-10 DIAGNOSIS — K2100 Gastro-esophageal reflux disease with esophagitis, without bleeding: Secondary | ICD-10-CM

## 2017-02-10 DIAGNOSIS — D638 Anemia in other chronic diseases classified elsewhere: Secondary | ICD-10-CM | POA: Diagnosis not present
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
## 2017-02-10 NOTE — Patient Instructions (Signed)
Continue to avoid NSAIDs/ aspirin compounds  Continue Protonix 40 mg daily  Return stool sample for occult blood testing  CBC, ferritin, IBC, iron level in 3 mos just before OV with [place]

## 2017-02-13 ENCOUNTER — Other Ambulatory Visit: Payer: Self-pay

## 2017-02-13 DIAGNOSIS — D509 Iron deficiency anemia, unspecified: Secondary | ICD-10-CM

## 2017-02-15 ENCOUNTER — Telehealth (HOSPITAL_COMMUNITY): Payer: Self-pay | Admitting: Internal Medicine

## 2017-02-16 NOTE — Telephone Encounter (Signed)
Pt's wife returned my call from yesterday and getting [name] scheduled for an echocardiogram that Dr. Jenny Reichmann wanted him to have. The patient voiced that he did not want to have the test done and he was feeling much better. The patient's order will be removed from the workqueue at this time.

## 2017-03-21 ENCOUNTER — Encounter: Payer: Self-pay | Admitting: Internal Medicine

## 2017-03-21 ENCOUNTER — Other Ambulatory Visit (INDEPENDENT_AMBULATORY_CARE_PROVIDER_SITE_OTHER): Payer: Medicare Other

## 2017-03-21 ENCOUNTER — Ambulatory Visit (INDEPENDENT_AMBULATORY_CARE_PROVIDER_SITE_OTHER): Payer: Medicare Other | Admitting: Internal Medicine

## 2017-03-21 VITALS — BP 144/76 | HR 72 | Temp 98.4°F | Ht 78.0 in | Wt 241.0 lb

## 2017-03-21 DIAGNOSIS — Z0000 Encounter for general adult medical examination without abnormal findings: Secondary | ICD-10-CM

## 2017-03-21 DIAGNOSIS — E119 Type 2 diabetes mellitus without complications: Secondary | ICD-10-CM

## 2017-03-21 LAB — LIPID PANEL
CHOL/HDL RATIO: 4
Cholesterol: 168 mg/dL (ref 0–200)
HDL: 47 mg/dL (ref >39.00)
LDL CALC: 98 mg/dL (ref 0–99)
NonHDL: 121.39
Triglycerides: 117 mg/dL (ref 0.0–149.0)
VLDL: 23.4 mg/dL (ref 0.0–40.0)

## 2017-03-21 LAB — CBC WITH DIFFERENTIAL/PLATELET
BASOS PCT: 0.6 % (ref 0.0–3.0)
Basophils Absolute: 0 10*3/uL (ref 0.0–0.1)
EOS PCT: 6.7 % — AB (ref 0.0–5.0)
Eosinophils Absolute: 0.5 10*3/uL (ref 0.0–0.7)
HCT: 39.5 % (ref 39.0–52.0)
HEMOGLOBIN: 12.8 g/dL — AB (ref 13.0–17.0)
LYMPHS ABS: 1.4 10*3/uL (ref 0.7–4.0)
Lymphocytes Relative: 17.6 % (ref 12.0–46.0)
MCHC: 32.5 g/dL (ref 30.0–36.0)
MCV: 86.8 fl (ref 78.0–100.0)
MONO ABS: 1 10*3/uL (ref 0.1–1.0)
Monocytes Relative: 12.5 % — ABNORMAL HIGH (ref 3.0–12.0)
Neutro Abs: 5 10*3/uL (ref 1.4–7.7)
Neutrophils Relative %: 62.6 % (ref 43.0–77.0)
Platelets: 230 10*3/uL (ref 150.0–400.0)
RBC: 4.54 Mil/uL (ref 4.22–5.81)
RDW: 15.5 % (ref 11.5–15.5)
WBC: 7.9 10*3/uL (ref 4.0–10.5)

## 2017-03-21 LAB — HEPATIC FUNCTION PANEL
ALK PHOS: 105 U/L (ref 39–117)
ALT: 10 U/L (ref 0–53)
AST: 12 U/L (ref 0–37)
Albumin: 4.2 g/dL (ref 3.5–5.2)
BILIRUBIN TOTAL: 0.5 mg/dL (ref 0.2–1.2)
Bilirubin, Direct: 0.1 mg/dL (ref 0.0–0.3)
Total Protein: 6.9 g/dL (ref 6.0–8.3)

## 2017-03-21 LAB — URINALYSIS, ROUTINE W REFLEX MICROSCOPIC
Bilirubin Urine: NEGATIVE
Hgb urine dipstick: NEGATIVE
Ketones, ur: NEGATIVE
Leukocytes, UA: NEGATIVE
NITRITE: NEGATIVE
RBC / HPF: NONE SEEN (ref 0–?)
SPECIFIC GRAVITY, URINE: 1.02 (ref 1.000–1.030)
Total Protein, Urine: NEGATIVE
Urine Glucose: NEGATIVE
Urobilinogen, UA: 0.2 (ref 0.0–1.0)
WBC, UA: NONE SEEN (ref 0–?)
pH: 5.5 (ref 5.0–8.0)

## 2017-03-21 LAB — BASIC METABOLIC PANEL
BUN: 35 mg/dL — ABNORMAL HIGH (ref 6–23)
CO2: 27 mEq/L (ref 19–32)
Calcium: 9.5 mg/dL (ref 8.4–10.5)
Chloride: 103 mEq/L (ref 96–112)
Creatinine, Ser: 1.66 mg/dL — ABNORMAL HIGH (ref 0.40–1.50)
GFR: 42.7 mL/min — ABNORMAL LOW (ref >60.00)
Glucose, Bld: 110 mg/dL — ABNORMAL HIGH (ref 70–99)
POTASSIUM: 4.7 meq/L (ref 3.5–5.1)
SODIUM: 139 meq/L (ref 135–145)

## 2017-03-21 LAB — MICROALBUMIN / CREATININE URINE RATIO
Creatinine,U: 106.7 mg/dL
Microalb Creat Ratio: 3.3 mg/g (ref 0.0–30.0)
Microalb, Ur: 3.5 mg/dL — ABNORMAL HIGH (ref 0.0–1.9)

## 2017-03-21 LAB — TSH: TSH: 1.22 u[IU]/mL (ref 0.35–4.50)

## 2017-03-21 LAB — HEMOGLOBIN A1C: Hgb A1c MFr Bld: 6.7 % — ABNORMAL HIGH (ref 4.6–6.5)

## 2017-03-21 LAB — PSA: PSA: 1.02 ng/mL (ref 0.10–4.00)

## 2017-03-21 NOTE — Progress Notes (Signed)
Pre visit review using our clinic review tool, if applicable. No additional management support is needed unless otherwise documented below in the visit note. 
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
## 2017-03-21 NOTE — Assessment & Plan Note (Signed)

## 2017-03-21 NOTE — Assessment & Plan Note (Signed)
.  stable overall by history and exam, recent data reviewed with pt, and pt to continue medical treatment as before,  to f/u any worsening symptoms or concerns Lab Results  Component Value Date   HGBA1C 6.8 (H) 03/17/2016

## 2017-03-21 NOTE — Patient Instructions (Signed)

## 2017-04-12 ENCOUNTER — Telehealth: Payer: Self-pay | Admitting: Internal Medicine

## 2017-04-12 MED ORDER — AMLODIPINE BESYLATE 10 MG PO TABS: 10.0000 mg | ORAL_TABLET | Freq: Every day | ORAL | 3 refills | Status: DC

## 2017-04-12 NOTE — Telephone Encounter (Signed)
LVM stating that new script has been sent to pharmacy with medication increase.

## 2017-04-12 NOTE — Telephone Encounter (Signed)
BP Readings from Last 3 Encounters:  03/21/17 (!) 144/76  02/10/17 (!) 151/82  12/30/16 124/73   Ok to increaese the amlod to 10 qd - done erx

## 2017-04-12 NOTE — Telephone Encounter (Signed)
Pts wife called and said that they would like to go ahead and increase the dose from 5mg  to 10mg  on the amLODipine prescription. This should be sent in to [facility] on [address].

## 2017-05-01 ENCOUNTER — Other Ambulatory Visit: Payer: Self-pay

## 2017-05-01 DIAGNOSIS — D509 Iron deficiency anemia, unspecified: Secondary | ICD-10-CM

## 2017-06-13 DIAGNOSIS — E119 Type 2 diabetes mellitus without complications: Secondary | ICD-10-CM | POA: Diagnosis not present

## 2017-06-13 DIAGNOSIS — H2513 Age-related nuclear cataract, bilateral: Secondary | ICD-10-CM | POA: Diagnosis not present

## 2017-06-13 DIAGNOSIS — H524 Presbyopia: Secondary | ICD-10-CM | POA: Diagnosis not present

## 2017-06-13 DIAGNOSIS — H52201 Unspecified astigmatism, right eye: Secondary | ICD-10-CM | POA: Diagnosis not present

## 2017-06-13 DIAGNOSIS — H5203 Hypermetropia, bilateral: Secondary | ICD-10-CM | POA: Diagnosis not present

## 2017-06-13 LAB — HM DIABETES EYE EXAM

## 2017-07-07 ENCOUNTER — Telehealth: Payer: Self-pay | Admitting: Internal Medicine

## 2017-07-07 MED ORDER — GLUCOSE BLOOD VI STRP: 1.0000 | ORAL_STRIP | 1 refills | Status: DC | PRN

## 2017-07-07 NOTE — Telephone Encounter (Signed)
Reviewed chart pt is up-to-date sent refills to sent strips to [facility].../lmb

## 2017-07-07 NOTE — Telephone Encounter (Signed)
Pharmacy called stating they need a refill of ONE TOUCH ULTRA TEST test strip  To check bS 2x per day The last time I see this prescribed is 2015 They did not have record of this in their system   Please advise

## 2017-07-12 ENCOUNTER — Other Ambulatory Visit: Payer: Self-pay | Admitting: Internal Medicine

## 2017-07-12 ENCOUNTER — Encounter: Payer: Self-pay | Admitting: Internal Medicine

## 2017-07-12 MED ORDER — GLUCOSE BLOOD VI STRP: 1.0000 | ORAL_STRIP | 1 refills | Status: AC | PRN

## 2017-08-16 ENCOUNTER — Other Ambulatory Visit: Payer: Self-pay | Admitting: Dermatology

## 2017-08-16 DIAGNOSIS — D492 Neoplasm of unspecified behavior of bone, soft tissue, and skin: Secondary | ICD-10-CM | POA: Diagnosis not present

## 2017-08-16 DIAGNOSIS — L281 Prurigo nodularis: Secondary | ICD-10-CM | POA: Diagnosis not present

## 2017-08-16 DIAGNOSIS — L309 Dermatitis, unspecified: Secondary | ICD-10-CM | POA: Diagnosis not present

## 2017-08-16 DIAGNOSIS — D229 Melanocytic nevi, unspecified: Secondary | ICD-10-CM | POA: Diagnosis not present

## 2017-08-16 DIAGNOSIS — L570 Actinic keratosis: Secondary | ICD-10-CM | POA: Diagnosis not present

## 2017-09-28 ENCOUNTER — Other Ambulatory Visit: Payer: Self-pay | Admitting: Internal Medicine

## 2017-10-20 ENCOUNTER — Other Ambulatory Visit: Payer: Self-pay | Admitting: Internal Medicine

## 2017-10-20 NOTE — Telephone Encounter (Signed)
Done hardcopy to [facility]  

## 2017-10-20 NOTE — Telephone Encounter (Signed)
faxed

## 2017-10-21 ENCOUNTER — Observation Stay (HOSPITAL_COMMUNITY)
Admission: EM | Admit: 2017-10-21 | Discharge: 2017-10-23 | Disposition: A | Discharge status: Home / Self Care | Payer: Medicare Other | Attending: Internal Medicine | Admitting: Internal Medicine

## 2017-10-21 ENCOUNTER — Other Ambulatory Visit: Payer: Self-pay

## 2017-10-21 ENCOUNTER — Encounter (HOSPITAL_COMMUNITY): Payer: Self-pay | Admitting: Emergency Medicine

## 2017-10-21 ENCOUNTER — Emergency Department (HOSPITAL_COMMUNITY): Payer: Medicare Other

## 2017-10-21 DIAGNOSIS — T18128A Food in esophagus causing other injury, initial encounter: Secondary | ICD-10-CM | POA: Diagnosis not present

## 2017-10-21 DIAGNOSIS — I5032 Chronic diastolic (congestive) heart failure: Secondary | ICD-10-CM | POA: Insufficient documentation

## 2017-10-21 DIAGNOSIS — E785 Hyperlipidemia, unspecified: Secondary | ICD-10-CM | POA: Diagnosis not present

## 2017-10-21 DIAGNOSIS — K920 Hematemesis: Secondary | ICD-10-CM | POA: Diagnosis not present

## 2017-10-21 DIAGNOSIS — Z8711 Personal history of peptic ulcer disease: Secondary | ICD-10-CM | POA: Insufficient documentation

## 2017-10-21 DIAGNOSIS — Z79899 Other long term (current) drug therapy: Secondary | ICD-10-CM | POA: Insufficient documentation

## 2017-10-21 DIAGNOSIS — M109 Gout, unspecified: Secondary | ICD-10-CM | POA: Diagnosis not present

## 2017-10-21 DIAGNOSIS — Z96652 Presence of left artificial knee joint: Secondary | ICD-10-CM | POA: Diagnosis not present

## 2017-10-21 DIAGNOSIS — E1122 Type 2 diabetes mellitus with diabetic chronic kidney disease: Secondary | ICD-10-CM | POA: Insufficient documentation

## 2017-10-21 DIAGNOSIS — X58XXXA Exposure to other specified factors, initial encounter: Secondary | ICD-10-CM | POA: Insufficient documentation

## 2017-10-21 DIAGNOSIS — K922 Gastrointestinal hemorrhage, unspecified: Secondary | ICD-10-CM | POA: Diagnosis present

## 2017-10-21 DIAGNOSIS — K449 Diaphragmatic hernia without obstruction or gangrene: Secondary | ICD-10-CM | POA: Insufficient documentation

## 2017-10-21 DIAGNOSIS — K2980 Duodenitis without bleeding: Secondary | ICD-10-CM | POA: Insufficient documentation

## 2017-10-21 DIAGNOSIS — R110 Nausea: Secondary | ICD-10-CM | POA: Diagnosis not present

## 2017-10-21 DIAGNOSIS — Z85828 Personal history of other malignant neoplasm of skin: Secondary | ICD-10-CM | POA: Diagnosis not present

## 2017-10-21 DIAGNOSIS — Z8719 Personal history of other diseases of the digestive system: Secondary | ICD-10-CM | POA: Insufficient documentation

## 2017-10-21 DIAGNOSIS — Z7984 Long term (current) use of oral hypoglycemic drugs: Secondary | ICD-10-CM | POA: Diagnosis not present

## 2017-10-21 DIAGNOSIS — K2210 Ulcer of esophagus without bleeding: Secondary | ICD-10-CM | POA: Diagnosis not present

## 2017-10-21 DIAGNOSIS — Z87891 Personal history of nicotine dependence: Secondary | ICD-10-CM | POA: Diagnosis not present

## 2017-10-21 DIAGNOSIS — I10 Essential (primary) hypertension: Secondary | ICD-10-CM | POA: Diagnosis not present

## 2017-10-21 DIAGNOSIS — I130 Hypertensive heart and chronic kidney disease with heart failure and stage 1 through stage 4 chronic kidney disease, or unspecified chronic kidney disease: Secondary | ICD-10-CM | POA: Insufficient documentation

## 2017-10-21 DIAGNOSIS — N182 Chronic kidney disease, stage 2 (mild): Secondary | ICD-10-CM | POA: Diagnosis not present

## 2017-10-21 DIAGNOSIS — E119 Type 2 diabetes mellitus without complications: Secondary | ICD-10-CM | POA: Diagnosis not present

## 2017-10-21 HISTORY — DX: Peptic ulcer, site unspecified, unspecified as acute or chronic, without hemorrhage or perforation: K27.9

## 2017-10-21 HISTORY — DX: Duodenal ulcer, unspecified as acute or chronic, without hemorrhage or perforation: K26.9

## 2017-10-21 HISTORY — DX: Esophagitis, unspecified: K20.9

## 2017-10-21 HISTORY — DX: Gastritis, unspecified, without bleeding: K29.70

## 2017-10-21 HISTORY — DX: Ulcer of esophagus without bleeding: K22.10

## 2017-10-21 HISTORY — DX: Esophagitis, unspecified without bleeding: K20.90

## 2017-10-21 HISTORY — DX: Gastro-esophageal reflux disease without esophagitis: K21.9

## 2017-10-21 LAB — I-STAT CHEM 8, ED
BUN: 36 mg/dL — ABNORMAL HIGH (ref 6–20)
CREATININE: 1.5 mg/dL — AB (ref 0.61–1.24)
Calcium, Ion: 1.17 mmol/L (ref 1.15–1.40)
Chloride: 106 mmol/L (ref 101–111)
GLUCOSE: 163 mg/dL — AB (ref 65–99)
HCT: 38 % — ABNORMAL LOW (ref 39.0–52.0)
HEMOGLOBIN: 12.9 g/dL — AB (ref 13.0–17.0)
Potassium: 4.1 mmol/L (ref 3.5–5.1)
Sodium: 139 mmol/L (ref 135–145)
TCO2: 24 mmol/L (ref 22–32)

## 2017-10-21 LAB — CBC WITH DIFFERENTIAL/PLATELET
BASOS ABS: 0 10*3/uL (ref 0.0–0.1)
Basophils Relative: 0 %
EOS ABS: 0.4 10*3/uL (ref 0.0–0.7)
EOS PCT: 3 %
HCT: 39.9 % (ref 39.0–52.0)
HEMOGLOBIN: 13.2 g/dL (ref 13.0–17.0)
LYMPHS PCT: 10 %
Lymphs Abs: 1.2 10*3/uL (ref 0.7–4.0)
MCH: 30 pg (ref 26.0–34.0)
MCHC: 33.1 g/dL (ref 30.0–36.0)
MCV: 90.7 fL (ref 78.0–100.0)
Monocytes Absolute: 1.2 10*3/uL — ABNORMAL HIGH (ref 0.1–1.0)
Monocytes Relative: 10 %
NEUTROS PCT: 77 %
Neutro Abs: 9.4 10*3/uL — ABNORMAL HIGH (ref 1.7–7.7)
PLATELETS: 211 10*3/uL (ref 150–400)
RBC: 4.4 MIL/uL (ref 4.22–5.81)
RDW: 14.1 % (ref 11.5–15.5)
WBC: 12.2 10*3/uL — AB (ref 4.0–10.5)

## 2017-10-21 LAB — TROPONIN I

## 2017-10-21 LAB — COMPREHENSIVE METABOLIC PANEL
ALBUMIN: 4 g/dL (ref 3.5–5.0)
ALK PHOS: 103 U/L (ref 38–126)
ALT: 16 U/L — ABNORMAL LOW (ref 17–63)
ANION GAP: 11 (ref 5–15)
AST: 19 U/L (ref 15–41)
BUN: 40 mg/dL — ABNORMAL HIGH (ref 6–20)
CALCIUM: 9.3 mg/dL (ref 8.9–10.3)
CO2: 22 mmol/L (ref 22–32)
Chloride: 105 mmol/L (ref 101–111)
Creatinine, Ser: 1.5 mg/dL — ABNORMAL HIGH (ref 0.61–1.24)
GFR calc non Af Amer: 43 mL/min — ABNORMAL LOW (ref >60)
GFR, EST AFRICAN AMERICAN: 49 mL/min — AB (ref >60)
GLUCOSE: 162 mg/dL — AB (ref 65–99)
POTASSIUM: 4.1 mmol/L (ref 3.5–5.1)
SODIUM: 138 mmol/L (ref 135–145)
TOTAL PROTEIN: 7.5 g/dL (ref 6.5–8.1)
Total Bilirubin: 0.5 mg/dL (ref 0.3–1.2)

## 2017-10-21 LAB — SAMPLE TO BLOOD BANK

## 2017-10-21 LAB — PROTIME-INR
INR: 0.91
PROTHROMBIN TIME: 12.1 s (ref 11.4–15.2)

## 2017-10-21 LAB — LIPASE, BLOOD: Lipase: 50 U/L (ref 11–51)

## 2017-10-21 MED ORDER — SODIUM CHLORIDE 0.9 % IV SOLN
8.0000 mg/h | INTRAVENOUS | Status: DC
  Administered 2017-10-21: 8 mg/h via INTRAVENOUS
  Filled 2017-10-21 (×7): qty 80

## 2017-10-21 MED ORDER — SODIUM CHLORIDE 0.9 % IV SOLN
80.0000 mg | Freq: Once | INTRAVENOUS | Status: AC
  Administered 2017-10-21: 80 mg via INTRAVENOUS
  Filled 2017-10-21: qty 80

## 2017-10-21 MED ORDER — PANTOPRAZOLE SODIUM 40 MG IV SOLR
40.0000 mg | Freq: Once | INTRAVENOUS | Status: DC
  Filled 2017-10-21: qty 40

## 2017-10-21 MED ORDER — PANTOPRAZOLE SODIUM 40 MG IV SOLR
INTRAVENOUS | Status: AC
Start: 2017-10-21 — End: ?
  Filled 2017-10-21: qty 160
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
## 2017-10-21 NOTE — ED Notes (Signed)
hospitalist at bedside

## 2017-10-21 NOTE — ED Triage Notes (Signed)
Patient states that he was eating this morning, toast and eggs, and felt like food got stuck in esophagus. Patient states "At first I couldn't get it to go up or down, but then I started vomiting blood and it hasn't stopped since. Per patient vomited blood x3 today. Patient states mid-sternal/spigastric pain.
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
## 2017-10-22 ENCOUNTER — Encounter (HOSPITAL_COMMUNITY)
Admission: EM | Disposition: A | Discharge status: Home / Self Care | Payer: Self-pay | Source: Home / Self Care | Attending: Emergency Medicine

## 2017-10-22 ENCOUNTER — Encounter (HOSPITAL_COMMUNITY): Payer: Self-pay | Admitting: *Deleted

## 2017-10-22 DIAGNOSIS — T18108A Unspecified foreign body in esophagus causing other injury, initial encounter: Secondary | ICD-10-CM | POA: Diagnosis not present

## 2017-10-22 DIAGNOSIS — K2980 Duodenitis without bleeding: Secondary | ICD-10-CM | POA: Diagnosis not present

## 2017-10-22 DIAGNOSIS — K2210 Ulcer of esophagus without bleeding: Secondary | ICD-10-CM | POA: Diagnosis not present

## 2017-10-22 DIAGNOSIS — K449 Diaphragmatic hernia without obstruction or gangrene: Secondary | ICD-10-CM

## 2017-10-22 DIAGNOSIS — K922 Gastrointestinal hemorrhage, unspecified: Secondary | ICD-10-CM | POA: Diagnosis not present

## 2017-10-22 DIAGNOSIS — T18128A Food in esophagus causing other injury, initial encounter: Secondary | ICD-10-CM | POA: Diagnosis not present

## 2017-10-22 HISTORY — PX: ESOPHAGOGASTRODUODENOSCOPY: SHX5428

## 2017-10-22 HISTORY — PX: FOREIGN BODY REMOVAL: SHX962

## 2017-10-22 LAB — CBC
HCT: 36.9 % — ABNORMAL LOW (ref 39.0–52.0)
Hemoglobin: 12 g/dL — ABNORMAL LOW (ref 13.0–17.0)
MCH: 29.8 pg (ref 26.0–34.0)
MCHC: 32.5 g/dL (ref 30.0–36.0)
MCV: 91.6 fL (ref 78.0–100.0)
Platelets: 208 10*3/uL (ref 150–400)
RBC: 4.03 MIL/uL — AB (ref 4.22–5.81)
RDW: 14.2 % (ref 11.5–15.5)
WBC: 9.9 10*3/uL (ref 4.0–10.5)

## 2017-10-22 LAB — HEMATOCRIT: HCT: 37.6 % — ABNORMAL LOW (ref 39.0–52.0)

## 2017-10-22 LAB — HEMOGLOBIN: HEMOGLOBIN: 12.4 g/dL — AB (ref 13.0–17.0)

## 2017-10-22 SURGERY — EGD (ESOPHAGOGASTRODUODENOSCOPY)
Anesthesia: Moderate Sedation

## 2017-10-22 MED ORDER — ATORVASTATIN CALCIUM 10 MG PO TABS
10.0000 mg | ORAL_TABLET | Freq: Every day | ORAL | Status: DC
  Administered 2017-10-23: 10 mg via ORAL
  Filled 2017-10-22: qty 1

## 2017-10-22 MED ORDER — ACETAMINOPHEN 650 MG RE SUPP: 650.0000 mg | Freq: Four times a day (QID) | RECTAL | Status: DC | PRN

## 2017-10-22 MED ORDER — SODIUM CHLORIDE 0.9 % IV SOLN
INTRAVENOUS | Status: DC
  Administered 2017-10-22: 125 mL/h via INTRAVENOUS

## 2017-10-22 MED ORDER — AMLODIPINE BESYLATE 5 MG PO TABS
5.0000 mg | ORAL_TABLET | Freq: Every day | ORAL | Status: DC
  Administered 2017-10-23: 5 mg via ORAL
  Filled 2017-10-22: qty 1

## 2017-10-22 MED ORDER — SODIUM CHLORIDE 0.9 % IV SOLN: INTRAVENOUS | Status: DC

## 2017-10-22 MED ORDER — LIDOCAINE VISCOUS 2 % MT SOLN
OROMUCOSAL | Status: DC | PRN
  Administered 2017-10-22: 1 via OROMUCOSAL

## 2017-10-22 MED ORDER — LISINOPRIL 5 MG PO TABS
5.0000 mg | ORAL_TABLET | Freq: Every day | ORAL | Status: DC
  Administered 2017-10-23: 5 mg via ORAL
  Filled 2017-10-22: qty 1

## 2017-10-22 MED ORDER — MIDAZOLAM HCL 5 MG/5ML IJ SOLN
INTRAMUSCULAR | Status: DC | PRN
  Administered 2017-10-22: 1 mg via INTRAVENOUS
  Administered 2017-10-22: 2 mg via INTRAVENOUS

## 2017-10-22 MED ORDER — ONDANSETRON HCL 4 MG PO TABS: 4.0000 mg | ORAL_TABLET | Freq: Four times a day (QID) | ORAL | Status: DC | PRN

## 2017-10-22 MED ORDER — LIDOCAINE VISCOUS 2 % MT SOLN
OROMUCOSAL | Status: AC
  Filled 2017-10-22: qty 15

## 2017-10-22 MED ORDER — ACETAMINOPHEN 325 MG PO TABS
650.0000 mg | ORAL_TABLET | Freq: Four times a day (QID) | ORAL | Status: DC | PRN
  Administered 2017-10-23: 650 mg via ORAL
  Filled 2017-10-22: qty 2

## 2017-10-22 MED ORDER — MIDAZOLAM HCL 5 MG/5ML IJ SOLN
INTRAMUSCULAR | Status: AC
  Filled 2017-10-22: qty 10

## 2017-10-22 MED ORDER — SUCRALFATE 1 GM/10ML PO SUSP
1.0000 g | Freq: Three times a day (TID) | ORAL | Status: DC
  Administered 2017-10-22 – 2017-10-23 (×5): 1 g via ORAL
  Filled 2017-10-22 (×5): qty 10

## 2017-10-22 MED ORDER — ONDANSETRON HCL 4 MG/2ML IJ SOLN
4.0000 mg | Freq: Four times a day (QID) | INTRAMUSCULAR | Status: DC | PRN
  Administered 2017-10-22: 4 mg via INTRAVENOUS
  Filled 2017-10-22: qty 2

## 2017-10-22 MED ORDER — MEPERIDINE HCL 50 MG/ML IJ SOLN
INTRAMUSCULAR | Status: DC | PRN
  Administered 2017-10-22 (×2): 25 mg via INTRAVENOUS

## 2017-10-22 MED ORDER — FUROSEMIDE 20 MG PO TABS
20.0000 mg | ORAL_TABLET | Freq: Every day | ORAL | Status: DC
  Administered 2017-10-23: 20 mg via ORAL
  Filled 2017-10-22: qty 1

## 2017-10-22 MED ORDER — MEPERIDINE HCL 50 MG/ML IJ SOLN
INTRAMUSCULAR | Status: AC
  Filled 2017-10-22: qty 1

## 2017-10-22 NOTE — Progress Notes (Signed)
PROGRESS NOTE    [name]  AYT:[id number] DOB: [birth year] DOA: 10/21/2017 PCP: Biagio Borg, MD  Brief Narrative: [name] is a 81 y.o. male with a history of DM2, GERD, HTN, hyperlipidemia, h/o esophageal ulcer/gastritis, was admitted yesterday following an episode of choking, and hemetemesis. Hb stable. GI consulted, EGD today  Assessment & Plan:     Large esophageal ulcer with adherent clot and foreign body in distal esophagus - Continue IV PPI today  - appreciate gastroenterology consult, endoscopy this morning with the above findings - continue liquid diet  -Monitor CBC, advance diet as tolerated -FU Hpylori  Diabetes mellitus -Hold Glucotrol, sliding scale insulin   Hypertension -Continue lisinopril and amlodipine  Dyslipidemia -Continue statin  [name] 2 -stable at baseline, continue lisinopril  DVT prophylaxis: SCDs  Code Status:  full code  Family Communication: daughter at bedside  Disposition Plan:  home in 2-3 days   Consultants:   Gastroenterology  Procedures: EGD: Large esophageal ulcer mid/distal esophagus with adherent clot. Foreign body in the distal esophagus.  Removed with rescue net. Small sliding hiatal hernia. Duodenitis with a scar.     Antimicrobials:    Subjective: -feels better now, after endoscopy  Objective: Vitals:   10/22/17 0940 10/22/17 0945 10/22/17 1011 10/22/17 1047  BP:   (!) 141/89   Pulse:   69   Resp:   18   Temp:      TempSrc:      SpO2: 100% 100% 99% 96%  Weight:      Height:        Intake/Output Summary (Last 24 hours) at 10/22/2017 1100 Last data filed at 10/22/2017 0800 Gross per 24 hour  Intake 914.58 ml  Output -  Net 914.58 ml   Filed Weights   10/21/17 1708 10/21/17 2200  Weight: 117 kg (258 lb) 108.5 kg (239 lb 3.2 oz)    Examination:  General exam: Appears calm and comfortable  Respiratory system: Clear to auscultation. Respiratory effort normal. Cardiovascular system: S1 &  S2 heard, RRR. No JVD, murmurs, rubs, gallops  Gastrointestinal system: Abdomen is nondistended, soft and nontender.Normal bowel sounds heard. Central nervous system: Alert and oriented. No focal neurological deficits. Extremities: Symmetric 5 x 5 power. Skin: No rashes, lesions or ulcers Psychiatry: Judgement and insight appear normal. Mood & affect appropriate.     Data Reviewed:   CBC: Recent Labs  Lab 10/21/17 1734 10/22/17 0057 10/22/17 0557  WBC 12.2*  --  9.9  NEUTROABS 9.4*  --   --   HGB 13.2 12.4* 12.0*  HCT 39.9 37.6* 36.9*  MCV 90.7  --  91.6  PLT 211  --  355   Basic Metabolic Panel: Recent Labs  Lab 10/21/17 1734  NA 138  K 4.1  CL 105  CO2 22  GLUCOSE 162*  BUN 40*  CREATININE 1.50*  CALCIUM 9.3   GFR: Estimated Creatinine Clearance: 52.3 mL/min (A) (by C-G formula based on SCr of 1.5 mg/dL (H)). Liver Function Tests: Recent Labs  Lab 10/21/17 1734  AST 19  ALT 16*  ALKPHOS 103  BILITOT 0.5  PROT 7.5  ALBUMIN 4.0   Recent Labs  Lab 10/21/17 1734  LIPASE 50   No results for input(s): AMMONIA in the last 168 hours. Coagulation Profile: Recent Labs  Lab 10/21/17 1734  INR 0.91   Cardiac Enzymes: Recent Labs  Lab 10/21/17 1734  TROPONINI <0.03   BNP (last 3 results) No results for input(s): PROBNP in  the last 8760 hours. HbA1C: No results for input(s): HGBA1C in the last 72 hours. CBG: No results for input(s): GLUCAP in the last 168 hours. Lipid Profile: No results for input(s): CHOL, HDL, LDLCALC, TRIG, CHOLHDL, LDLDIRECT in the last 72 hours. Thyroid Function Tests: No results for input(s): TSH, T4TOTAL, FREET4, T3FREE, THYROIDAB in the last 72 hours. Anemia Panel: No results for input(s): VITAMINB12, FOLATE, FERRITIN, TIBC, IRON, RETICCTPCT in the last 72 hours. Urine analysis:    Component Value Date/Time   COLORURINE YELLOW 03/21/2017 0830   APPEARANCEUR CLEAR 03/21/2017 0830   LABSPEC 1.020 03/21/2017 0830    PHURINE 5.5 03/21/2017 0830   GLUCOSEU NEGATIVE 03/21/2017 0830   HGBUR NEGATIVE 03/21/2017 0830   BILIRUBINUR NEGATIVE 03/21/2017 0830   KETONESUR NEGATIVE 03/21/2017 0830   PROTEINUR NEGATIVE 08/27/2015 1007   UROBILINOGEN 0.2 03/21/2017 0830   NITRITE NEGATIVE 03/21/2017 0830   LEUKOCYTESUR NEGATIVE 03/21/2017 0830   Sepsis Labs: @LABRCNTIP (procalcitonin:4,lacticidven:4)  )No results found for this or any previous visit (from the past 240 hour(s)).       Radiology Studies: Dg Abd Acute W/chest  Result Date: 10/21/2017 CLINICAL DATA:  Hematemesis. EXAM: DG ABDOMEN ACUTE W/ 1V CHEST COMPARISON:  Chest x-ray August 27, 2015 FINDINGS: The heart size is normal. The hila are symmetric. There is a torturous thoracic aorta. The mediastinum is otherwise normal. No pneumothorax. No pulmonary nodules or masses. No focal infiltrates. No free air, portal venous gas, pneumatosis. No bowel obstruction identified. No acute abnormalities are noted. IMPRESSION: No acute abnormalities identified. Electronically Signed   By: Dorise Bullion III M.D   On: 10/21/2017 19:13        Scheduled Meds: . amLODipine  5 mg Oral Daily  . atorvastatin  10 mg Oral Daily  . furosemide  20 mg Oral Daily  . lisinopril  5 mg Oral Daily  . sucralfate  1 g Oral TID WC & HS   Continuous Infusions: . sodium chloride 125 mL/hr at 10/22/17 0500  . pantoprozole (PROTONIX) infusion 8 mg/hr (10/22/17 0500)     LOS: 0 days    Time spent: 54min    Domenic Polite, MD [facility] Page via [email or web address], password [email or web address] After 7PM please contact night-coverage  10/22/2017, 11:00 AM
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
## 2017-10-22 NOTE — Op Note (Signed)
[facility] Patient Name: [name] Procedure Date: 10/22/2017 9:11 AM MRN: [id number] Date of Birth: 02-18-1936 Attending MD: Hildred Laser , MD CSN: [id number] Age: 81 Admit Type: Inpatient Procedure:                Upper GI endoscopy Indications:              Foreign body in the esophagus, Coffee-ground emesis Providers:                Hildred Laser, MD, Janeece Riggers, RN, Charlsie Quest.                            Theda Sers RN, RN, Hinton Rao, RN Referring MD:             Loma Boston, DO Medicines:                Lidocaine spray, Meperidine 50 mg IV, Midazolam 4                            mg IV Complications:            No immediate complications. Estimated Blood Loss:     Estimated blood loss: none. Procedure:                Pre-Anesthesia Assessment:                           - Prior to the procedure, a History and Physical                            was performed, and patient medications and                            allergies were reviewed. The patient's tolerance of                            previous anesthesia was also reviewed. The risks                            and benefits of the procedure and the sedation                            options and risks were discussed with the patient.                            All questions were answered, and informed consent                            was obtained. Prior Anticoagulants: The patient has                            taken no previous anticoagulant or antiplatelet                            agents. ASA Grade Assessment: III - A patient with  severe systemic disease. After reviewing the risks                            and benefits, the patient was deemed in                            satisfactory condition to undergo the procedure.                           After obtaining informed consent, the endoscope was                            passed under direct vision. Throughout the               procedure, the patient's blood pressure, pulse, and                            oxygen saturations were monitored continuously. The                            [id number] ([id number]) scope was introduced through the                            mouth, and advanced to the second part of duodenum.                            The upper GI endoscopy was accomplished without                            difficulty. The patient tolerated the procedure                            well. Scope In: 9:24:51 AM Scope Out: 9:36:07 AM Total Procedure Duration: 0 hours 11 minutes 16 seconds  Findings:      The proximal esophagus was normal.      One linear esophageal ulcer with no bleeding and stigmata of recent       bleeding was found 34 to 36 cm from the incisors. The lesion was 6 mm in       largest dimension.      Food was found in the distal esophagus. Removal of food was accomplished.      The Z-line was regular and was found 39 cm from the incisors.      A 3 cm hiatal hernia was present.      The entire examined stomach was normal.      Diffuse moderate inflammation characterized by congestion (edema),       erosions and erythema was found in the duodenal bulb.      The second portion of the duodenum was normal. Impression:               - Normal proximal esophagus.                           - Non-bleeding esophageal ulcer.                           -  Food in the distal esophagus. Removal was                            successful.                           - Z-line regular, 39 cm from the incisors.                           - 3 cm hiatal hernia.                           - Normal stomach.                           - Duodenitis.                           - Normal second portion of the duodenum. Moderate Sedation:      Moderate (conscious) sedation was administered by the endoscopy nurse       and supervised by the endoscopist. The following parameters were       monitored: oxygen  saturation, heart rate, blood pressure, CO2       capnography and response to care. Total physician intraservice time was       16 minutes. Recommendation:           - Return patient to hospital ward for ongoing care.                           - Clear liquid diet today.                           - Continue present medications.                           - No aspirin, ibuprofen, naproxen, or other                            non-steroidal anti-inflammatory drugs for 2 weeks.                           - Continue Pantoprazole infusion for 48 hours.                           - Sucralfate 1 gm po ac and qhs.                           - H. Pylori serology. Procedure Code(s):        --- Professional ---                           [phone], Esophagogastroduodenoscopy, flexible,                            transoral; with removal of foreign body(s)  [id number], Moderate sedation services provided by the                            same physician or other qualified health care                            professional performing the diagnostic or                            therapeutic service that the sedation supports,                            requiring the presence of an independent trained                            observer to assist in the monitoring of the                            patient's level of consciousness and physiological                            status; initial 15 minutes of intraservice time,                            patient age 44 years or older Diagnosis Code(s):        --- Professional ---                           K22.10, Ulcer of esophagus without bleeding                           T18.128A, Food in esophagus causing other injury,                            initial encounter                           K44.9, Diaphragmatic hernia without obstruction or                            gangrene                           K29.80, Duodenitis without bleeding                            T18.108A, Unspecified foreign body in esophagus                            causing other injury, initial encounter                           K92.0, Hematemesis CPT copyright 2016 American Medical [employer]. All rights reserved. The codes documented in this report are preliminary and upon coder review may  be revised to meet current compliance requirements. Hildred Laser, MD Hildred Laser, MD  10/22/2017 9:56:37 AM This report has been signed electronically. Number of Addenda: 0

## 2017-10-22 NOTE — Progress Notes (Signed)
Brief EGD note:   Large esophageal ulcer mid/distal esophagus with adherent clot. Foreign body in the distal esophagus.  Removed with rescue net. Small sliding hiatal hernia. Duodenitis with a scar.

## 2017-10-22 NOTE — Progress Notes (Signed)
Patient is not having any difficulty with clear liquids.  He denies chest pain nausea or vomiting. Advance diet to full liquids tomorrow morning.

## 2017-10-23 ENCOUNTER — Telehealth: Payer: Self-pay | Admitting: Gastroenterology

## 2017-10-23 DIAGNOSIS — K922 Gastrointestinal hemorrhage, unspecified: Secondary | ICD-10-CM

## 2017-10-23 LAB — BASIC METABOLIC PANEL
Anion gap: 7 (ref 5–15)
BUN: 19 mg/dL (ref 6–20)
CO2: 25 mmol/L (ref 22–32)
CREATININE: 1.08 mg/dL (ref 0.61–1.24)
Calcium: 8.9 mg/dL (ref 8.9–10.3)
Chloride: 104 mmol/L (ref 101–111)
GFR calc Af Amer: >60 mL/min (ref >60)
GLUCOSE: 147 mg/dL — AB (ref 65–99)
Potassium: 4.2 mmol/L (ref 3.5–5.1)
SODIUM: 136 mmol/L (ref 135–145)

## 2017-10-23 LAB — CBC
HCT: 36.8 % — ABNORMAL LOW (ref 39.0–52.0)
Hemoglobin: 11.9 g/dL — ABNORMAL LOW (ref 13.0–17.0)
MCH: 29.9 pg (ref 26.0–34.0)
MCHC: 32.3 g/dL (ref 30.0–36.0)
MCV: 92.5 fL (ref 78.0–100.0)
PLATELETS: 186 10*3/uL (ref 150–400)
RBC: 3.98 MIL/uL — ABNORMAL LOW (ref 4.22–5.81)
RDW: 14.3 % (ref 11.5–15.5)
WBC: 8 10*3/uL (ref 4.0–10.5)

## 2017-10-23 MED ORDER — PANTOPRAZOLE SODIUM 40 MG PO TBEC
40.0000 mg | DELAYED_RELEASE_TABLET | Freq: Two times a day (BID) | ORAL | Status: DC
  Administered 2017-10-23: 40 mg via ORAL

## 2017-10-23 MED ORDER — SUCRALFATE 1 GM/10ML PO SUSP: 1.0000 g | Freq: Three times a day (TID) | ORAL | 0 refills | Status: DC

## 2017-10-23 MED ORDER — PANTOPRAZOLE SODIUM 40 MG PO TBEC
40.0000 mg | DELAYED_RELEASE_TABLET | Freq: Two times a day (BID) | ORAL | Status: DC
  Administered 2017-10-23: 40 mg via ORAL
  Filled 2017-10-23: qty 1

## 2017-10-23 MED ORDER — PANTOPRAZOLE SODIUM 40 MG PO TBEC: 40.0000 mg | DELAYED_RELEASE_TABLET | Freq: Two times a day (BID) | ORAL | 0 refills | Status: DC

## 2017-10-23 NOTE — Progress Notes (Signed)
Pt d/c home with spouse at bedside.  All F/U information reviewed.  States will f/u with PCP 2 weeks after d/c.  PCP: Dr. Ina Kick with [facility].  Will pick up medications at pharmacy.  D/C in stable condition.

## 2017-10-23 NOTE — Progress Notes (Signed)
REVIEWED-NO ADDITIONAL RECOMMENDATIONS.   Subjective: No overt GI bleeding. Tolerating diet. No abdominal pain, N/V. Wants to go home. Denies dysphagia.   Objective: Vital signs in last 24 hours: Temp:  [97.9 F (36.6 C)-98.9 F (37.2 C)] 98.9 F (37.2 C) (11/11 2111) Pulse Rate:  [62-97] 64 (11/11 2111) Resp:  [11-20] 20 (11/11 2111) BP: (127-155)/(76-95) 137/85 (11/11 2111) SpO2:  [96 %-100 %] 97 % (11/11 2111) Last BM Date: 10/21/17 General:   Alert and oriented, pleasant Head:  Normocephalic and atraumatic. Abdomen:  Bowel sounds present, soft, non-tender, non-distended. No HSM or hernias noted. No rebound or guarding.  Extremities:  Without edema. Neurologic:  Alert and  oriented x4 Psych:  Alert and cooperative. Normal mood and affect.  Intake/Output from previous day: 11/11 0701 - 11/12 0700 In: 2445 [P.O.:270; I.V.:2175] Out: -  Intake/Output this shift: No intake/output data recorded.  Lab Results: Recent Labs    10/21/17 1734 10/22/17 0057 10/22/17 0557 10/23/17 0552  WBC 12.2*  --  9.9 8.0  HGB 13.2 12.4* 12.0* 11.9*  HCT 39.9 37.6* 36.9* 36.8*  PLT 211  --  208 186   BMET Recent Labs    10/21/17 1734 10/23/17 0552  NA 138 136  K 4.1 4.2  CL 105 104  CO2 22 25  GLUCOSE 162* 147*  BUN 40* 19  CREATININE 1.50* 1.08  CALCIUM 9.3 8.9   LFT Recent Labs    10/21/17 1734  PROT 7.5  ALBUMIN 4.0  AST 19  ALT 16*  ALKPHOS 103  BILITOT 0.5   PT/INR Recent Labs    10/21/17 1734  LABPROT 12.1  INR 0.91    Studies/Results: Dg Abd Acute W/chest  Result Date: 10/21/2017 CLINICAL DATA:  Hematemesis. EXAM: DG ABDOMEN ACUTE W/ 1V CHEST COMPARISON:  Chest x-ray August 27, 2015 FINDINGS: The heart size is normal. The hila are symmetric. There is a torturous thoracic aorta. The mediastinum is otherwise normal. No pneumothorax. No pulmonary nodules or masses. No focal infiltrates. No free air, portal venous gas, pneumatosis. No bowel obstruction  identified. No acute abnormalities are noted. IMPRESSION: No acute abnormalities identified. Electronically Signed   By: Dorise Bullion III M.D   On: 10/21/2017 19:13    Assessment: 81 year old male with history of duodenal ulcer in August 2017, reflux esophagitis, presenting with food impaction, N/V, and hematemesis. EGD completed yesterday with normal proximal esophagus, non-bleeding esophageal ulcer, food in distal esophagus s/p disimpaction, normal stomach, and duodenitis.     Plan: BID PPI, 30 minutes before breakfast and dinner  Full liquids, advancing to dysphagia 3 as tolerated Follow-up as outpatient with consideration for BPE if concern for motility disorder H.pylori serology as recommended by Dr. Laural Golden [place] discharge in near future   Annitta Needs, PhD, ANP-BC [facility]     LOS: 0 days    10/23/2017, 8:05 AM

## 2017-10-23 NOTE — Discharge Summary (Signed)
Physician Discharge Summary  [name] GEX:[id number] DOB: [birth year] DOA: 10/21/2017  PCP: Biagio Borg, MD  Admit date: 10/21/2017 Discharge date: 10/23/2017  Time spent: >35 minutes  Recommendations for Outpatient Follow-up:  F/u with GI in 2-4 weeks PCP in 3-7 days as needed   Discharge Diagnoses:  Principal Problem:   Upper GI bleeding Active Problems:   Diabetes ([place])   Hyperlipidemia   Essential hypertension   Discharge Condition: stable   Diet recommendation: low carb. Low sodium   Filed Weights   10/21/17 1708 10/21/17 2200  Weight: 117 kg (258 lb) 108.5 kg (239 lb 3.2 oz)    History of present illness:   81 y.o.malewith a history of DM2, GERD, HTN, hyperlipidemia, h/o esophageal ulcer/gastritis, was admitted yesterday following an episode of choking, and hemetemesis. Hb stable. GI consulted, underwent EGD    Hospital Course:   Large esophageal ulcer with adherent clot and foreign body in distal esophagus. Received IV PPI , transitioned to oral regimen, Carafate. No s/s of acute bleeding. recommended to f/u with GI as outpatient. Repeat EGD to document healing    Diabetes mellitus. stable. Resumed home regimen    Hypertension. Continue lisinopril and amlodipine  Dyslipidemia. Continue statin  [name] 2. stable at baseline, continue lisinopril. Recheck labs in 1 week     Procedures:  Colonoscopy  (i.e. Studies not automatically included, echos, thoracentesis, etc; not x-rays)  Consultations:  GI  Discharge Exam: Vitals:   10/22/17 2111 10/23/17 1400  BP: 137/85 (!) 152/91  Pulse: 64 77  Resp: 20 16  Temp: 98.9 F (37.2 C) 97.6 F (36.4 C)  SpO2: 97% 100%    General: alert. No distress  Cardiovascular: s1,s2 rrr Respiratory: CTA BL  Discharge Instructions  Discharge Instructions    Diet - low sodium heart healthy   Complete by:  As directed    Increase activity slowly   Complete by:  As directed      Allergies as of  10/23/2017      Reactions   Labetalol Hcl Swelling   REACTION: Throat itching and swelling   Shellfish Allergy Swelling   Swelling of elbow only, hasn't eaten shrimp since that event          Medication List    TAKE these medications   amLODipine 10 MG tablet Commonly known as:  NORVASC Take 1 tablet (10 mg total) by mouth daily. What changed:  how much to take   atorvastatin 20 MG tablet Commonly known as:  LIPITOR Take 1 tablet (20 mg total) by mouth daily. What changed:  how much to take   furosemide 40 MG tablet Commonly known as:  LASIX Take 1 tablet (40 mg total) by mouth daily. What changed:  how much to take   glipiZIDE 10 MG 24 hr tablet Commonly known as:  GLUCOTROL XL TAKE 1 TABLET BY MOUTH EVERY DAY WITH BREAKFAST   glucose blood test strip Commonly known as:  ONE TOUCH ULTRA TEST 1 each by Other route as needed for other. Use to check blood sugars twice a day  E11.9   lisinopril 10 MG tablet Commonly known as:  PRINIVIL,ZESTRIL TAKE 1 TABLET (10 MG TOTAL) BY MOUTH DAILY. What changed:  how much to take   pantoprazole 40 MG tablet Commonly known as:  PROTONIX Take 1 tablet (40 mg total) 2 (two) times daily by mouth.   sucralfate 1 GM/10ML suspension Commonly known as:  CARAFATE Take 10 mLs (1 g total) 4 (four)  times daily -  with meals and at bedtime by mouth.      Allergies  Allergen Reactions  . Labetalol Hcl Swelling    REACTION: Throat itching and swelling  . Shellfish Allergy Swelling    Swelling of elbow only, hasn't eaten shrimp since that event          The results of significant diagnostics from this hospitalization (including imaging, microbiology, ancillary and laboratory) are listed below for reference.    Significant Diagnostic Studies: Dg Abd Acute W/chest  Result Date: 10/21/2017 CLINICAL DATA:  Hematemesis. EXAM: DG ABDOMEN ACUTE W/ 1V CHEST COMPARISON:  Chest x-ray August 27, 2015 FINDINGS: The heart size is normal. The  hila are symmetric. There is a torturous thoracic aorta. The mediastinum is otherwise normal. No pneumothorax. No pulmonary nodules or masses. No focal infiltrates. No free air, portal venous gas, pneumatosis. No bowel obstruction identified. No acute abnormalities are noted. IMPRESSION: No acute abnormalities identified. Electronically Signed   By: Dorise Bullion III M.D   On: 10/21/2017 19:13    Microbiology: No results found for this or any previous visit (from the past 240 hour(s)).   Labs: Basic Metabolic Panel: Recent Labs  Lab 10/21/17 1734 10/23/17 0552  NA 138 136  K 4.1 4.2  CL 105 104  CO2 22 25  GLUCOSE 162* 147*  BUN 40* 19  CREATININE 1.50* 1.08  CALCIUM 9.3 8.9   Liver Function Tests: Recent Labs  Lab 10/21/17 1734  AST 19  ALT 16*  ALKPHOS 103  BILITOT 0.5  PROT 7.5  ALBUMIN 4.0   Recent Labs  Lab 10/21/17 1734  LIPASE 50   No results for input(s): AMMONIA in the last 168 hours. CBC: Recent Labs  Lab 10/21/17 1734 10/22/17 0057 10/22/17 0557 10/23/17 0552  WBC 12.2*  --  9.9 8.0  NEUTROABS 9.4*  --   --   --   HGB 13.2 12.4* 12.0* 11.9*  HCT 39.9 37.6* 36.9* 36.8*  MCV 90.7  --  91.6 92.5  PLT 211  --  208 186   Cardiac Enzymes: Recent Labs  Lab 10/21/17 1734  TROPONINI <0.03   BNP: BNP (last 3 results) No results for input(s): BNP in the last 8760 hours.  ProBNP (last 3 results) No results for input(s): PROBNP in the last 8760 hours.  CBG: No results for input(s): GLUCAP in the last 168 hours.     SignedKinnie Feil  [facility] 10/23/2017, 2:27 PM

## 2017-10-23 NOTE — Progress Notes (Signed)
[facility] PROGRESS NOTE  [name] YTK:[id number] DOB: 1936-05-10 DOA: 10/21/2017 PCP: Biagio Borg, MD  Brief summary   81 y.o.malewith a history of DM2, GERD, HTN, hyperlipidemia, h/o esophageal ulcer/gastritis, was admitted yesterday following an episode of choking, and hemetemesis. Hb stable. GI consulted, underwent EGD    Assessment/Plan:  Large esophageal ulcer with adherent clot and foreign body in distal esophagus. Received IV PPI , transition to oral regimen, Carafate. No s/s of acute bleeding. recommended to f/u with GI as outpatient   Diabetes mellitus. Hold Glucotrol, sliding scale insulin   Hypertension. Continue lisinopril and amlodipine  Dyslipidemia. Continue statin  [name] 2. stable at baseline, continue lisinopril   Code Status: full Family Communication: d/w patient, family (indicate person spoken with, relationship, and if by phone, the number) Disposition Plan: home 24-489 hrs    Consultants:  GI  Procedures: EGD: Large esophageal ulcer mid/distal esophagus with adherent clot. Foreign body in the distal esophagus.Removed with rescue net. Small sliding hiatal hernia. Duodenitis with a scar.     Antibiotics: Anti-infectives (From admission, onward)   None        (indicate start date, and stop date if known)  HPI/Subjective: Alert. Denies acute abdominal pains, no vomiting, no bleeding.   Objective: Vitals:   10/22/17 2006 10/22/17 2111  BP: (!) 153/80 137/85  Pulse: 97 64  Resp:  20  Temp: 98.9 F (37.2 C) 98.9 F (37.2 C)  SpO2: 97% 97%    Intake/Output Summary (Last 24 hours) at 10/23/2017 0928 Last data filed at 10/23/2017 5573 Gross per 24 hour  Intake 2685 ml  Output -  Net 2685 ml   Filed Weights   10/21/17 1708 10/21/17 2200  Weight: 117 kg (258 lb) 108.5 kg (239 lb 3.2 oz)    Exam:   General:  No distress   Cardiovascular: s1,s2 rrr  Respiratory: CTA BL  Abdomen: soft, nt, nd    Musculoskeletal: no leg edema    Data Reviewed: Basic Metabolic Panel: Recent Labs  Lab 10/21/17 1734 10/23/17 0552  NA 138 136  K 4.1 4.2  CL 105 104  CO2 22 25  GLUCOSE 162* 147*  BUN 40* 19  CREATININE 1.50* 1.08  CALCIUM 9.3 8.9   Liver Function Tests: Recent Labs  Lab 10/21/17 1734  AST 19  ALT 16*  ALKPHOS 103  BILITOT 0.5  PROT 7.5  ALBUMIN 4.0   Recent Labs  Lab 10/21/17 1734  LIPASE 50   No results for input(s): AMMONIA in the last 168 hours. CBC: Recent Labs  Lab 10/21/17 1734 10/22/17 0057 10/22/17 0557 10/23/17 0552  WBC 12.2*  --  9.9 8.0  NEUTROABS 9.4*  --   --   --   HGB 13.2 12.4* 12.0* 11.9*  HCT 39.9 37.6* 36.9* 36.8*  MCV 90.7  --  91.6 92.5  PLT 211  --  208 186   Cardiac Enzymes: Recent Labs  Lab 10/21/17 1734  TROPONINI <0.03   BNP (last 3 results) No results for input(s): BNP in the last 8760 hours.  ProBNP (last 3 results) No results for input(s): PROBNP in the last 8760 hours.  CBG: No results for input(s): GLUCAP in the last 168 hours.  No results found for this or any previous visit (from the past 240 hour(s)).   Studies: Dg Abd Acute W/chest  Result Date: 10/21/2017 CLINICAL DATA:  Hematemesis. EXAM: DG ABDOMEN ACUTE W/ 1V CHEST COMPARISON:  Chest x-ray August 27, 2015 FINDINGS: The  heart size is normal. The hila are symmetric. There is a torturous thoracic aorta. The mediastinum is otherwise normal. No pneumothorax. No pulmonary nodules or masses. No focal infiltrates. No free air, portal venous gas, pneumatosis. No bowel obstruction identified. No acute abnormalities are noted. IMPRESSION: No acute abnormalities identified. Electronically Signed   By: Dorise Bullion III M.D   On: 10/21/2017 19:13    Scheduled Meds: . amLODipine  5 mg Oral Daily  . atorvastatin  10 mg Oral Daily  . furosemide  20 mg Oral Daily  . lisinopril  5 mg Oral Daily  . sucralfate  1 g Oral TID WC & HS   Continuous  Infusions: . pantoprozole (PROTONIX) infusion 8 mg/hr (10/22/17 0500)    Principal Problem:   Upper GI bleeding Active Problems:   Diabetes ([place])   Hyperlipidemia   Essential hypertension    Time spent: >35 minutes     Kinnie Feil  [facility] Pager [phone]. If 7PM-7AM, please contact night-coverage at [email or web address], password [facility] 10/23/2017, 9:28 AM  LOS: 0 days

## 2017-10-23 NOTE — Telephone Encounter (Signed)
Please have patient follow-up with [place] in 4-6 weeks, hospital follow-up.

## 2017-10-24 ENCOUNTER — Telehealth: Payer: Self-pay | Admitting: *Deleted

## 2017-10-24 ENCOUNTER — Ambulatory Visit (INDEPENDENT_AMBULATORY_CARE_PROVIDER_SITE_OTHER): Payer: Medicare Other

## 2017-10-24 ENCOUNTER — Other Ambulatory Visit (INDEPENDENT_AMBULATORY_CARE_PROVIDER_SITE_OTHER): Payer: Self-pay

## 2017-10-24 ENCOUNTER — Encounter (INDEPENDENT_AMBULATORY_CARE_PROVIDER_SITE_OTHER): Payer: Self-pay | Admitting: Orthopaedic Surgery

## 2017-10-24 ENCOUNTER — Encounter: Payer: Self-pay | Admitting: Gastroenterology

## 2017-10-24 ENCOUNTER — Ambulatory Visit (INDEPENDENT_AMBULATORY_CARE_PROVIDER_SITE_OTHER): Payer: Medicare Other | Admitting: Orthopaedic Surgery

## 2017-10-24 VITALS — BP 131/79 | HR 80 | Resp 14 | Ht 73.0 in | Wt 232.0 lb

## 2017-10-24 DIAGNOSIS — M25521 Pain in right elbow: Secondary | ICD-10-CM

## 2017-10-24 LAB — H. PYLORI ANTIBODY, IGG

## 2017-10-24 MED ORDER — LIDOCAINE HCL 1 % IJ SOLN
2.0000 mL | INTRAMUSCULAR | Status: AC | PRN
  Administered 2017-10-24: 2 mL

## 2017-10-24 MED ORDER — COLCHICINE 0.6 MG PO TABS: 0.6000 mg | ORAL_TABLET | Freq: Two times a day (BID) | ORAL | 0 refills | Status: DC

## 2017-10-24 MED ORDER — METHYLPREDNISOLONE ACETATE 40 MG/ML IJ SUSP
40.0000 mg | INTRAMUSCULAR | Status: AC | PRN
  Administered 2017-10-24: 40 mg via INTRA_ARTICULAR

## 2017-10-24 NOTE — Telephone Encounter (Signed)
PATIENT SCHEDULED  °

## 2017-10-24 NOTE — Telephone Encounter (Signed)
Transition Care Management Follow-up Telephone Call   Date discharged? 10/23/17   How have you been since you were released from the hospital? Spoke w/wife she states husband doing fine   Do you understand why you were in the hospital? YES   Do you understand the discharge instructions? YES   Where were you discharged to? Home   Items Reviewed:  Medications reviewed: YES  Allergies reviewed: YES  Dietary changes reviewed: YES, low carb  Referrals reviewed: No referral needed   Functional Questionnaire:  Activities of Daily Living (ADLs):   He states he are independent in the following: ambulation, bathing and hygiene, feeding, continence, grooming, toileting and dressing  States he doesn't require assistance    Any transportation issues/concerns?: NO   Any patient concerns? NO   Confirmed importance and date/time of follow-up visits scheduled YES, appt 11/01/17  Provider Appointment booked with Dr. Jenny Reichmann   Confirmed with patient if condition begins to worsen call PCP or go to the ER.  Patient was given the office number and encouraged to call back with question or concerns.  : YES
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
## 2017-10-24 NOTE — Addendum Note (Signed)
Addended by: Mervyn Skeeters on: 10/24/2017 03:24 PM   Modules accepted: Orders

## 2017-10-25 LAB — SYNOVIAL CELL COUNT + DIFF, W/ CRYSTALS
Basophils, %: 0 %
EOSINOPHILS-SYNOVIAL: 0 % (ref 0–2)
Lymphocytes-Synovial Fld: 0 % (ref 0–74)
MONOCYTE/MACROPHAGE: 14 % (ref 0–69)
Neutrophil, Synovial: 86 % — ABNORMAL HIGH (ref 0–24)
Synoviocytes, %: 0 % (ref 0–15)
WBC, SYNOVIAL: 65980 {cells}/uL — AB (ref <150)

## 2017-10-27 ENCOUNTER — Ambulatory Visit (INDEPENDENT_AMBULATORY_CARE_PROVIDER_SITE_OTHER): Payer: Medicare Other | Admitting: Orthopaedic Surgery

## 2017-10-30 LAB — ANAEROBIC AND AEROBIC CULTURE
AER RESULT: NO GROWTH
MICRO NUMBER:: 81280280
MICRO NUMBER:: 81283013
SPECIMEN QUALITY: ADEQUATE
SPECIMEN QUALITY: ADEQUATE

## 2017-11-01 ENCOUNTER — Ambulatory Visit (INDEPENDENT_AMBULATORY_CARE_PROVIDER_SITE_OTHER): Payer: Medicare Other | Admitting: Internal Medicine

## 2017-11-01 ENCOUNTER — Encounter: Payer: Self-pay | Admitting: Internal Medicine

## 2017-11-01 VITALS — BP 128/78 | HR 85 | Temp 98.1°F | Ht 73.0 in | Wt 235.0 lb

## 2017-11-01 DIAGNOSIS — I10 Essential (primary) hypertension: Secondary | ICD-10-CM

## 2017-11-01 DIAGNOSIS — E119 Type 2 diabetes mellitus without complications: Secondary | ICD-10-CM

## 2017-11-01 DIAGNOSIS — Z0000 Encounter for general adult medical examination without abnormal findings: Secondary | ICD-10-CM

## 2017-11-01 DIAGNOSIS — E785 Hyperlipidemia, unspecified: Secondary | ICD-10-CM | POA: Diagnosis not present

## 2017-11-01 DIAGNOSIS — K922 Gastrointestinal hemorrhage, unspecified: Secondary | ICD-10-CM

## 2017-11-01 LAB — POCT GLYCOSYLATED HEMOGLOBIN (HGB A1C): Hemoglobin A1C: 6.8

## 2017-11-01 MED ORDER — GLIPIZIDE ER 10 MG PO TB24: ORAL_TABLET | ORAL | 3 refills | Status: DC

## 2017-11-01 MED ORDER — FUROSEMIDE 40 MG PO TABS: 40.0000 mg | ORAL_TABLET | Freq: Every day | ORAL | 3 refills | Status: DC

## 2017-11-01 MED ORDER — AMLODIPINE BESYLATE 10 MG PO TABS: 10.0000 mg | ORAL_TABLET | Freq: Every day | ORAL | 3 refills | Status: DC

## 2017-11-01 MED ORDER — ATORVASTATIN CALCIUM 20 MG PO TABS: 20.0000 mg | ORAL_TABLET | Freq: Every day | ORAL | 3 refills | Status: DC

## 2017-11-01 NOTE — Patient Instructions (Addendum)
Your A1c was OK  Please continue all other medications as before, and refills have been done if requested.  Please have the pharmacy call with any other refills you may need.  Please continue your efforts at being more active, low cholesterol diabetic diet, and weight control  Please keep your appointments with your specialists as you may have planned  Please return in 6 months, or sooner if needed, with Lab testing done 3-5 days before
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
## 2017-11-02 ENCOUNTER — Encounter: Payer: Self-pay | Admitting: Internal Medicine

## 2017-11-02 NOTE — Assessment & Plan Note (Signed)
Lab Results  Component Value Date   LDLCALC 98 03/21/2017  stable overall by history and exam, recent data reviewed with pt, and pt to continue medical treatment as before,  to f/u any worsening symptoms or concerns

## 2017-11-02 NOTE — Assessment & Plan Note (Signed)
stable overall by history and exam, recent data reviewed with pt, and pt to continue medical treatment as before,  to f/u any worsening symptoms or concerns BP Readings from Last 3 Encounters:  11/01/17 128/78  10/24/17 131/79  10/23/17 (!) 152/91

## 2017-11-02 NOTE — Assessment & Plan Note (Signed)
With recent large esoph ulceration, stable, has planned f/u with GI

## 2017-11-02 NOTE — Assessment & Plan Note (Signed)
Lab Results  Component Value Date   HGBA1C 6.8 11/01/2017  stable overall by history and exam, recent data reviewed with pt, and pt to continue medical treatment as before,  to f/u any worsening symptoms or concerns

## 2017-11-06 NOTE — Progress Notes (Signed)
H.pylori serology negative. This was ordered while in hospital. Will see in routine outpatient follow-up.

## 2017-11-08 ENCOUNTER — Other Ambulatory Visit: Payer: Self-pay | Admitting: Internal Medicine

## 2017-12-14 IMAGING — CT CT ABD-PELV W/ CM
2 of 5 series · 16 of 46 positions shown, 18 images · IV contrast (APPLIED)
Comparison: None.

CLINICAL DATA: Several episodes of rectal bleeding this morning.
Left lower quadrant pain.

EXAM:
CT ABDOMEN AND PELVIS WITH CONTRAST
TECHNIQUE: Multidetector CT imaging of the abdomen and pelvis was performed
using the standard protocol following bolus administration of
intravenous contrast.
CONTRAST:  80mL 7Q8ABQ-YCC IOPAMIDOL (7Q8ABQ-YCC) INJECTION 61%

[Series 2: abd/ pelvis 5.0 i30f 1 · axial · 0.83mm/px · z∈[-988,-518]mm · 13 of 106 slices shown, 15 images]
[im 6/106  soft-tissue]
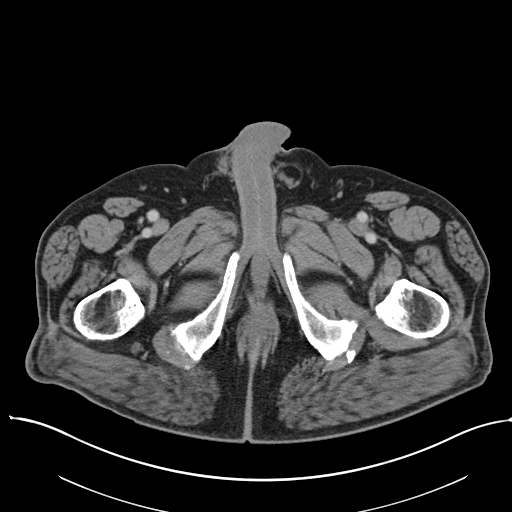
[im 6/106  bone]
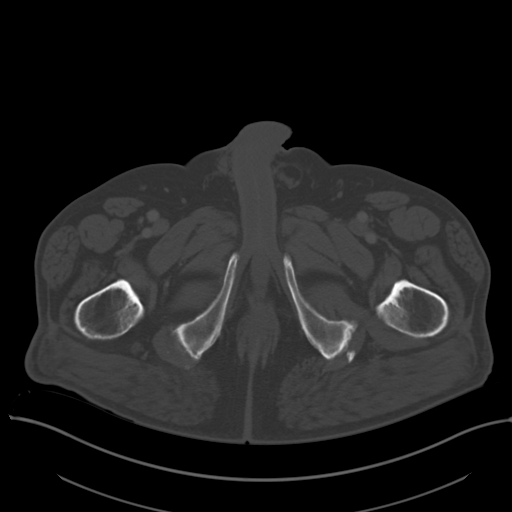
[im 17/106  soft-tissue]
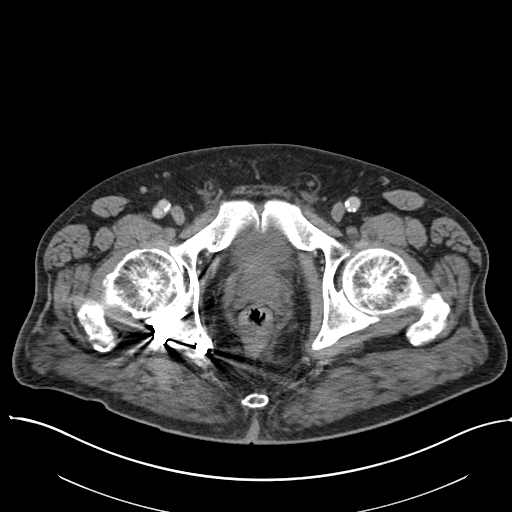
[im 23/106  soft-tissue]
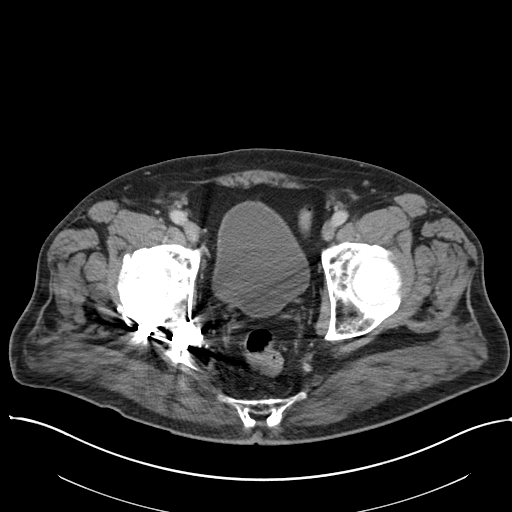
[im 28/106  soft-tissue]
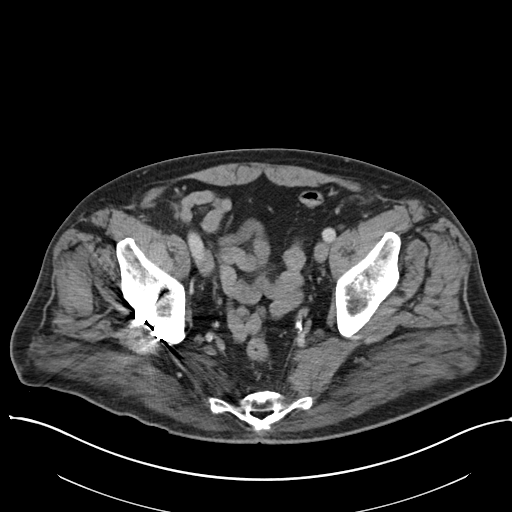
[im 39/106  soft-tissue]
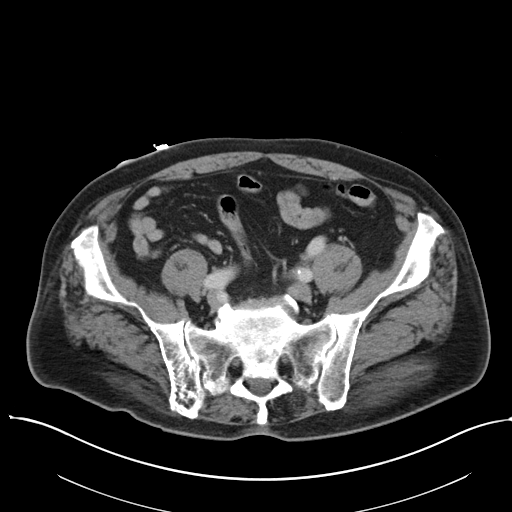
[im 45/106  soft-tissue]
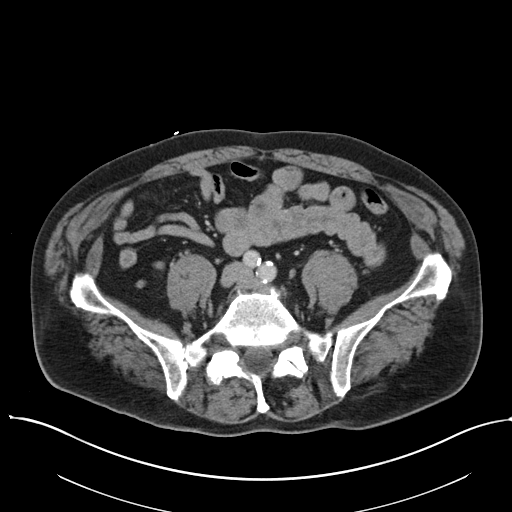
[im 56/106  soft-tissue]
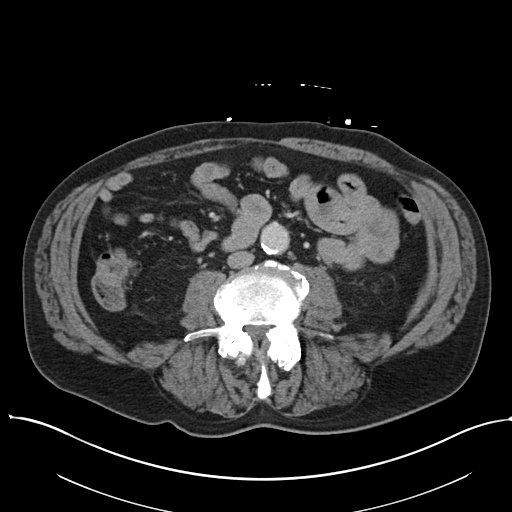
[im 61/106  soft-tissue]
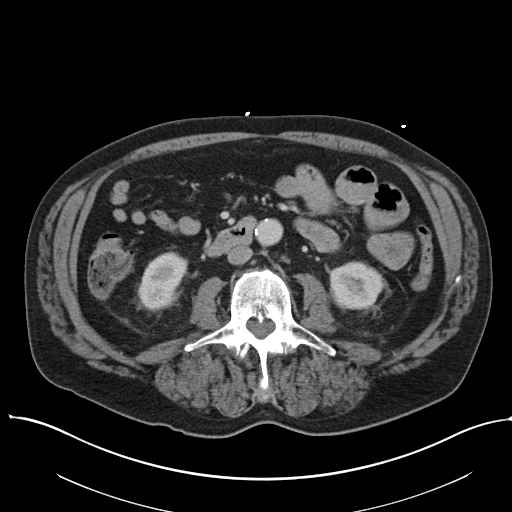
[im 67/106  soft-tissue]
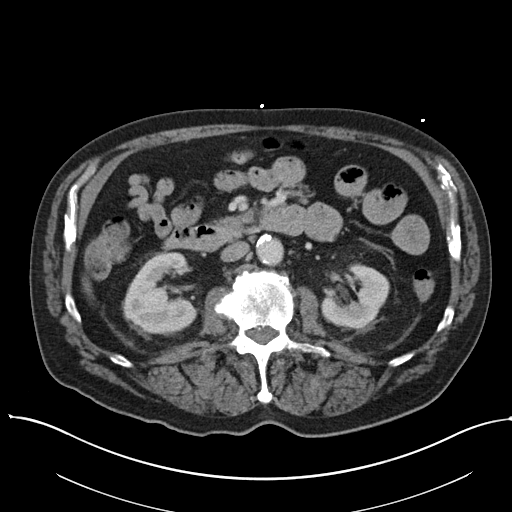
[im 67/106  bone]
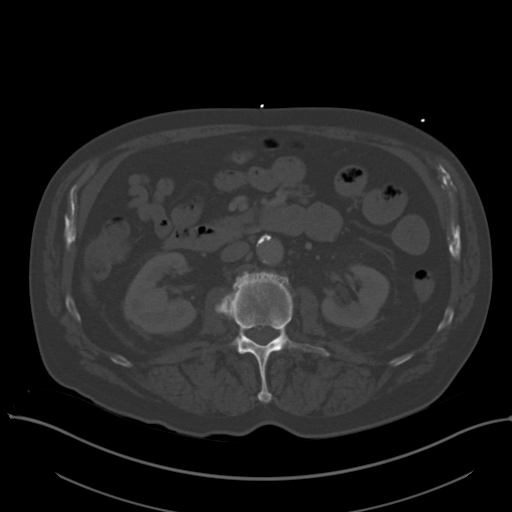
[im 78/106  soft-tissue]
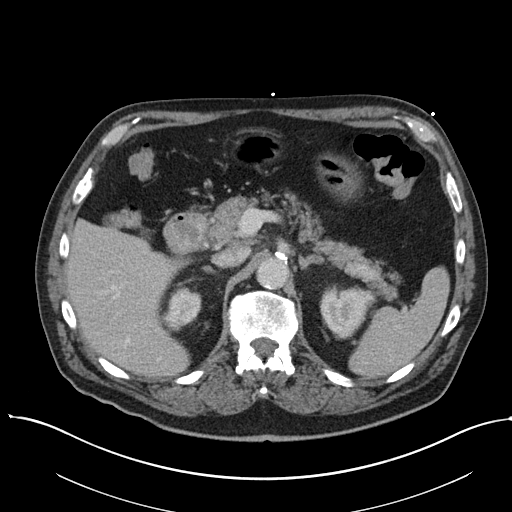
[im 83/106  soft-tissue]
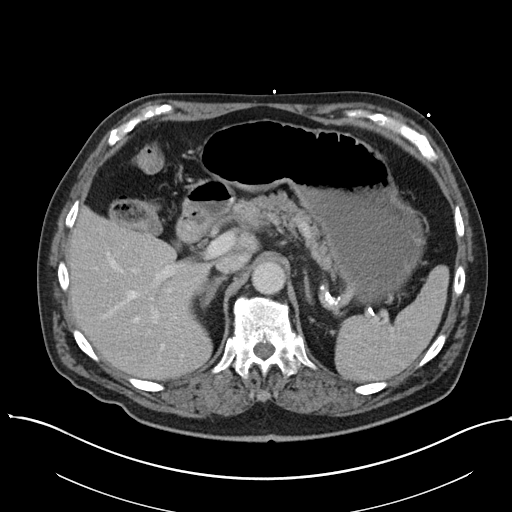
[im 89/106  soft-tissue]
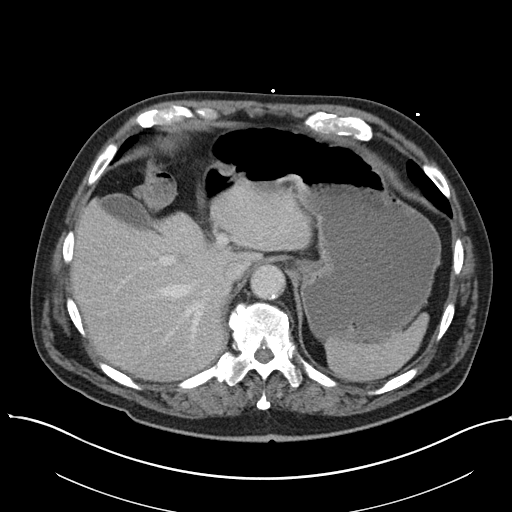
[im 100/106  soft-tissue]
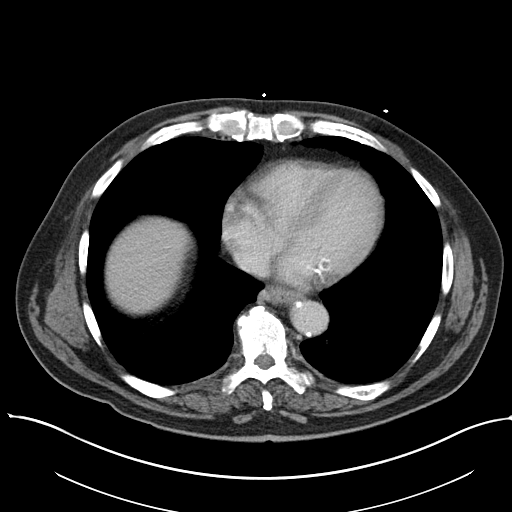

[Series 5: coronal soft tissue · coronal · 0.86mm/px · 3 of 89 slices shown]
[im 30/89  soft-tissue]
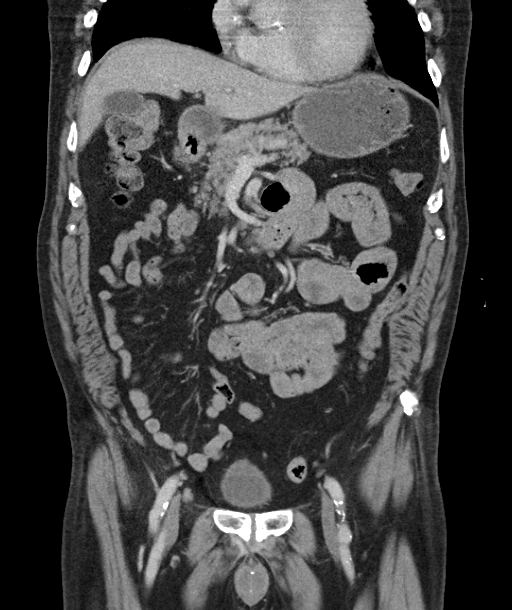
[im 40/89  soft-tissue]
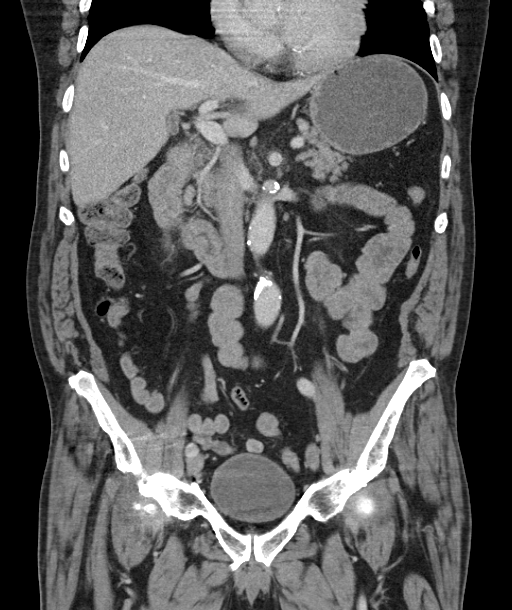
[im 49/89  soft-tissue]
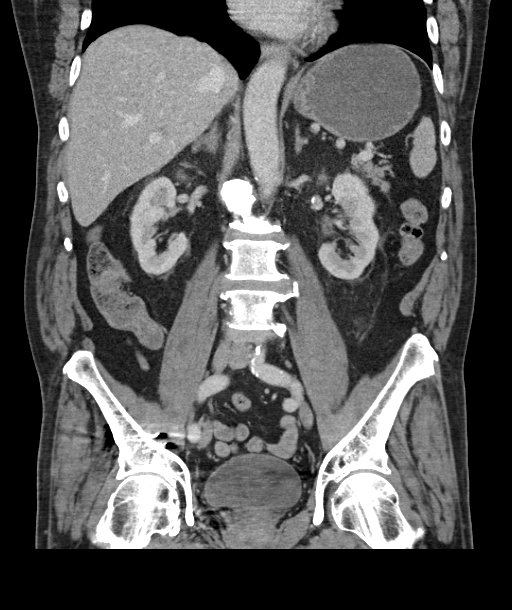

[16 of 46 positions shown; findings below may reference images not displayed]

FINDINGS: Lower chest: Lung bases are clear. No effusions. Heart is normal
size.

Hepatobiliary: No focal hepatic abnormality. Gallbladder
unremarkable.

Pancreas: No focal abnormality or ductal dilatation.

Spleen: No focal abnormality.  Normal size.

Adrenals/Urinary Tract: No adrenal abnormality. No focal renal
abnormality. No stones or hydronephrosis. Urinary bladder is
unremarkable.

Stomach/Bowel: Appendix is normal. Stomach, large and small bowel
grossly unremarkable.

Vascular/Lymphatic: Aortic and iliac atherosclerosis. No aneurysm.
No adenopathy.

Reproductive: No visible focal abnormality.

Other: No free fluid or free air. Small left inguinal hernia
containing fat.

Musculoskeletal: No acute bony abnormality or focal bone lesion.
Degenerative disc and facet disease in the lumbar spine.
Postoperative changes in the right hemipelvis.
IMPRESSION: No acute findings in the abdomen or pelvis.

Aortoiliac atherosclerosis.

Postsurgical changes in the right pelvis.

## 2017-12-15 ENCOUNTER — Ambulatory Visit: Payer: Medicare Other | Admitting: Gastroenterology

## 2018-01-05 DIAGNOSIS — H00025 Hordeolum internum left lower eyelid: Secondary | ICD-10-CM | POA: Diagnosis not present

## 2018-01-05 DIAGNOSIS — H01003 Unspecified blepharitis right eye, unspecified eyelid: Secondary | ICD-10-CM | POA: Diagnosis not present

## 2018-02-05 ENCOUNTER — Ambulatory Visit: Payer: Medicare Other | Admitting: Gastroenterology

## 2018-02-05 ENCOUNTER — Encounter: Payer: Self-pay | Admitting: Gastroenterology

## 2018-02-05 VITALS — BP 152/95 | HR 84 | Temp 97.0°F | Ht 78.5 in | Wt 240.0 lb

## 2018-02-05 DIAGNOSIS — K5900 Constipation, unspecified: Secondary | ICD-10-CM

## 2018-02-05 DIAGNOSIS — R1319 Other dysphagia: Secondary | ICD-10-CM | POA: Insufficient documentation

## 2018-02-05 DIAGNOSIS — R1310 Dysphagia, unspecified: Secondary | ICD-10-CM

## 2018-02-05 DIAGNOSIS — K2211 Ulcer of esophagus with bleeding: Secondary | ICD-10-CM

## 2018-02-05 DIAGNOSIS — D649 Anemia, unspecified: Secondary | ICD-10-CM | POA: Diagnosis not present

## 2018-02-05 MED ORDER — POLYETHYLENE GLYCOL 3350 17 GM/SCOOP PO POWD: 17.0000 g | Freq: Every day | ORAL | Status: DC | PRN

## 2018-02-05 NOTE — Assessment & Plan Note (Signed)
Follow-up on labs at this time. Colonoscopy and upper endoscopy as outlined above.

## 2018-02-05 NOTE — Assessment & Plan Note (Signed)
In the setting of food impaction. Patient denies ongoing esophageal dysphagia. No heartburn. Unfortunately I don't see a PPI on his medication list and he's had significant history of ulcers as well as duodenitis and esophageal ulcer at time of last endoscopy. H. pylori serology is negative.  Labs as outlined. Once results received, will discuss resuming PPI with patient.

## 2018-02-05 NOTE — Progress Notes (Signed)
Primary Care Physician: Biagio Borg, MD  Primary Gastroenterologist:  Garfield Cornea, MD   Chief Complaint  Patient presents with  . Anemia    f/u  . Gastroesophageal Reflux    doing ok    HPI: [name] is a 82 y.o. male here follow-up. He has a history of microcytic anemia back in 2017. He underwent EGD and colonoscopy back in August 2017. 2 polyps removed from the colon, one was to adenoma. He had one superficial esophageal ulcer with no stigmata of bleeding, noncritical Schatzki ring, erythematous mucosa in the stomach, one nonbleeding duodenal ulcer with no stigmata bleeding. H. pylori biopsy was negative. He was started on protonix 40 mg daily. Advised to avoid NSAIDs.  Patient was hospitalized back in November after getting food lodged in his esophagus. He tried to wash it down with liquid but began to vomit and vomited fresh blood. EGD performed by Dr. Laural Golden (on call provider) and was found to have a linear esophageal ulcer with no bleeding and stigmata of recent bleeding at 34 to 36 cm from the incisors. Lesion was 6 mm. Food found in the distal esophagus, removed. 3 cm hiatal hernia. Diffuse congestion, erosions, erythema the duodenal bulb. H. pylori serology's were negative. When he presented to the hospital his hemoglobin was 13.2, at discharge 11.9.  Clinically he is feeling well. He has chronic constipation. Generally has a bowel movement about every two days. Sometimes takes milk of magnesia but not weekly. Denies abdominal pain, melena, or rectal bleeding. Denies dysphagia. No heartburn. Appetite is good.  Current Outpatient Medications  Medication Sig Dispense Refill  . amLODipine (NORVASC) 10 MG tablet Take 1 tablet (10 mg total) by mouth daily. (Patient taking differently: Take 5 mg by mouth daily. ) 90 tablet 3  . atorvastatin (LIPITOR) 20 MG tablet Take 1 tablet (20 mg total) by mouth daily. (Patient taking differently: Take 40 mg by mouth daily. ) 90  tablet 3  . furosemide (LASIX) 40 MG tablet Take 1 tablet (40 mg total) by mouth daily. (Patient taking differently: Take 40 mg by mouth daily. Sometimes skips a day) 90 tablet 3  . glipiZIDE (GLUCOTROL XL) 10 MG 24 hr tablet TAKE 1 TABLET BY MOUTH EVERY DAY WITH BREAKFAST 90 tablet 3  . glucose blood (ONE TOUCH ULTRA TEST) test strip 1 each by Other route as needed for other. Use to check blood sugars twice a day  E11.9 200 each 1   No current facility-administered medications for this visit.     Allergies as of 02/05/2018 - Review Complete 02/05/2018  Allergen Reaction Noted  . Influenza vaccines  11/01/2017  . Labetalol hcl Swelling   . Shellfish allergy Swelling 08/27/2015    ROS:  General: Negative for anorexia, weight loss, fever, chills, fatigue, weakness. ENT: Negative for hoarseness, difficulty swallowing , nasal congestion. CV: Negative for chest pain, angina, palpitations, dyspnea on exertion, peripheral edema.  Respiratory: Negative for dyspnea at rest, dyspnea on exertion, cough, sputum, wheezing.  GI: See history of present illness. GU:  Negative for dysuria, hematuria, urinary incontinence, urinary frequency, nocturnal urination.  Endo: Negative for unusual weight change.    Physical Examination:   BP (!) 152/95   Pulse 84   Temp (!) 97 F (36.1 C) (Oral)   Ht 6' 6.5" (1.994 m)   Wt 240 lb (108.9 kg)   BMI 27.38 kg/m   General: Well-nourished, well-developed in no acute distress.  Eyes: No icterus.  Mouth: Oropharyngeal mucosa moist and pink , no lesions erythema or exudate. Abdomen: Bowel sounds are normal, nontender, nondistended, no hepatosplenomegaly or masses, no abdominal bruits or hernia , no rebound or guarding.   Extremities: No lower extremity edema. No clubbing or deformities. Neuro: Alert and oriented x 4   Skin: Warm and dry, no jaundice.   Psych: Alert and cooperative, normal mood and affect.  Labs:  Lab Results  Component Value Date    CREATININE 1.08 10/23/2017   BUN 19 10/23/2017   NA 136 10/23/2017   K 4.2 10/23/2017   CL 104 10/23/2017   CO2 25 10/23/2017   Lab Results  Component Value Date   ALT 16 (L) 10/21/2017   AST 19 10/21/2017   ALKPHOS 103 10/21/2017   BILITOT 0.5 10/21/2017   Lab Results  Component Value Date   WBC 8.0 10/23/2017   HGB 11.9 (L) 10/23/2017   HCT 36.8 (L) 10/23/2017   MCV 92.5 10/23/2017   PLT 186 10/23/2017     Imaging Studies: No results found.

## 2018-02-05 NOTE — Progress Notes (Signed)
CC'D TO PCP °

## 2018-02-05 NOTE — Patient Instructions (Signed)
1. For constipation, take one capful (17 grams) of Miralax at bedtime on days you do not have a good bowel movement. You can get this over the counter without a prescription.  2. At your convenience, please have your labs done to follow up on anemia. We will contact you with results as available.

## 2018-02-05 NOTE — Assessment & Plan Note (Signed)
Recommend taking MiraLAX 17 g at night time on days he does not have an adequate bowel movement.

## 2018-02-06 LAB — CBC WITH DIFFERENTIAL/PLATELET
BASOS PCT: 1.1 %
Basophils Absolute: 119 cells/uL (ref 0–200)
EOS ABS: 788 {cells}/uL — AB (ref 15–500)
EOS PCT: 7.3 %
HEMATOCRIT: 42.9 % (ref 38.5–50.0)
HEMOGLOBIN: 14.2 g/dL (ref 13.2–17.1)
Lymphs Abs: 1760 cells/uL (ref 850–3900)
MCH: 28 pg (ref 27.0–33.0)
MCHC: 33.1 g/dL (ref 32.0–36.0)
MCV: 84.6 fL (ref 80.0–100.0)
MONOS PCT: 9.5 %
MPV: 9.7 fL (ref 7.5–12.5)
NEUTROS ABS: 7106 {cells}/uL (ref 1500–7800)
Neutrophils Relative %: 65.8 %
Platelets: 271 10*3/uL (ref 140–400)
RBC: 5.07 10*6/uL (ref 4.20–5.80)
RDW: 13.1 % (ref 11.0–15.0)
Total Lymphocyte: 16.3 %
WBC mixed population: 1026 cells/uL — ABNORMAL HIGH (ref 200–950)
WBC: 10.8 10*3/uL (ref 3.8–10.8)

## 2018-02-06 LAB — IRON,TIBC AND FERRITIN PANEL
%SAT: 34 % (calc) (ref 15–60)
FERRITIN: 94 ng/mL (ref 20–380)
IRON: 107 ug/dL (ref 50–180)
TIBC: 311 mcg/dL (calc) (ref 250–425)

## 2018-02-08 ENCOUNTER — Encounter (INDEPENDENT_AMBULATORY_CARE_PROVIDER_SITE_OTHER): Payer: Self-pay | Admitting: Orthopaedic Surgery

## 2018-02-08 ENCOUNTER — Ambulatory Visit (INDEPENDENT_AMBULATORY_CARE_PROVIDER_SITE_OTHER): Payer: Self-pay

## 2018-02-08 ENCOUNTER — Ambulatory Visit (INDEPENDENT_AMBULATORY_CARE_PROVIDER_SITE_OTHER): Payer: Medicare Other | Admitting: Orthopaedic Surgery

## 2018-02-08 VITALS — BP 136/86 | HR 95 | Resp 16 | Ht 78.5 in | Wt 240.0 lb

## 2018-02-08 DIAGNOSIS — M79641 Pain in right hand: Secondary | ICD-10-CM | POA: Diagnosis not present

## 2018-02-08 DIAGNOSIS — M79642 Pain in left hand: Secondary | ICD-10-CM

## 2018-02-08 MED ORDER — LIDOCAINE HCL 1 % IJ SOLN
1.0000 mL | INTRAMUSCULAR | Status: AC | PRN
  Administered 2018-02-08: 1 mL

## 2018-02-08 MED ORDER — METHYLPREDNISOLONE ACETATE 40 MG/ML IJ SUSP
20.0000 mg | INTRAMUSCULAR | Status: AC | PRN
  Administered 2018-02-08: 20 mg via INTRA_ARTICULAR
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
## 2018-02-09 ENCOUNTER — Ambulatory Visit (INDEPENDENT_AMBULATORY_CARE_PROVIDER_SITE_OTHER): Payer: Medicare Other | Admitting: Orthopaedic Surgery

## 2018-02-10 NOTE — Progress Notes (Signed)
Hemoglobin normal. Iron normal.   Patient needs to be on a PPI if he is not on one (used to be on pantoprazole but it is no longer on his drug list) due to h/o gastritis, esophageal ulcer at time of last EGD. He has h/o esophageal and duodenal ulcers in the past as well (2017).  If he is in agreement let me know and I will send in RX.   Can we also call [facility] and find out what WBC mixed population means in the CBC w/diff. I specifically need to know if that means there are "abnormal white blood cells"

## 2018-02-22 ENCOUNTER — Telehealth: Payer: Self-pay | Admitting: *Deleted

## 2018-02-22 MED ORDER — PANTOPRAZOLE SODIUM 40 MG PO TBEC: 40.0000 mg | DELAYED_RELEASE_TABLET | Freq: Every day | ORAL | 11 refills | Status: DC

## 2018-02-22 NOTE — Telephone Encounter (Signed)
Patient spouse called in. They went to [facility] to pick up the PPI she was told was going to be called in and is not there. Spouse is requesting we call her once this has been sent in. Please advise Magda Paganini thanks

## 2018-02-22 NOTE — Telephone Encounter (Signed)
RX sent. Please let patient know

## 2018-02-22 NOTE — Addendum Note (Signed)
Addended by: Mahala Menghini on: 02/22/2018 10:08 AM   Modules accepted: Orders

## 2018-02-22 NOTE — Telephone Encounter (Signed)
Spouse aware Rx called in. Nothing further needed.

## 2018-02-25 NOTE — Progress Notes (Signed)
Noted. Also discussed with Dr. Gala Romney. Patient had esophageal ulcers on two different EGDS. Recommends following clinically and not necessary to repeat EGD at this time as he is doing well.

## 2018-03-06 ENCOUNTER — Telehealth: Payer: Self-pay | Admitting: Internal Medicine

## 2018-03-06 NOTE — Telephone Encounter (Signed)
Patient's wife, Blanch Media did not feel this is emergency for today---does not think patient needs to go to ED---has made appt for 3/27

## 2018-03-06 NOTE — Telephone Encounter (Signed)
Needs ROV 

## 2018-03-06 NOTE — Telephone Encounter (Unsigned)
Copied from [place] [phone]. Topic: Quick Communication - See Telephone Encounter >> Mar 06, 2018  9:51 AM Hewitt Shorts wrote: CRM for notification. See Telephone encounter for: 03/06/18.wendy from [facility] went to patient was having irregular heart and also has a heart murmur he was not aware of and also he did not pass his  Dementia test   Best bust [phone] [facility]

## 2018-03-06 NOTE — Telephone Encounter (Signed)
Routing to dr Jenny Reichmann, please advise if you think patient needs office visit or any other advisement, I will call patient to discuss, thanks

## 2018-03-07 ENCOUNTER — Ambulatory Visit (INDEPENDENT_AMBULATORY_CARE_PROVIDER_SITE_OTHER): Payer: Medicare Other | Admitting: Family

## 2018-03-07 ENCOUNTER — Encounter: Payer: Self-pay | Admitting: Family

## 2018-03-07 VITALS — BP 130/78 | HR 74 | Temp 98.4°F | Ht 78.0 in | Wt 241.1 lb

## 2018-03-07 DIAGNOSIS — I499 Cardiac arrhythmia, unspecified: Secondary | ICD-10-CM | POA: Diagnosis not present

## 2018-03-07 DIAGNOSIS — R413 Other amnesia: Secondary | ICD-10-CM | POA: Diagnosis not present
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
## 2018-03-13 ENCOUNTER — Encounter: Payer: Self-pay | Admitting: Neurology

## 2018-03-20 ENCOUNTER — Ambulatory Visit (INDEPENDENT_AMBULATORY_CARE_PROVIDER_SITE_OTHER): Payer: Medicare Other | Admitting: Orthopaedic Surgery

## 2018-03-20 ENCOUNTER — Encounter (INDEPENDENT_AMBULATORY_CARE_PROVIDER_SITE_OTHER): Payer: Self-pay | Admitting: Orthopaedic Surgery

## 2018-03-20 VITALS — BP 153/80 | HR 73 | Resp 16 | Ht 79.0 in | Wt 240.0 lb

## 2018-03-20 DIAGNOSIS — M545 Low back pain: Secondary | ICD-10-CM

## 2018-03-20 DIAGNOSIS — G8929 Other chronic pain: Secondary | ICD-10-CM | POA: Diagnosis not present

## 2018-03-20 MED ORDER — LIDOCAINE HCL 1 % IJ SOLN
2.0000 mL | INTRAMUSCULAR | Status: AC | PRN
  Administered 2018-03-20: 2 mL

## 2018-03-20 MED ORDER — METHYLPREDNISOLONE ACETATE 40 MG/ML IJ SUSP
40.0000 mg | INTRAMUSCULAR | Status: AC | PRN
  Administered 2018-03-20: 40 mg via INTRAMUSCULAR
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
## 2018-04-03 ENCOUNTER — Other Ambulatory Visit: Payer: Self-pay | Admitting: Internal Medicine

## 2018-04-10 ENCOUNTER — Encounter (INDEPENDENT_AMBULATORY_CARE_PROVIDER_SITE_OTHER): Payer: Self-pay | Admitting: Orthopaedic Surgery

## 2018-04-10 ENCOUNTER — Ambulatory Visit (INDEPENDENT_AMBULATORY_CARE_PROVIDER_SITE_OTHER): Payer: Medicare Other | Admitting: Orthopaedic Surgery

## 2018-04-10 DIAGNOSIS — M5441 Lumbago with sciatica, right side: Secondary | ICD-10-CM | POA: Diagnosis not present

## 2018-04-10 DIAGNOSIS — G8929 Other chronic pain: Secondary | ICD-10-CM | POA: Diagnosis not present

## 2018-04-10 DIAGNOSIS — M5442 Lumbago with sciatica, left side: Secondary | ICD-10-CM | POA: Diagnosis not present

## 2018-04-10 MED ORDER — BUPIVACAINE HCL 0.5 % IJ SOLN
2.0000 mL | INTRAMUSCULAR | Status: AC | PRN
  Administered 2018-04-10: 2 mL

## 2018-04-10 MED ORDER — LIDOCAINE HCL 2 % IJ SOLN
2.0000 mL | INTRAMUSCULAR | Status: AC | PRN
  Administered 2018-04-10: 2 mL

## 2018-04-10 MED ORDER — METHYLPREDNISOLONE ACETATE 40 MG/ML IJ SUSP
40.0000 mg | INTRAMUSCULAR | Status: AC | PRN
  Administered 2018-04-10: 40 mg via INTRAMUSCULAR
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
## 2018-05-02 ENCOUNTER — Other Ambulatory Visit (INDEPENDENT_AMBULATORY_CARE_PROVIDER_SITE_OTHER): Payer: Medicare Other

## 2018-05-02 ENCOUNTER — Ambulatory Visit (INDEPENDENT_AMBULATORY_CARE_PROVIDER_SITE_OTHER): Payer: Medicare Other | Admitting: Internal Medicine

## 2018-05-02 ENCOUNTER — Other Ambulatory Visit: Payer: Self-pay | Admitting: Internal Medicine

## 2018-05-02 ENCOUNTER — Encounter: Payer: Self-pay | Admitting: Internal Medicine

## 2018-05-02 VITALS — BP 118/76 | HR 77 | Temp 98.1°F | Ht 79.0 in | Wt 237.0 lb

## 2018-05-02 DIAGNOSIS — Z0000 Encounter for general adult medical examination without abnormal findings: Secondary | ICD-10-CM

## 2018-05-02 DIAGNOSIS — E119 Type 2 diabetes mellitus without complications: Secondary | ICD-10-CM

## 2018-05-02 LAB — PSA: PSA: 3.21 ng/mL (ref 0.10–4.00)

## 2018-05-02 LAB — CBC WITH DIFFERENTIAL/PLATELET
BASOS ABS: 0.1 10*3/uL (ref 0.0–0.1)
Basophils Relative: 0.9 % (ref 0.0–3.0)
Eosinophils Absolute: 0.3 10*3/uL (ref 0.0–0.7)
Eosinophils Relative: 3.1 % (ref 0.0–5.0)
HCT: 40.5 % (ref 39.0–52.0)
Hemoglobin: 13.4 g/dL (ref 13.0–17.0)
LYMPHS ABS: 1.5 10*3/uL (ref 0.7–4.0)
Lymphocytes Relative: 16.7 % (ref 12.0–46.0)
MCHC: 33.1 g/dL (ref 30.0–36.0)
MCV: 87.1 fl (ref 78.0–100.0)
MONO ABS: 0.9 10*3/uL (ref 0.1–1.0)
Monocytes Relative: 10.1 % (ref 3.0–12.0)
NEUTROS PCT: 69.2 % (ref 43.0–77.0)
Neutro Abs: 6.1 10*3/uL (ref 1.4–7.7)
Platelets: 275 10*3/uL (ref 150.0–400.0)
RBC: 4.65 Mil/uL (ref 4.22–5.81)
RDW: 15.8 % — ABNORMAL HIGH (ref 11.5–15.5)
WBC: 8.8 10*3/uL (ref 4.0–10.5)

## 2018-05-02 LAB — HEPATIC FUNCTION PANEL
ALK PHOS: 99 U/L (ref 39–117)
ALT: 10 U/L (ref 0–53)
AST: 13 U/L (ref 0–37)
Albumin: 4.2 g/dL (ref 3.5–5.2)
BILIRUBIN DIRECT: 0.1 mg/dL (ref 0.0–0.3)
TOTAL PROTEIN: 6.9 g/dL (ref 6.0–8.3)
Total Bilirubin: 0.6 mg/dL (ref 0.2–1.2)

## 2018-05-02 LAB — TSH: TSH: 0.92 u[IU]/mL (ref 0.35–4.50)

## 2018-05-02 LAB — BASIC METABOLIC PANEL
BUN: 21 mg/dL (ref 6–23)
CO2: 28 meq/L (ref 19–32)
Calcium: 9.5 mg/dL (ref 8.4–10.5)
Chloride: 104 mEq/L (ref 96–112)
Creatinine, Ser: 1.43 mg/dL (ref 0.40–1.50)
GFR: 50.58 mL/min — ABNORMAL LOW (ref >60.00)
Glucose, Bld: 207 mg/dL — ABNORMAL HIGH (ref 70–99)
POTASSIUM: 4.4 meq/L (ref 3.5–5.1)
Sodium: 140 mEq/L (ref 135–145)

## 2018-05-02 LAB — URINALYSIS, ROUTINE W REFLEX MICROSCOPIC
Bilirubin Urine: NEGATIVE
HGB URINE DIPSTICK: NEGATIVE
Ketones, ur: NEGATIVE
Leukocytes, UA: NEGATIVE
NITRITE: NEGATIVE
RBC / HPF: NONE SEEN (ref 0–?)
Specific Gravity, Urine: >=1.03 — AB (ref 1.000–1.030)
URINE GLUCOSE: 500 — AB
Urobilinogen, UA: 0.2 (ref 0.0–1.0)
pH: 5.5 (ref 5.0–8.0)

## 2018-05-02 LAB — LIPID PANEL
Cholesterol: 172 mg/dL (ref 0–200)
HDL: 42.2 mg/dL (ref >39.00)
NONHDL: 129.75
Total CHOL/HDL Ratio: 4
Triglycerides: 287 mg/dL — ABNORMAL HIGH (ref 0.0–149.0)
VLDL: 57.4 mg/dL — ABNORMAL HIGH (ref 0.0–40.0)

## 2018-05-02 LAB — MICROALBUMIN / CREATININE URINE RATIO
Creatinine,U: 156.6 mg/dL
MICROALB/CREAT RATIO: 8.3 mg/g (ref 0.0–30.0)
Microalb, Ur: 12.9 mg/dL — ABNORMAL HIGH (ref 0.0–1.9)

## 2018-05-02 LAB — HEMOGLOBIN A1C: HEMOGLOBIN A1C: 7.9 % — AB (ref 4.6–6.5)

## 2018-05-02 LAB — LDL CHOLESTEROL, DIRECT: LDL DIRECT: 100 mg/dL

## 2018-05-02 MED ORDER — METFORMIN HCL ER 500 MG PO TB24: 500.0000 mg | ORAL_TABLET | Freq: Every day | ORAL | 3 refills | Status: DC

## 2018-05-02 NOTE — Assessment & Plan Note (Signed)
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
## 2018-05-02 NOTE — Patient Instructions (Signed)

## 2018-05-02 NOTE — Assessment & Plan Note (Signed)
stable overall by history and exam, recent data reviewed with pt, and pt to continue medical treatment as before,  to f/u any worsening symptoms or concerns Lab Results  Component Value Date   HGBA1C 6.8 11/01/2017

## 2018-05-03 ENCOUNTER — Telehealth: Payer: Self-pay | Admitting: Internal Medicine

## 2018-05-03 ENCOUNTER — Telehealth: Payer: Self-pay

## 2018-05-03 NOTE — Telephone Encounter (Signed)
Called pt's wife, LVM with details below.

## 2018-05-03 NOTE — Telephone Encounter (Signed)
Copied from [place] [id number]. Topic: General - Other >> May 03, 2018 11:00 AM Juliet Rude, CMA wrote: Reason for CRM: to discuss lab results, ok for [facility] to inform   Pt's wife returning call about labs CB# [phone]

## 2018-05-03 NOTE — Telephone Encounter (Signed)
Returned call to the pt.; spoke with pt. and wife; results given.  See result note.

## 2018-05-03 NOTE — Telephone Encounter (Signed)
Nope, that would be all that is needed, thanks

## 2018-05-03 NOTE — Telephone Encounter (Signed)
-----   Message from Biagio Borg, MD sent at 05/02/2018  5:12 PM EDT ----- Letter sent, cont same tx except  The test results show that your current treatment is OK, except the A1c is mildly elevated.  Please add metformin ER 500 mg - 1 per day, and continue all other medication.  A new prescription will be sent, and you should hear from the office as well.   Shirron to please inform pt or pt wife, I will do rx

## 2018-05-03 NOTE — Telephone Encounter (Signed)
-----   Message from Biagio Borg, MD sent at 05/03/2018  2:16 PM EDT ----- This would not be optimal, as his a1c was moderately high (not mildly high) and would ideally need to be at least better controlled to avoid problems such as increased cardiovascular disease risk or infection such as pneumonia

## 2018-05-03 NOTE — Telephone Encounter (Signed)
Called pt, LVM.   CRM created.  

## 2018-05-03 NOTE — Telephone Encounter (Signed)
Pt's wife stated she would let the patient know and would try to have the patient change his mind but could not make any promises. Is it any other medication options?

## 2018-05-10 ENCOUNTER — Ambulatory Visit: Payer: Medicare Other | Admitting: Cardiology

## 2018-05-10 ENCOUNTER — Encounter: Payer: Self-pay | Admitting: Cardiology

## 2018-05-10 DIAGNOSIS — E782 Mixed hyperlipidemia: Secondary | ICD-10-CM

## 2018-05-10 DIAGNOSIS — K219 Gastro-esophageal reflux disease without esophagitis: Secondary | ICD-10-CM | POA: Diagnosis not present

## 2018-05-10 DIAGNOSIS — R002 Palpitations: Secondary | ICD-10-CM | POA: Diagnosis not present

## 2018-05-10 DIAGNOSIS — E119 Type 2 diabetes mellitus without complications: Secondary | ICD-10-CM | POA: Diagnosis not present

## 2018-05-10 DIAGNOSIS — I10 Essential (primary) hypertension: Secondary | ICD-10-CM | POA: Diagnosis not present

## 2018-05-10 DIAGNOSIS — N183 Chronic kidney disease, stage 3 unspecified: Secondary | ICD-10-CM

## 2018-05-10 NOTE — Assessment & Plan Note (Signed)
Pt referred to cardiology after a HHRN noticed "irregular heart beat". Pt is asymptomatic

## 2018-05-10 NOTE — Assessment & Plan Note (Signed)
On Glucotrol, Glucophage

## 2018-05-10 NOTE — Patient Instructions (Signed)
Medication Instructions:  Your physician recommends that you continue on your current medications as directed. Please refer to the Current Medication list given to you today.   Labwork: none  Testing/Procedures: Your physician has requested that you have an echocardiogram. Echocardiography is a painless test that uses sound waves to create images of your heart. It provides your doctor with information about the size and shape of your heart and how well your heart's chambers and valves are working. This procedure takes approximately one hour. There are no restrictions for this procedure.   Follow-Up: Follow up with our office as needed.   Any Other Special Instructions Will Be Listed Below (If Applicable).     If you need a refill on your cardiac medications before your next appointment, please call your pharmacy.

## 2018-05-10 NOTE — Progress Notes (Signed)
05/10/2018 [name]   12/13/1935  [id number]  Primary Physician Jenny Reichmann Hunt Oris, MD Primary Cardiologist: Dr Stanford Breed (if needed)  HPI:  82 y/o male followed by Dr Jenny Reichmann with a history of HTN, NIDDM, CRI-3, and HLD. He  Is seen in the office today after a HHRN noted an "irregular heart beat". The pt has no history of tachycardia or palpitations. He watches his B/P closely at home and has not noticed an irregular heart rate on his monitor. He has no history of MI or CAD. He denies chest pain or unusual dyspnea. There is a record of a low risk Nuclear stress done in 2005. An echo was ordered in 2017 but I could find no record of the results.    Current Outpatient Medications  Medication Sig Dispense Refill  . amLODipine (NORVASC) 10 MG tablet Take 1 tablet (10 mg total) by mouth daily. (Patient taking differently: Take 5 mg by mouth daily. ) 90 tablet 3  . amLODipine (NORVASC) 5 MG tablet TAKE 1 TABLET (5 MG TOTAL) BY MOUTH DAILY. 90 tablet 1  . atorvastatin (LIPITOR) 20 MG tablet Take 1 tablet (20 mg total) by mouth daily. (Patient taking differently: Take 40 mg by mouth daily. ) 90 tablet 3  . furosemide (LASIX) 40 MG tablet Take 1 tablet (40 mg total) by mouth daily. (Patient taking differently: Take 40 mg by mouth daily. Sometimes skips a day) 90 tablet 3  . glipiZIDE (GLUCOTROL XL) 10 MG 24 hr tablet TAKE 1 TABLET BY MOUTH EVERY DAY WITH BREAKFAST 90 tablet 3  . glucose blood (ONE TOUCH ULTRA TEST) test strip 1 each by Other route as needed for other. Use to check blood sugars twice a day  E11.9 200 each 1  . pantoprazole (PROTONIX) 40 MG tablet Take 1 tablet (40 mg total) by mouth daily before breakfast. 30 tablet 11  . polyethylene glycol powder (MIRALAX) powder Take 17 g by mouth daily as needed.    . testosterone cypionate (DEPOTESTOSTERONE CYPIONATE) 200 MG/ML injection INJECT 1ML INTRAMUSCULARLY EVERY 14 DAYS  5  . metFORMIN (GLUCOPHAGE-XR) 500 MG 24 hr tablet Take 1 tablet (500  mg total) by mouth daily with breakfast. (Patient not taking: Reported on 05/10/2018) 90 tablet 3   No current facility-administered medications for this visit.     Allergies  Allergen Reactions  . Influenza Vaccines   . Labetalol Hcl Swelling    REACTION: Throat itching and swelling  . Shellfish Allergy Swelling    Swelling of elbow only, hasn't eaten shrimp since that event        Past Medical History:  Diagnosis Date  . Allergic rhinitis, cause unspecified 07/22/2014  . Arthritis    gout- elbow, knees   . Cancer (HCC)    facial - nose, lip   . CLAUDICATION 08/12/2009  . Diabetes mellitus, type 2 ([place])   . DIABETES MELLITUS, TYPE II 08/09/2007  . Duodenal ulcer 2017  . ERECTILE DYSFUNCTION 02/10/2009  . Esophageal ulcer 2017  . Esophagitis   . FLANK PAIN, LEFT 07/05/2010  . Gastritis   . GERD (gastroesophageal reflux disease)   . Gout   . GOUT 08/09/2007  . Hyperlipidemia   . HYPERLIPIDEMIA 08/09/2007  . Hypertension   . HYPERTENSION 08/09/2007  . NEPHROLITHIASIS 07/05/2010  . 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 PUD (peptic ulcer disease)   . ROTATOR CUFF SYNDROME, RIGHT 08/11/2008    Social History   Socioeconomic History  . Marital status: Married  Spouse name: Not on file  . Number of children: 2  . Years of education: 49  . Highest education level: Not on file  Occupational History  . 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 Financial resource strain: Not on file  . Food insecurity:    Worry: Not on file    Inability: Not on file  . Transportation needs:    Medical: Not on file    Non-medical: Not on file  Tobacco Use  . Smoking status: Former Smoker    Packs/day: 0.25    Years: 5.00    Pack years: 1.25    Types: Cigarettes    Last attempt to quit: 07/28/1963    Years since quitting: 54.8  . Smokeless tobacco: Never Used  . Tobacco comment: social smoker  Substance and Sexual Activity  . Alcohol use: Yes    Comment:  occasional  . Drug use: No  . Sexual activity: Yes    Partners: Female  Lifestyle  . Physical activity:    Days per week: Not on file    Minutes per session: Not on file  . Stress: Not on file  Relationships  . Social connections:    Talks on phone: Not on file    Gets together: Not on file    Attends religious service: Not on file    Active member of club or organization: Not on file    Attends meetings of clubs or organizations: Not on file    Relationship status: Not on file  . Intimate partner violence:    Fear of current or ex partner: Not on file    Emotionally abused: Not on file    Physically abused: Not on file    Forced sexual activity: Not on file  Other Topics Concern  . Not on file  Social History Narrative   HS[employer], Occupation:retired tobacco farmer. Married '63-happily: widowed in '09 wife Luanna Cole) succumbed to vasculitis. Remarried  Fall '12. 2 daughters - '65, '67; 3 grandchildren. gardner and remains active     Family History  Problem Relation Age of Onset  . Cancer Mother        ovarian  . Cancer Father        prostate  . Diabetes Neg Hx   . Heart disease Neg Hx   . Hypertension Neg Hx   . Colon cancer Neg Hx   . Esophageal cancer Neg Hx   . Rectal cancer Neg Hx   . Stomach cancer Neg Hx      Review of Systems: General: negative for chills, fever, night sweats or weight changes.  Cardiovascular: negative for chest pain, dyspnea on exertion, edema, orthopnea, palpitations, paroxysmal nocturnal dyspnea or shortness of breath Dermatological: negative for rash Respiratory: negative for cough or wheezing Urologic: negative for hematuria Abdominal: negative for nausea, vomiting, diarrhea, bright red blood per rectum, melena, or hematemesis Neurologic: negative for visual changes, syncope, or dizziness All other systems reviewed and are otherwise negative except as noted above.    Blood pressure (!) 144/90, pulse 66, height 6' 6.5" (1.994 m), weight  232 lb 9.6 oz (105.5 kg).  General appearance: alert, cooperative, no distress and HOH- hearing aid in place Neck: no carotid bruit and no JVD Lungs: clear to auscultation bilaterally Heart: regular rate and rhythm and soft systolic murmur AOV Abdomen: soft, non-tender; bowel sounds normal; no masses,  no organomegaly Extremities: extremities normal, atraumatic, no cyanosis or edema  Pulses: 2+ and symmetric Skin: Skin color, texture, turgor normal. No rashes or lesions Neurologic: Grossly normal  EKG NSR-66, PR 220, LAD  ASSESSMENT AND PLAN:   Palpitations Pt referred to cardiology after a HHRN noticed "irregular heart beat". Pt is asymptomatic  Essential hypertension Followed by PCP- he takes his B/P 2-3 times a day  Hyperlipidemia LDL 100 may 2019  [name] (chronic kidney disease) stage 3, GFR 30-59 ml/min (HCC) SCr 1.43 05/02/18  Non-insulin treated type 2 diabetes mellitus (HCC) On Glucotrol, Glucophage  GERD (gastroesophageal reflux disease) Chronic PPI   PLAN  Discussed with Dr Stanford Breed in the office today, check echo. If his LVF is normal- reassurance and PRN f/u.   Kerin Ransom PA-C 05/10/2018 9:35 AM

## 2018-05-10 NOTE — Assessment & Plan Note (Signed)
LDL 100 may 2019

## 2018-05-10 NOTE — Assessment & Plan Note (Signed)
Followed by PCP- he takes his B/P 2-3 times a day

## 2018-05-10 NOTE — Assessment & Plan Note (Signed)
Chronic PPI

## 2018-05-10 NOTE — Assessment & Plan Note (Signed)
SCr 1.43 05/02/18

## 2018-05-15 ENCOUNTER — Other Ambulatory Visit (INDEPENDENT_AMBULATORY_CARE_PROVIDER_SITE_OTHER): Payer: Medicare Other

## 2018-05-15 DIAGNOSIS — R002 Palpitations: Secondary | ICD-10-CM | POA: Diagnosis not present

## 2018-05-15 DIAGNOSIS — I5031 Acute diastolic (congestive) heart failure: Secondary | ICD-10-CM

## 2018-05-21 ENCOUNTER — Other Ambulatory Visit: Payer: Self-pay

## 2018-05-21 ENCOUNTER — Ambulatory Visit (HOSPITAL_COMMUNITY): Payer: Medicare Other | Attending: Cardiology

## 2018-05-21 DIAGNOSIS — I10 Essential (primary) hypertension: Secondary | ICD-10-CM | POA: Diagnosis not present

## 2018-05-21 DIAGNOSIS — E785 Hyperlipidemia, unspecified: Secondary | ICD-10-CM | POA: Diagnosis not present

## 2018-05-21 DIAGNOSIS — I77810 Thoracic aortic ectasia: Secondary | ICD-10-CM | POA: Diagnosis not present

## 2018-05-21 DIAGNOSIS — E119 Type 2 diabetes mellitus without complications: Secondary | ICD-10-CM | POA: Diagnosis not present

## 2018-05-21 DIAGNOSIS — R002 Palpitations: Secondary | ICD-10-CM | POA: Diagnosis not present

## 2018-05-21 HISTORY — PX: TRANSTHORACIC ECHOCARDIOGRAM: SHX275

## 2018-06-01 ENCOUNTER — Ambulatory Visit: Payer: Medicare Other | Admitting: Neurology

## 2018-06-19 DIAGNOSIS — H524 Presbyopia: Secondary | ICD-10-CM | POA: Diagnosis not present

## 2018-06-19 DIAGNOSIS — H2513 Age-related nuclear cataract, bilateral: Secondary | ICD-10-CM | POA: Diagnosis not present

## 2018-06-19 DIAGNOSIS — E119 Type 2 diabetes mellitus without complications: Secondary | ICD-10-CM | POA: Diagnosis not present

## 2018-06-19 DIAGNOSIS — H5203 Hypermetropia, bilateral: Secondary | ICD-10-CM | POA: Diagnosis not present

## 2018-06-19 DIAGNOSIS — H269 Unspecified cataract: Secondary | ICD-10-CM | POA: Diagnosis not present

## 2018-06-19 LAB — HM DIABETES EYE EXAM

## 2018-07-19 ENCOUNTER — Other Ambulatory Visit: Payer: Self-pay | Admitting: Internal Medicine

## 2018-07-19 NOTE — Telephone Encounter (Signed)
Done erx 

## 2018-07-20 ENCOUNTER — Other Ambulatory Visit: Payer: Self-pay | Admitting: Internal Medicine

## 2018-07-20 MED ORDER — TESTOSTERONE CYPIONATE 200 MG/ML IM SOLN: 200.0000 mg | INTRAMUSCULAR | 3 refills | Status: DC

## 2018-07-23 ENCOUNTER — Encounter: Payer: Self-pay | Admitting: Internal Medicine

## 2018-07-23 ENCOUNTER — Ambulatory Visit (INDEPENDENT_AMBULATORY_CARE_PROVIDER_SITE_OTHER): Payer: Medicare Other | Admitting: Internal Medicine

## 2018-07-23 VITALS — BP 122/78 | HR 85 | Temp 98.3°F | Ht 78.5 in | Wt 239.0 lb

## 2018-07-23 DIAGNOSIS — I10 Essential (primary) hypertension: Secondary | ICD-10-CM | POA: Diagnosis not present

## 2018-07-23 DIAGNOSIS — M19049 Primary osteoarthritis, unspecified hand: Secondary | ICD-10-CM | POA: Diagnosis not present

## 2018-07-23 DIAGNOSIS — N183 Chronic kidney disease, stage 3 unspecified: Secondary | ICD-10-CM

## 2018-07-23 DIAGNOSIS — M109 Gout, unspecified: Secondary | ICD-10-CM | POA: Insufficient documentation

## 2018-07-23 DIAGNOSIS — E119 Type 2 diabetes mellitus without complications: Secondary | ICD-10-CM | POA: Diagnosis not present

## 2018-07-23 LAB — POCT GLYCOSYLATED HEMOGLOBIN (HGB A1C)
HBA1C, POC (CONTROLLED DIABETIC RANGE): 0 % (ref 0.0–7.0)
HBA1C, POC (PREDIABETIC RANGE): 0 % — AB (ref 5.7–6.4)
HbA1c POC (<> result, manual entry): 0 % (ref 4.0–5.6)
Hemoglobin A1C: 6.9 % — AB (ref 4.0–5.6)

## 2018-07-23 MED ORDER — PREDNISONE 10 MG PO TABS: ORAL_TABLET | ORAL | 0 refills | Status: DC

## 2018-07-23 MED ORDER — METHYLPREDNISOLONE ACETATE 80 MG/ML IJ SUSP
80.0000 mg | Freq: Once | INTRAMUSCULAR | Status: AC
  Administered 2018-07-23: 80 mg via INTRAMUSCULAR

## 2018-07-23 NOTE — Assessment & Plan Note (Signed)
stable overall by history and exam, recent data reviewed with pt, and pt to continue medical treatment as before,  to f/u any worsening symptoms or concerns  

## 2018-07-23 NOTE — Patient Instructions (Signed)
You had the steroid shot today  Please take all new medication as prescribed - the prednisone, but also the voltaren gel as needed for the hands, and the colchicine - 1 per day  Your A1c was OK today  Please continue all other medications as before, and refills have been done if requested.  Please have the pharmacy call with any other refills you may need.  Please continue your efforts at being more active, low cholesterol diet, and weight control.  Please keep your appointments with your specialists as you may have planned  Please return in 6 months, or sooner if needed, with Lab testing done 3-5 days before

## 2018-07-23 NOTE — Assessment & Plan Note (Signed)
Mild to mod, for volt gel prn,  to f/u any worsening symptoms or concerns 

## 2018-07-23 NOTE — Assessment & Plan Note (Addendum)
Ok for depomedrol IM 80, predpac as, colchcine 0.6 qd after, avoid nsaids with hx of GI bleeding  Note:  Total time for pt hx, exam, review of record with pt in the room, determination of diagnoses and plan for further eval and tx is > 40 min, with over 50% spent in coordination and counseling of patient including the differential dx, tx, further evaluation and other management of acute gouty arthritids, DM, hand OA, HTN, [name]
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
## 2018-07-23 NOTE — Assessment & Plan Note (Signed)
Lab Results  Component Value Date   HGBA1C 6.9 (A) 07/23/2018   HGBA1C 0 07/23/2018   HGBA1C 0 (A) 07/23/2018   HGBA1C 0.0 07/23/2018  stable overall by history and exam, recent data reviewed with pt, and pt to continue medical treatment as before,  to f/u any worsening symptoms or concerns

## 2018-07-24 ENCOUNTER — Telehealth: Payer: Self-pay

## 2018-07-24 MED ORDER — COLCHICINE 0.6 MG PO TABS: 0.6000 mg | ORAL_TABLET | Freq: Every day | ORAL | 3 refills | Status: DC

## 2018-07-24 MED ORDER — DICLOFENAC SODIUM 1 % TD GEL: 2.0000 g | Freq: Four times a day (QID) | TRANSDERMAL | 5 refills | Status: DC

## 2018-07-24 NOTE — Addendum Note (Signed)
Addended by: Biagio Borg on: 07/24/2018 05:08 PM   Modules accepted: Orders

## 2018-07-24 NOTE — Telephone Encounter (Signed)
Copied from [place] [id number]. Topic: Inquiry >> Jul 24, 2018  3:19 PM Pricilla Handler wrote: Reason for CRM: Patient's wife called stating that the pharmacy would not release the patient's Voltaren Gel and Colchicine that Dr. Jenny Reichmann prescribed on yesterday. After talking with Miss Gareth Eagle, it appears that the Voltaren Gel and Colchicine was never sent to the pharmacy. Please call patient's wife at [phone] or [phone].                      Thank You!!!

## 2018-07-24 NOTE — Telephone Encounter (Signed)
Sorry for this oversight on my part  I have now sent

## 2018-08-30 ENCOUNTER — Other Ambulatory Visit: Payer: Self-pay | Admitting: Internal Medicine

## 2018-11-05 ENCOUNTER — Other Ambulatory Visit: Payer: Self-pay | Admitting: Internal Medicine

## 2018-11-06 ENCOUNTER — Other Ambulatory Visit: Payer: Self-pay | Admitting: Internal Medicine

## 2018-11-06 ENCOUNTER — Ambulatory Visit (INDEPENDENT_AMBULATORY_CARE_PROVIDER_SITE_OTHER): Payer: Medicare Other | Admitting: Internal Medicine

## 2018-11-06 ENCOUNTER — Ambulatory Visit: Payer: Self-pay | Admitting: *Deleted

## 2018-11-06 ENCOUNTER — Encounter: Payer: Self-pay | Admitting: Internal Medicine

## 2018-11-06 VITALS — BP 128/86 | HR 74 | Temp 98.1°F | Ht 78.5 in | Wt 244.0 lb

## 2018-11-06 DIAGNOSIS — E782 Mixed hyperlipidemia: Secondary | ICD-10-CM | POA: Diagnosis not present

## 2018-11-06 DIAGNOSIS — M109 Gout, unspecified: Secondary | ICD-10-CM

## 2018-11-06 DIAGNOSIS — I10 Essential (primary) hypertension: Secondary | ICD-10-CM | POA: Diagnosis not present

## 2018-11-06 DIAGNOSIS — E119 Type 2 diabetes mellitus without complications: Secondary | ICD-10-CM

## 2018-11-06 MED ORDER — METHYLPREDNISOLONE ACETATE 80 MG/ML IJ SUSP
80.0000 mg | Freq: Once | INTRAMUSCULAR | Status: AC
  Administered 2018-11-06: 80 mg via INTRAMUSCULAR

## 2018-11-06 MED ORDER — PREDNISONE 10 MG PO TABS: ORAL_TABLET | ORAL | 0 refills | Status: DC

## 2018-11-06 MED ORDER — COLCHICINE 0.6 MG PO TABS: 0.6000 mg | ORAL_TABLET | Freq: Two times a day (BID) | ORAL | 3 refills | Status: DC
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
## 2018-11-06 NOTE — Telephone Encounter (Signed)
[place] for the 10 mg only

## 2018-11-06 NOTE — Telephone Encounter (Signed)
Summary: rx clarification   Pharmacy requesting clarification of pt's rx: amLODipine (NORVASC) 10 MG tablet or amLODipine (NORVASC) 5 MG tablet. They have received both and pt was unsure about which dosage was correct. Please verify.     [id number] [place], [place] - [address] [place] [address] Phone: [phone] Fax: [phone]     Question about dosing of amlodipine- per chart- patient has been taking 5 mg. Please review for pharmacy notification- patient was in office today and Rx was refilled at 10 mg.

## 2018-11-06 NOTE — Patient Instructions (Addendum)
You had the steroid shot today  Please take all new medication as prescribed - colchicine at twice per day  Please take all new medication as prescribed - the prednisone  We can consider allopurinol if this is not working well  Please continue all other medications as before, and refills have been done if requested.  Please have the pharmacy call with any other refills you may need.  Please continue your efforts at being more active, low cholesterol diet, and weight control  Please keep your appointments with your specialists as you may have planned

## 2018-11-06 NOTE — Addendum Note (Signed)
Addended by: Biagio Borg on: 11/06/2018 05:23 PM   Modules accepted: Orders

## 2018-11-07 NOTE — Telephone Encounter (Signed)
Pharmacy informed.

## 2018-11-09 NOTE — Assessment & Plan Note (Signed)
stable overall by history and exam, recent data reviewed with pt, and pt to continue medical treatment as before,  to f/u any worsening symptoms or concerns  

## 2018-11-09 NOTE — Assessment & Plan Note (Signed)
Mild to mod, for depomedrol IM 80, predpac asd, start colchicine asd,  to f/u any worsening symptoms or concerns

## 2018-11-21 ENCOUNTER — Telehealth: Payer: Self-pay | Admitting: Internal Medicine

## 2018-11-21 MED ORDER — ALLOPURINOL 100 MG PO TABS: 100.0000 mg | ORAL_TABLET | Freq: Every day | ORAL | 3 refills | Status: DC

## 2018-11-21 NOTE — Telephone Encounter (Signed)
Copied from [place] [id number]. Topic: Quick Communication - See Telephone Encounter >> Nov 21, 2018  8:07 AM Ivar Drape wrote: CRM for notification. See Telephone encounter for: 11/21/18. Patient's colchicine 0.6 MG tablet medication is not working on the gout in his left hand and he would like a small amount of Allopurinol called in for him and have it sent to his preferred pharmacy [facility] [address]

## 2018-11-21 NOTE — Telephone Encounter (Signed)
alloprinol done erx

## 2018-11-21 NOTE — Telephone Encounter (Signed)
Will route to office for final disposition; also see after visit summary dated 11/06/17 per Dr Cathlean Cower; attempted to contact the pt to verify pharmacy; unable to leave message on [phone]; left message on voicemail [phone].

## 2018-11-21 NOTE — Addendum Note (Signed)
Addended by: Biagio Borg on: 11/21/2018 12:44 PM   Modules accepted: Orders

## 2018-12-27 ENCOUNTER — Ambulatory Visit: Payer: Self-pay | Admitting: *Deleted

## 2018-12-27 NOTE — Telephone Encounter (Signed)
Message from Lennox Solders sent at 12/27/2018 12:04 PM EST   Summary: please advise   Pt wife [name] is calling and would like to know if they can increase colchicine or allopurinol. Pt is still having hand pain. Pt saw dr Jenny Reichmann on 11-06-18          Returned call to pt's wife who states that the pt is still experiencing pain in hands with taking Allopurinol 100 mg once a day and Colchicine 0.6 mg twice a day. Advised pt's wife that pt would need to come in to have symptoms evaluated. LOV on 11/06/18. Pt's wife verbalized understanding and appt scheduled on 12/28/18 with Dr. Jenny Reichmann.  Reason for Disposition . [1] MODERATE pain (e.g., interferes with normal activities) AND [2] present > 3 days  Answer Assessment - Initial Assessment Questions 1. ONSET: "When did the pain start?"     Has been having pain in hand since office visit on 11/06/18 with no improvement with medications 2. LOCATION: "Where is the pain located?"     Hand  Protocols used: HAND AND WRIST PAIN-A-AH

## 2018-12-28 ENCOUNTER — Encounter: Payer: Self-pay | Admitting: Internal Medicine

## 2018-12-28 ENCOUNTER — Ambulatory Visit (INDEPENDENT_AMBULATORY_CARE_PROVIDER_SITE_OTHER): Payer: Medicare Other | Admitting: Internal Medicine

## 2018-12-28 VITALS — BP 142/86 | HR 76 | Temp 97.8°F | Ht 78.5 in | Wt 241.0 lb

## 2018-12-28 DIAGNOSIS — M19049 Primary osteoarthritis, unspecified hand: Secondary | ICD-10-CM

## 2018-12-28 DIAGNOSIS — M109 Gout, unspecified: Secondary | ICD-10-CM

## 2018-12-28 DIAGNOSIS — I10 Essential (primary) hypertension: Secondary | ICD-10-CM

## 2018-12-28 DIAGNOSIS — M79642 Pain in left hand: Secondary | ICD-10-CM

## 2018-12-28 DIAGNOSIS — M79641 Pain in right hand: Secondary | ICD-10-CM | POA: Diagnosis not present

## 2018-12-28 MED ORDER — TRAMADOL HCL 50 MG PO TABS: 50.0000 mg | ORAL_TABLET | Freq: Four times a day (QID) | ORAL | 0 refills | Status: DC | PRN
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
## 2018-12-28 NOTE — Patient Instructions (Signed)
Ok to take the Tylenol ES 650 mg every 8 hrs as needed for pain  Please take all new medication as prescribed - the tramadol (and call in one week if you need further medication)  You will be contacted regarding the referral for: Hand Surgury  Please continue all other medications as before, and refills have been done if requested.  Please have the pharmacy call with any other refills you may need.  Please continue your efforts at being more active, low cholesterol diet, and weight control.  You are otherwise up to date with prevention measures today.  Please keep your appointments with your specialists as you may have planned

## 2018-12-30 ENCOUNTER — Encounter: Payer: Self-pay | Admitting: Internal Medicine

## 2018-12-30 ENCOUNTER — Other Ambulatory Visit: Payer: Self-pay | Admitting: Internal Medicine

## 2018-12-30 NOTE — Assessment & Plan Note (Signed)
Mod to severe worse recently pain with overuse, for tylenol 650 every 8 prn, tramadol prn, and refer Hand Surgeon Dr Amedeo Plenty per wife reqeust

## 2018-12-30 NOTE — Assessment & Plan Note (Signed)
Resolved, no need further prednisone for now, cont same tx

## 2018-12-30 NOTE — Assessment & Plan Note (Signed)
stable overall by history and exam, recent data reviewed with pt, and pt to continue medical treatment as before,  to f/u any worsening symptoms or concerns  

## 2018-12-31 ENCOUNTER — Other Ambulatory Visit (INDEPENDENT_AMBULATORY_CARE_PROVIDER_SITE_OTHER): Payer: Self-pay | Admitting: *Deleted

## 2018-12-31 ENCOUNTER — Encounter (INDEPENDENT_AMBULATORY_CARE_PROVIDER_SITE_OTHER): Payer: Self-pay | Admitting: Orthopaedic Surgery

## 2018-12-31 ENCOUNTER — Ambulatory Visit (INDEPENDENT_AMBULATORY_CARE_PROVIDER_SITE_OTHER): Payer: Medicare Other | Admitting: Orthopaedic Surgery

## 2018-12-31 VITALS — BP 154/89 | HR 83

## 2018-12-31 DIAGNOSIS — M79641 Pain in right hand: Secondary | ICD-10-CM

## 2018-12-31 DIAGNOSIS — G8929 Other chronic pain: Secondary | ICD-10-CM | POA: Diagnosis not present

## 2018-12-31 DIAGNOSIS — M545 Low back pain, unspecified: Secondary | ICD-10-CM

## 2018-12-31 DIAGNOSIS — M79642 Pain in left hand: Secondary | ICD-10-CM

## 2018-12-31 MED ORDER — LIDOCAINE HCL 1 % IJ SOLN
1.0000 mL | INTRAMUSCULAR | Status: AC | PRN
  Administered 2018-12-31: 1 mL

## 2018-12-31 MED ORDER — BUPIVACAINE HCL 0.5 % IJ SOLN
2.0000 mL | INTRAMUSCULAR | Status: AC | PRN
  Administered 2018-12-31: 2 mL

## 2018-12-31 MED ORDER — METHYLPREDNISOLONE ACETATE 40 MG/ML IJ SUSP
40.0000 mg | INTRAMUSCULAR | Status: AC | PRN
  Administered 2018-12-31: 40 mg via INTRAMUSCULAR
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
## 2019-01-04 ENCOUNTER — Telehealth (INDEPENDENT_AMBULATORY_CARE_PROVIDER_SITE_OTHER): Payer: Self-pay | Admitting: *Deleted

## 2019-01-07 NOTE — Telephone Encounter (Signed)
Please call patient to see if he can come to see Dr. Durward Fortes on 01/22/2019 for f/u after Dr. Ernestina Patches appt 01/15/2019. Thank you.

## 2019-01-14 ENCOUNTER — Telehealth: Payer: Self-pay | Admitting: Internal Medicine

## 2019-01-14 NOTE — Telephone Encounter (Signed)
Please clarify dose with pt, as he has a rx for the 10 mg, but not the 5.  He may be taking 10 twice per day, which could easily cause his symptoms

## 2019-01-14 NOTE — Telephone Encounter (Signed)
Copied from [place] [id number]. Topic: Quick Communication - Rx Refill/Question >> Jan 14, 2019  9:11 AM Margot Ables wrote: Medication: amLODipine (NORVASC) 5 MG tablet  - pt wife states that pt takes 5mg  dose every day - only sometimes pt will take a 2nd 5mg  in the evening - pt rarely takes 10mg  dose as it "makes him feel bad" making pt dizzy and slows him down, he doesn't feel like doing anything, per his wife.   Has the patient contacted their pharmacy? Yes - no RX for 5mg  on file  Preferred Pharmacy (with phone number or street name): [id number] [place], [place] - [address] [phone] (Phone) [phone] (Fax)

## 2019-01-15 ENCOUNTER — Ambulatory Visit (INDEPENDENT_AMBULATORY_CARE_PROVIDER_SITE_OTHER): Payer: Medicare Other | Admitting: Physical Medicine and Rehabilitation

## 2019-01-15 ENCOUNTER — Telehealth: Payer: Self-pay | Admitting: Internal Medicine

## 2019-01-15 ENCOUNTER — Ambulatory Visit (INDEPENDENT_AMBULATORY_CARE_PROVIDER_SITE_OTHER): Payer: Medicare Other | Admitting: Orthopaedic Surgery

## 2019-01-15 ENCOUNTER — Encounter (INDEPENDENT_AMBULATORY_CARE_PROVIDER_SITE_OTHER): Payer: Self-pay | Admitting: Physical Medicine and Rehabilitation

## 2019-01-15 DIAGNOSIS — R202 Paresthesia of skin: Secondary | ICD-10-CM | POA: Diagnosis not present

## 2019-01-15 NOTE — Telephone Encounter (Signed)
We dont do alternating days of medication, and this is not what was advised at last visit  I would stay with the 10 mg dose only

## 2019-01-15 NOTE — Telephone Encounter (Signed)
Copied from [place] [id number]. Topic: Quick Communication - Rx Refill/Question >> Jan 15, 2019  3:57 PM Alanda Slim E wrote: Medication: amLODipine (NORVASC) 5 MG tablet - (Pt likes to take 5mg  in the day and if needed he takes another 5mg  at night) rather have the 5mg  than the 10mg  / Pt still has some of the 10MG  left so he just needs the 5mg  Rx  Has the patient contacted their pharmacy? Yes (call PCP)    Preferred Pharmacy (with phone number or street name): [id number] - [place], [place] - [address] [phone] (Phone) [phone] (Fax)    Agent: Please be advised that RX refills may take up to 3 business days. We ask that you follow-up with your pharmacy.

## 2019-01-15 NOTE — Telephone Encounter (Signed)
Spoke to pt for clarification and he stated that PCP prescribed the 5mg  and 10mg  amlodipine due to his BP that fluctuates. He was told to alternate them each day depending on how his BP readings are. He stated that he needs a refill of both.  Sending this to PCP for clarification if this is how the pt is to take the medication. Please advise.

## 2019-01-15 NOTE — Progress Notes (Signed)
  Numeric Pain Rating Scale and Functional Assessment Average Pain 7   In the last MONTH (on 0-10 scale) has pain interfered with the following?  1. General activity like being  able to carry out your everyday physical activities such as walking, climbing stairs, carrying groceries, or moving a chair?  Rating(10)

## 2019-01-17 NOTE — Telephone Encounter (Signed)
Called pt, LVM.   

## 2019-01-18 NOTE — Telephone Encounter (Signed)
Amber from [facility] is calling to see why this refill was not appropriate. States the patient has 3 pills left. [phone]

## 2019-01-18 NOTE — Telephone Encounter (Signed)
Clarified medication, gave verbal for med. They never received 11.25.19 rx.

## 2019-01-21 NOTE — Procedures (Signed)
EMG & NCV Findings: Evaluation of the left median motor and the right median motor nerves showed prolonged distal onset latency (L7.8, R6.2 ms), reduced amplitude (L3.0, R3.8 mV), and decreased conduction velocity (Elbow-Wrist, L46, R38 m/s).  The left ulnar motor and the right ulnar motor nerves showed decreased conduction velocity (B Elbow-Wrist, L50, R46 m/s) and decreased conduction velocity (A Elbow-B Elbow, L50, R45 m/s).  The left median (across palm) sensory nerve showed no response (Wrist) and no response (Palm).  The right median (across palm) sensory nerve showed no response (Palm) and prolonged distal peak latency (5.4 ms).  The left ulnar sensory nerve showed prolonged distal peak latency (4.0 ms) and decreased conduction velocity (Wrist-5th Digit, 35 m/s).  The right ulnar sensory nerve showed prolonged distal peak latency (5.1 ms), reduced amplitude (4.7 V), and decreased conduction velocity (Wrist-5th Digit, 27 m/s).  All remaining nerves (as indicated in the following tables) were within normal limits.  Left vs. Right side comparison data for the median motor nerve indicates abnormal L-R latency difference (1.6 ms).  The ulnar sensory nerve indicates abnormal L-R latency difference (1.1 ms) and abnormal L-R amplitude difference (78.8 %).  All remaining left vs. right side differences were within normal limits.    All examined muscles (as indicated in the following table) showed no evidence of electrical instability.    Impression: The above electrodiagnostic study is ABNORMAL and reveals evidence of a severe bilateral median nerve entrapment at the wrist (carpal tunnel syndrome) affecting sensory and motor components.   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   There is no significant electrodiagnostic evidence of any other focal nerve entrapment, brachial plexopathy or cervical radiculopathy.    Recommendations: 1.  Follow-up with referring physician. 2.  Continue current management of symptoms. 3.  Suggest surgical evaluation.  ___________________________ Laurence Spates FAAPMR Board Certified, American Board of Physical Medicine and Rehabilitation    Nerve Conduction Studies Anti Sensory Summary Table   Stim Site NR Peak (ms) Norm Peak (ms) P-T Amp (V) Norm P-T Amp Site1 Site2 Delta-P (ms) Dist (cm) Vel (m/s) Norm Vel (m/s)  Left Median Acr Palm Anti Sensory (2nd Digit)  32.3C  Wrist *NR  <3.6  >10 Wrist Palm  0.0    Palm *NR  <2.0          Right Median Acr Palm Anti Sensory (2nd Digit)  31.5C  Wrist    *5.4 <3.6 11.0 >10 Wrist Palm  0.0    Palm *NR  <2.0          Left Radial Anti Sensory (Base 1st Digit)  34.9C  Wrist    0.8 <3.1 481.4  Wrist Base 1st Digit 0.8 0.0    Right Radial Anti Sensory (Base 1st Digit)  31.5C  Wrist    2.8 <3.1 7.7  Wrist Base 1st Digit 2.8 0.0    Left Ulnar Anti Sensory (5th Digit)  32.9C  Wrist    *4.0 <3.7 22.2 >15.0 Wrist 5th Digit 4.0 14.0 *35 >38  Right Ulnar Anti Sensory (5th Digit)  31.6C  Wrist    *5.1 <3.7 *4.7 >15.0 Wrist 5th Digit 5.1 14.0 *27 >38   Motor Summary Table   Stim Site NR Onset (ms) Norm Onset (ms) O-P Amp (mV) Norm O-P Amp Site1 Site2 Delta-0 (ms) Dist (cm) Vel (m/s) Norm Vel (m/s)  Left Median Motor (Abd Poll Brev)  34.3C  Wrist    *7.8 <4.2 *3.0 >  5 Elbow Wrist 5.4 25.0 *46 >50  Elbow    13.2  2.6         Right Median Motor (Abd Poll Brev)  31.6C  Wrist    *6.2 <4.2 *3.8 >5 Elbow Wrist 6.3 24.0 *38 >50  Elbow    12.5  3.7         Left Ulnar Motor (Abd Dig Min)  33.7C  Wrist    4.0 <4.2 8.4 >3 B Elbow Wrist 4.8 24.0 *50 >53  B Elbow    8.8  7.6  A Elbow B Elbow 1.9 9.5 *50 >53  A Elbow    10.7  7.2         Right Ulnar Motor (Abd Dig Min)  32.1C  Wrist    4.0 <4.2 8.1 >3 B Elbow Wrist 5.4 25.0 *46 >53  B Elbow    9.4  7.6  A Elbow B Elbow 2.2 10.0 *45 >53  A Elbow    11.6  7.1          EMG   Side  Muscle Nerve Root Ins Act Fibs Psw Amp Dur Poly Recrt Int Fraser Din Comment  Left Abd Poll Brev Median C8-T1 Nml Nml Nml Nml Nml 0 Nml Nml   Left 1stDorInt Ulnar C8-T1 Nml Nml Nml Nml Nml 0 Nml Nml   Left PronatorTeres Median C6-7 Nml Nml Nml Nml Nml 0 Nml Nml   Left Biceps Musculocut C5-6 Nml Nml Nml Nml Nml 0 Nml Nml   Left Deltoid Axillary C5-6 Nml Nml Nml Nml Nml 0 Nml Nml     Nerve Conduction Studies Anti Sensory Left/Right Comparison   Stim Site L Lat (ms) R Lat (ms) L-R Lat (ms) L Amp (V) R Amp (V) L-R Amp (%) Site1 Site2 L Vel (m/s) R Vel (m/s) L-R Vel (m/s)  Median Acr Palm Anti Sensory (2nd Digit)  32.3C  Wrist  *5.4   11.0  Wrist Palm     Palm             Radial Anti Sensory (Base 1st Digit)  34.9C  Wrist 0.8 2.8 2.0 481.4 7.7 98.4 Wrist Base 1st Digit     Ulnar Anti Sensory (5th Digit)  32.9C  Wrist *4.0 *5.1 *1.1 22.2 *4.7 *78.8 Wrist 5th Digit *35 *27 8   Motor Left/Right Comparison   Stim Site L Lat (ms) R Lat (ms) L-R Lat (ms) L Amp (mV) R Amp (mV) L-R Amp (%) Site1 Site2 L Vel (m/s) R Vel (m/s) L-R Vel (m/s)  Median Motor (Abd Poll Brev)  34.3C  Wrist *7.8 *6.2 *1.6 *3.0 *3.8 21.1 Elbow Wrist *46 *38 8  Elbow 13.2 12.5 0.7 2.6 3.7 29.7       Ulnar Motor (Abd Dig Min)  33.7C  Wrist 4.0 4.0 0.0 8.4 8.1 3.6 B Elbow Wrist *50 *46 4  B Elbow 8.8 9.4 0.6 7.6 7.6 0.0 A Elbow B Elbow *50 *45 5  A Elbow 10.7 11.6 0.9 7.2 7.1 1.4          Waveforms:
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
## 2019-01-22 ENCOUNTER — Encounter (INDEPENDENT_AMBULATORY_CARE_PROVIDER_SITE_OTHER): Payer: Self-pay | Admitting: Orthopaedic Surgery

## 2019-01-22 ENCOUNTER — Ambulatory Visit (INDEPENDENT_AMBULATORY_CARE_PROVIDER_SITE_OTHER): Payer: Medicare Other | Admitting: Orthopaedic Surgery

## 2019-01-22 VITALS — BP 133/77 | HR 85 | Ht 78.0 in | Wt 227.0 lb

## 2019-01-22 DIAGNOSIS — G5603 Carpal tunnel syndrome, bilateral upper limbs: Secondary | ICD-10-CM | POA: Insufficient documentation
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
## 2019-01-24 ENCOUNTER — Encounter: Payer: Self-pay | Admitting: Internal Medicine

## 2019-01-24 ENCOUNTER — Ambulatory Visit (INDEPENDENT_AMBULATORY_CARE_PROVIDER_SITE_OTHER): Payer: Medicare Other | Admitting: Internal Medicine

## 2019-01-24 ENCOUNTER — Other Ambulatory Visit (INDEPENDENT_AMBULATORY_CARE_PROVIDER_SITE_OTHER): Payer: Medicare Other

## 2019-01-24 VITALS — BP 138/78 | HR 84 | Temp 97.6°F | Wt 240.0 lb

## 2019-01-24 DIAGNOSIS — M109 Gout, unspecified: Secondary | ICD-10-CM | POA: Diagnosis not present

## 2019-01-24 DIAGNOSIS — G5603 Carpal tunnel syndrome, bilateral upper limbs: Secondary | ICD-10-CM

## 2019-01-24 DIAGNOSIS — Z0000 Encounter for general adult medical examination without abnormal findings: Secondary | ICD-10-CM | POA: Diagnosis not present

## 2019-01-24 DIAGNOSIS — E119 Type 2 diabetes mellitus without complications: Secondary | ICD-10-CM | POA: Diagnosis not present

## 2019-01-24 LAB — URINALYSIS, ROUTINE W REFLEX MICROSCOPIC
Bilirubin Urine: NEGATIVE
Hgb urine dipstick: NEGATIVE
Ketones, ur: NEGATIVE
Leukocytes,Ua: NEGATIVE
Nitrite: NEGATIVE
RBC / HPF: NONE SEEN (ref 0–?)
Specific Gravity, Urine: 1.01 (ref 1.000–1.030)
Total Protein, Urine: NEGATIVE
Urine Glucose: NEGATIVE
Urobilinogen, UA: 0.2 (ref 0.0–1.0)
WBC, UA: NONE SEEN (ref 0–?)
pH: 5.5 (ref 5.0–8.0)

## 2019-01-24 LAB — CBC WITH DIFFERENTIAL/PLATELET
Basophils Absolute: 0.1 10*3/uL (ref 0.0–0.1)
Basophils Relative: 1.1 % (ref 0.0–3.0)
Eosinophils Absolute: 0.3 10*3/uL (ref 0.0–0.7)
Eosinophils Relative: 4 % (ref 0.0–5.0)
HCT: 43.6 % (ref 39.0–52.0)
Hemoglobin: 14.5 g/dL (ref 13.0–17.0)
Lymphocytes Relative: 18.5 % (ref 12.0–46.0)
Lymphs Abs: 1.5 10*3/uL (ref 0.7–4.0)
MCHC: 33.3 g/dL (ref 30.0–36.0)
MCV: 84.6 fl (ref 78.0–100.0)
Monocytes Absolute: 1.2 10*3/uL — ABNORMAL HIGH (ref 0.1–1.0)
Monocytes Relative: 14.7 % — ABNORMAL HIGH (ref 3.0–12.0)
Neutro Abs: 5.1 10*3/uL (ref 1.4–7.7)
Neutrophils Relative %: 61.7 % (ref 43.0–77.0)
Platelets: 266 10*3/uL (ref 150.0–400.0)
RBC: 5.15 Mil/uL (ref 4.22–5.81)
RDW: 17.7 % — AB (ref 11.5–15.5)
WBC: 8.2 10*3/uL (ref 4.0–10.5)

## 2019-01-24 LAB — HEPATIC FUNCTION PANEL
ALT: 79 U/L — ABNORMAL HIGH (ref 0–53)
AST: 71 U/L — ABNORMAL HIGH (ref 0–37)
Albumin: 4.7 g/dL (ref 3.5–5.2)
Alkaline Phosphatase: 150 U/L — ABNORMAL HIGH (ref 39–117)
BILIRUBIN DIRECT: 0.2 mg/dL (ref 0.0–0.3)
Total Bilirubin: 0.7 mg/dL (ref 0.2–1.2)
Total Protein: 7.2 g/dL (ref 6.0–8.3)

## 2019-01-24 LAB — LIPID PANEL
Cholesterol: 147 mg/dL (ref 0–200)
HDL: 43 mg/dL (ref >39.00)
LDL Cholesterol: 80 mg/dL (ref 0–99)
NonHDL: 103.87
Total CHOL/HDL Ratio: 3
Triglycerides: 117 mg/dL (ref 0.0–149.0)
VLDL: 23.4 mg/dL (ref 0.0–40.0)

## 2019-01-24 LAB — HEMOGLOBIN A1C: Hgb A1c MFr Bld: 7.5 % — ABNORMAL HIGH (ref 4.6–6.5)

## 2019-01-24 LAB — BASIC METABOLIC PANEL
BUN: 37 mg/dL — ABNORMAL HIGH (ref 6–23)
CO2: 26 mEq/L (ref 19–32)
Calcium: 9.8 mg/dL (ref 8.4–10.5)
Chloride: 101 mEq/L (ref 96–112)
Creatinine, Ser: 1.94 mg/dL — ABNORMAL HIGH (ref 0.40–1.50)
GFR: 33.41 mL/min — ABNORMAL LOW (ref >60.00)
Glucose, Bld: 145 mg/dL — ABNORMAL HIGH (ref 70–99)
POTASSIUM: 3.7 meq/L (ref 3.5–5.1)
Sodium: 140 mEq/L (ref 135–145)

## 2019-01-24 LAB — MICROALBUMIN / CREATININE URINE RATIO
Creatinine,U: 92.3 mg/dL
Microalb Creat Ratio: 5.2 mg/g (ref 0.0–30.0)
Microalb, Ur: 4.8 mg/dL — ABNORMAL HIGH (ref 0.0–1.9)

## 2019-01-24 LAB — TSH: TSH: 1.17 u[IU]/mL (ref 0.35–4.50)

## 2019-01-24 NOTE — Patient Instructions (Signed)
OK to stay off the allopurinol for now  Please continue all other medications as before, and refills have been done if requested.  Please have the pharmacy call with any other refills you may need.  Please continue your efforts at being more active, low cholesterol diet, and weight control.  You are otherwise up to date with prevention measures today.  Please keep your appointments with your specialists as you may have planned  Please go to the LAB in the Basement (turn left off the elevator) for the tests to be done today  You will be contacted by phone if any changes need to be made immediately.  Otherwise, you will receive a letter about your results with an explanation, but please check with MyChart first.  Please remember to sign up for MyChart if you have not done so, as this will be important to you in the future with finding out test results, communicating by private email, and scheduling acute appointments online when needed.  Please return in 6 months, or sooner if needed, with Lab testing done 3-5 days before
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
## 2019-01-25 ENCOUNTER — Encounter: Payer: Self-pay | Admitting: Internal Medicine

## 2019-01-25 ENCOUNTER — Other Ambulatory Visit: Payer: Self-pay | Admitting: Internal Medicine

## 2019-01-25 DIAGNOSIS — N289 Disorder of kidney and ureter, unspecified: Secondary | ICD-10-CM

## 2019-01-28 ENCOUNTER — Encounter: Payer: Self-pay | Admitting: Internal Medicine

## 2019-01-28 ENCOUNTER — Other Ambulatory Visit (INDEPENDENT_AMBULATORY_CARE_PROVIDER_SITE_OTHER): Payer: Medicare Other

## 2019-01-28 DIAGNOSIS — M109 Gout, unspecified: Secondary | ICD-10-CM

## 2019-01-28 DIAGNOSIS — E119 Type 2 diabetes mellitus without complications: Secondary | ICD-10-CM | POA: Diagnosis not present

## 2019-01-28 LAB — URINALYSIS, ROUTINE W REFLEX MICROSCOPIC
Bilirubin Urine: NEGATIVE
Hgb urine dipstick: NEGATIVE
Ketones, ur: NEGATIVE
Leukocytes,Ua: NEGATIVE
Nitrite: NEGATIVE
RBC / HPF: NONE SEEN (ref 0–?)
Specific Gravity, Urine: 1.015 (ref 1.000–1.030)
Total Protein, Urine: 30 — AB
Urine Glucose: NEGATIVE
Urobilinogen, UA: 0.2 (ref 0.0–1.0)
pH: 5.5 (ref 5.0–8.0)

## 2019-01-28 LAB — CBC WITH DIFFERENTIAL/PLATELET
BASOS ABS: 0.1 10*3/uL (ref 0.0–0.1)
BASOS PCT: 1.1 % (ref 0.0–3.0)
EOS ABS: 0.6 10*3/uL (ref 0.0–0.7)
Eosinophils Relative: 8.1 % — ABNORMAL HIGH (ref 0.0–5.0)
HCT: 43.1 % (ref 39.0–52.0)
Hemoglobin: 14.4 g/dL (ref 13.0–17.0)
Lymphocytes Relative: 18.2 % (ref 12.0–46.0)
Lymphs Abs: 1.4 10*3/uL (ref 0.7–4.0)
MCHC: 33.4 g/dL (ref 30.0–36.0)
MCV: 85.6 fl (ref 78.0–100.0)
Monocytes Absolute: 0.9 10*3/uL (ref 0.1–1.0)
Monocytes Relative: 11.4 % (ref 3.0–12.0)
Neutro Abs: 4.8 10*3/uL (ref 1.4–7.7)
Neutrophils Relative %: 61.2 % (ref 43.0–77.0)
PLATELETS: 227 10*3/uL (ref 150.0–400.0)
RBC: 5.04 Mil/uL (ref 4.22–5.81)
RDW: 17.8 % — AB (ref 11.5–15.5)
WBC: 7.9 10*3/uL (ref 4.0–10.5)

## 2019-01-28 LAB — BASIC METABOLIC PANEL
BUN: 23 mg/dL (ref 6–23)
CALCIUM: 9.6 mg/dL (ref 8.4–10.5)
CO2: 26 mEq/L (ref 19–32)
Chloride: 106 mEq/L (ref 96–112)
Creatinine, Ser: 1.28 mg/dL (ref 0.40–1.50)
GFR: 53.98 mL/min — ABNORMAL LOW (ref >60.00)
Glucose, Bld: 120 mg/dL — ABNORMAL HIGH (ref 70–99)
Potassium: 4.3 mEq/L (ref 3.5–5.1)
Sodium: 142 mEq/L (ref 135–145)

## 2019-01-28 LAB — LIPID PANEL
Cholesterol: 133 mg/dL (ref 0–200)
HDL: 42.5 mg/dL (ref >39.00)
LDL Cholesterol: 70 mg/dL (ref 0–99)
NonHDL: 90.01
Total CHOL/HDL Ratio: 3
Triglycerides: 99 mg/dL (ref 0.0–149.0)
VLDL: 19.8 mg/dL (ref 0.0–40.0)

## 2019-01-28 LAB — HEPATIC FUNCTION PANEL
ALK PHOS: 137 U/L — AB (ref 39–117)
ALT: 54 U/L — ABNORMAL HIGH (ref 0–53)
AST: 35 U/L (ref 0–37)
Albumin: 4.3 g/dL (ref 3.5–5.2)
Bilirubin, Direct: 0.1 mg/dL (ref 0.0–0.3)
Total Bilirubin: 0.6 mg/dL (ref 0.2–1.2)
Total Protein: 6.5 g/dL (ref 6.0–8.3)

## 2019-01-28 LAB — TSH: TSH: 1.29 u[IU]/mL (ref 0.35–4.50)

## 2019-01-28 LAB — MICROALBUMIN / CREATININE URINE RATIO
CREATININE, U: 92.9 mg/dL
Microalb Creat Ratio: 27.3 mg/g (ref 0.0–30.0)
Microalb, Ur: 25.4 mg/dL — ABNORMAL HIGH (ref 0.0–1.9)

## 2019-01-28 LAB — HEMOGLOBIN A1C: Hgb A1c MFr Bld: 7.4 % — ABNORMAL HIGH (ref 4.6–6.5)

## 2019-01-28 LAB — URIC ACID: URIC ACID, SERUM: 8.7 mg/dL — AB (ref 4.0–7.8)

## 2019-02-01 ENCOUNTER — Telehealth (INDEPENDENT_AMBULATORY_CARE_PROVIDER_SITE_OTHER): Payer: Self-pay | Admitting: Orthopaedic Surgery

## 2019-02-01 NOTE — Telephone Encounter (Signed)
Patients wife calling to find out when patient will be receiving a call to schedule CTR. Please call to advise.

## 2019-02-07 ENCOUNTER — Ambulatory Visit: Payer: Medicare Other | Admitting: Rheumatology

## 2019-02-07 ENCOUNTER — Other Ambulatory Visit (INDEPENDENT_AMBULATORY_CARE_PROVIDER_SITE_OTHER): Payer: Self-pay | Admitting: Orthopedic Surgery

## 2019-02-07 DIAGNOSIS — G5602 Carpal tunnel syndrome, left upper limb: Secondary | ICD-10-CM | POA: Diagnosis not present

## 2019-02-07 MED ORDER — HYDROCODONE-ACETAMINOPHEN 5-325 MG PO TABS: 1.0000 | ORAL_TABLET | Freq: Four times a day (QID) | ORAL | 0 refills | Status: DC | PRN

## 2019-02-07 NOTE — Progress Notes (Unsigned)
hy

## 2019-02-08 ENCOUNTER — Other Ambulatory Visit: Payer: Self-pay | Admitting: Internal Medicine

## 2019-02-08 NOTE — Telephone Encounter (Signed)
Done erx 

## 2019-02-09 ENCOUNTER — Other Ambulatory Visit: Payer: Self-pay | Admitting: Gastroenterology

## 2019-02-12 ENCOUNTER — Ambulatory Visit (INDEPENDENT_AMBULATORY_CARE_PROVIDER_SITE_OTHER): Payer: Medicare Other | Admitting: Orthopaedic Surgery

## 2019-02-12 ENCOUNTER — Encounter (INDEPENDENT_AMBULATORY_CARE_PROVIDER_SITE_OTHER): Payer: Self-pay | Admitting: Orthopaedic Surgery

## 2019-02-12 DIAGNOSIS — G5602 Carpal tunnel syndrome, left upper limb: Secondary | ICD-10-CM | POA: Diagnosis not present
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
## 2019-02-13 ENCOUNTER — Inpatient Hospital Stay (INDEPENDENT_AMBULATORY_CARE_PROVIDER_SITE_OTHER): Payer: Medicare Other | Admitting: Orthopaedic Surgery

## 2019-02-21 ENCOUNTER — Other Ambulatory Visit: Payer: Self-pay

## 2019-02-21 ENCOUNTER — Ambulatory Visit (INDEPENDENT_AMBULATORY_CARE_PROVIDER_SITE_OTHER): Payer: Medicare Other | Admitting: Orthopaedic Surgery

## 2019-02-21 ENCOUNTER — Encounter (INDEPENDENT_AMBULATORY_CARE_PROVIDER_SITE_OTHER): Payer: Self-pay | Admitting: Orthopaedic Surgery

## 2019-02-21 VITALS — BP 145/79 | HR 78 | Ht 78.0 in | Wt 240.0 lb

## 2019-02-21 DIAGNOSIS — G5603 Carpal tunnel syndrome, bilateral upper limbs: Secondary | ICD-10-CM
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
## 2019-03-08 ENCOUNTER — Ambulatory Visit: Payer: Medicare Other | Admitting: Rheumatology

## 2019-03-21 ENCOUNTER — Other Ambulatory Visit: Payer: Self-pay

## 2019-03-21 ENCOUNTER — Ambulatory Visit (INDEPENDENT_AMBULATORY_CARE_PROVIDER_SITE_OTHER): Payer: Medicare Other | Admitting: Orthopaedic Surgery

## 2019-03-21 ENCOUNTER — Ambulatory Visit (INDEPENDENT_AMBULATORY_CARE_PROVIDER_SITE_OTHER): Payer: Medicare Other

## 2019-03-21 ENCOUNTER — Encounter (INDEPENDENT_AMBULATORY_CARE_PROVIDER_SITE_OTHER): Payer: Self-pay

## 2019-03-21 ENCOUNTER — Encounter (INDEPENDENT_AMBULATORY_CARE_PROVIDER_SITE_OTHER): Payer: Self-pay | Admitting: Orthopaedic Surgery

## 2019-03-21 VITALS — BP 145/84 | HR 84 | Ht 79.0 in | Wt 240.0 lb

## 2019-03-21 DIAGNOSIS — M79642 Pain in left hand: Secondary | ICD-10-CM
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
## 2019-03-25 ENCOUNTER — Telehealth: Payer: Self-pay | Admitting: Internal Medicine

## 2019-03-25 NOTE — Telephone Encounter (Signed)
Pt's. wife left vm on Refill line; reported pt has a flare-up of gout, and requested refill on the Allopurinol.  Noted that Allopurinol was reordered 11/21/18; #90; RF x 3.  Returned call to wife.  Advised that there should be refills available.  Stated the pharmacy advised she would need to contact his doctor for a new order.   Wife reported he fell recently, and injured his left hand; had it evaluated by Orthopedics, and it was determined he sprained it.  Wife reported that his gout symptoms flared a few days after the injury.  Stated the left hand from level of 2nd knuckles up toward the wrist is red, warm, painful.  Stated he had a few Colchicine and Allopurinol left, and has "some improvement in the heat in his hand."   Is asking to have the Allopurinol refilled.  Advised will send this to Dr. Jenny Reichmann for his recommendation.  Agreed with plan.

## 2019-03-26 MED ORDER — ALLOPURINOL 100 MG PO TABS: 100.0000 mg | ORAL_TABLET | Freq: Every day | ORAL | 1 refills | Status: DC

## 2019-03-26 NOTE — Addendum Note (Signed)
Addended by: Juliet Rude on: 03/26/2019 07:51 AM   Modules accepted: Orders

## 2019-03-29 ENCOUNTER — Ambulatory Visit (INDEPENDENT_AMBULATORY_CARE_PROVIDER_SITE_OTHER): Payer: Medicare Other | Admitting: Internal Medicine

## 2019-03-29 DIAGNOSIS — M109 Gout, unspecified: Secondary | ICD-10-CM

## 2019-03-29 DIAGNOSIS — E119 Type 2 diabetes mellitus without complications: Secondary | ICD-10-CM

## 2019-03-29 DIAGNOSIS — L03115 Cellulitis of right lower limb: Secondary | ICD-10-CM

## 2019-03-29 MED ORDER — DOXYCYCLINE HYCLATE 100 MG PO TABS: 100.0000 mg | ORAL_TABLET | Freq: Two times a day (BID) | ORAL | 0 refills | Status: DC
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
## 2019-03-30 ENCOUNTER — Encounter: Payer: Self-pay | Admitting: Internal Medicine

## 2019-03-30 DIAGNOSIS — L03115 Cellulitis of right lower limb: Secondary | ICD-10-CM | POA: Insufficient documentation

## 2019-03-30 NOTE — Patient Instructions (Signed)
Please take all new medication as prescribed - the antbiotic  Please continue all other medications as before, and refills have been done if requested.  Please have the pharmacy call with any other refills you may need.  Please continue your efforts at being more active, low cholesterol diet, and weight control.  You are otherwise up to date with prevention measures today.  Please keep your appointments with your specialists as you may have planned  Please go to ED for any worsening pain, redness, swelling or fever, chills, falls or other unusual symptoms

## 2019-03-30 NOTE — Assessment & Plan Note (Signed)
Resolved, no stable overall by history and exam, recent data reviewed with pt, and pt to continue medical treatment as before,  to f/u any worsening symptoms or concerns

## 2019-03-30 NOTE — Assessment & Plan Note (Signed)
Mild to mod, for antibx course,  to f/u any worsening symptoms or concerns at ED if worsening

## 2019-03-30 NOTE — Assessment & Plan Note (Signed)
stable overall by history and exam, recent data reviewed with pt, and pt to continue medical treatment as before,  to f/u any worsening symptoms or concerns  

## 2019-05-05 ENCOUNTER — Other Ambulatory Visit: Payer: Self-pay | Admitting: Internal Medicine

## 2019-05-06 ENCOUNTER — Other Ambulatory Visit: Payer: Self-pay | Admitting: Internal Medicine

## 2019-06-10 ENCOUNTER — Other Ambulatory Visit: Payer: Self-pay | Admitting: Internal Medicine

## 2019-06-25 ENCOUNTER — Telehealth: Payer: Self-pay | Admitting: Internal Medicine

## 2019-06-25 NOTE — Telephone Encounter (Signed)
Spoke with Mrs. Hair regarding AWV. Mrs. Arcos stated that  Mr. Azua is not interested in scheduling an appointment at this time. SF

## 2019-07-01 ENCOUNTER — Telehealth: Payer: Self-pay | Admitting: Internal Medicine

## 2019-07-01 MED ORDER — AMLODIPINE BESYLATE 10 MG PO TABS: 10.0000 mg | ORAL_TABLET | Freq: Every day | ORAL | 0 refills | Status: DC

## 2019-07-01 NOTE — Telephone Encounter (Signed)
Patient and his wife called regarding this medication refill. He has been taking one tablet a day and then a half a tablet as needed when it BP is high (he has been doing this for years). He has 4 tablets left. They would like to know if this could be refilled to get him through until his appointment in August.  She is unable to do a virtual visit because their server is down at home. Please advise.

## 2019-07-01 NOTE — Addendum Note (Signed)
Addended by: Juliet Rude on: 07/01/2019 09:52 AM   Modules accepted: Orders

## 2019-07-25 ENCOUNTER — Other Ambulatory Visit: Payer: Self-pay

## 2019-07-25 ENCOUNTER — Encounter: Payer: Self-pay | Admitting: Internal Medicine

## 2019-07-25 ENCOUNTER — Ambulatory Visit (INDEPENDENT_AMBULATORY_CARE_PROVIDER_SITE_OTHER): Payer: Medicare Other | Admitting: Internal Medicine

## 2019-07-25 VITALS — BP 126/82 | HR 81 | Temp 97.9°F | Ht 79.0 in | Wt 226.0 lb

## 2019-07-25 DIAGNOSIS — E782 Mixed hyperlipidemia: Secondary | ICD-10-CM

## 2019-07-25 DIAGNOSIS — E119 Type 2 diabetes mellitus without complications: Secondary | ICD-10-CM

## 2019-07-25 DIAGNOSIS — N183 Chronic kidney disease, stage 3 unspecified: Secondary | ICD-10-CM

## 2019-07-25 DIAGNOSIS — E538 Deficiency of other specified B group vitamins: Secondary | ICD-10-CM

## 2019-07-25 DIAGNOSIS — E611 Iron deficiency: Secondary | ICD-10-CM

## 2019-07-25 DIAGNOSIS — Z0000 Encounter for general adult medical examination without abnormal findings: Secondary | ICD-10-CM

## 2019-07-25 DIAGNOSIS — I10 Essential (primary) hypertension: Secondary | ICD-10-CM

## 2019-07-25 DIAGNOSIS — E559 Vitamin D deficiency, unspecified: Secondary | ICD-10-CM

## 2019-07-25 LAB — POCT GLYCOSYLATED HEMOGLOBIN (HGB A1C): Hemoglobin A1C: 6.4 % — AB (ref 4.0–5.6)
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
## 2019-07-25 NOTE — Patient Instructions (Addendum)
Your A1c was OK today  Please continue all other medications as before, and refills have been done if requested.  Please have the pharmacy call with any other refills you may need.  Please continue your efforts at being more active, low cholesterol diet, and weight control.  Please keep your appointments with your specialists as you may have planned  Please return in 6 months, or sooner if needed 

## 2019-07-27 ENCOUNTER — Encounter: Payer: Self-pay | Admitting: Internal Medicine

## 2019-07-27 NOTE — Assessment & Plan Note (Signed)
stable overall by history and exam, recent data reviewed with pt, and pt to continue medical treatment as before,  to f/u any worsening symptoms or concerns  

## 2019-08-02 ENCOUNTER — Other Ambulatory Visit: Payer: Self-pay | Admitting: Internal Medicine

## 2019-08-06 ENCOUNTER — Encounter: Payer: Self-pay | Admitting: Orthopaedic Surgery

## 2019-08-06 ENCOUNTER — Other Ambulatory Visit: Payer: Self-pay

## 2019-08-06 ENCOUNTER — Ambulatory Visit (INDEPENDENT_AMBULATORY_CARE_PROVIDER_SITE_OTHER): Payer: Medicare Other | Admitting: Orthopaedic Surgery

## 2019-08-06 VITALS — BP 151/78 | HR 78 | Resp 18 | Ht 78.5 in | Wt 230.0 lb

## 2019-08-06 DIAGNOSIS — G8929 Other chronic pain: Secondary | ICD-10-CM

## 2019-08-06 DIAGNOSIS — M545 Low back pain: Secondary | ICD-10-CM

## 2019-08-06 MED ORDER — METHYLPREDNISOLONE ACETATE 40 MG/ML IJ SUSP
40.0000 mg | INTRAMUSCULAR | Status: AC | PRN
  Administered 2019-08-06: 12:00:00 40 mg via INTRAMUSCULAR

## 2019-08-06 MED ORDER — BUPIVACAINE HCL 0.5 % IJ SOLN
2.0000 mL | INTRAMUSCULAR | Status: AC | PRN
  Administered 2019-08-06: 2 mL

## 2019-08-06 MED ORDER — LIDOCAINE HCL 1 % IJ SOLN
2.0000 mL | INTRAMUSCULAR | Status: AC | PRN
  Administered 2019-08-06: 12:00:00 2 mL
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
## 2019-08-12 ENCOUNTER — Telehealth: Payer: Self-pay | Admitting: Orthopaedic Surgery

## 2019-08-12 ENCOUNTER — Other Ambulatory Visit: Payer: Self-pay | Admitting: Orthopaedic Surgery

## 2019-08-12 ENCOUNTER — Other Ambulatory Visit: Payer: Self-pay | Admitting: Dermatology

## 2019-08-12 DIAGNOSIS — C44311 Basal cell carcinoma of skin of nose: Secondary | ICD-10-CM | POA: Diagnosis not present

## 2019-08-12 DIAGNOSIS — C44222 Squamous cell carcinoma of skin of right ear and external auricular canal: Secondary | ICD-10-CM | POA: Diagnosis not present

## 2019-08-12 DIAGNOSIS — C4402 Squamous cell carcinoma of skin of lip: Secondary | ICD-10-CM | POA: Diagnosis not present

## 2019-08-12 DIAGNOSIS — G8929 Other chronic pain: Secondary | ICD-10-CM

## 2019-08-12 NOTE — Telephone Encounter (Signed)
I would suggest another ESI has spinal stenosis from prior MRI

## 2019-08-12 NOTE — Telephone Encounter (Signed)
Spoke with patient's wife. She was very excited to hear about the ESI as an option. I have entered the order and advised that Dr.Newton's clinic will call to schedule.

## 2019-08-12 NOTE — Telephone Encounter (Signed)
Please advise 

## 2019-08-12 NOTE — Telephone Encounter (Signed)
[name] called stating [name] had an injection on 08/06/19 and is still having pain.  [name] states it is a shooting pain and is not sure how to proceed next.  [name] states that [name] wants another injection, but is aware that it is too soon.   Please advise.

## 2019-08-26 ENCOUNTER — Other Ambulatory Visit: Payer: Self-pay | Admitting: Internal Medicine

## 2019-08-26 MED ORDER — AMLODIPINE BESYLATE 10 MG PO TABS: 10.0000 mg | ORAL_TABLET | Freq: Every day | ORAL | 1 refills | Status: DC

## 2019-08-26 NOTE — Telephone Encounter (Signed)
Medication Refill - Medication: amLODipine (NORVASC) 10 MG tablet 90 day supply      Has the patient contacted their pharmacy? Yes.   (Agent: If no, request that the patient contact the pharmacy for the refill.) (Agent: If yes, when and what did the pharmacy advise?)  Preferred Pharmacy (with phone number or street name): [facility] [address]   Agent: Please be advised that RX refills may take up to 3 business days. We ask that you follow-up with your pharmacy.

## 2019-08-26 NOTE — Telephone Encounter (Signed)
Approved per protocol. Provided one refill to reach next OV.

## 2019-08-28 ENCOUNTER — Other Ambulatory Visit: Payer: Self-pay | Admitting: Internal Medicine

## 2019-08-29 ENCOUNTER — Ambulatory Visit (INDEPENDENT_AMBULATORY_CARE_PROVIDER_SITE_OTHER): Payer: Medicare Other | Admitting: Physical Medicine and Rehabilitation

## 2019-08-29 ENCOUNTER — Ambulatory Visit: Payer: Self-pay

## 2019-08-29 ENCOUNTER — Encounter: Payer: Self-pay | Admitting: Physical Medicine and Rehabilitation

## 2019-08-29 VITALS — BP 133/87 | HR 100

## 2019-08-29 DIAGNOSIS — M5416 Radiculopathy, lumbar region: Secondary | ICD-10-CM

## 2019-08-29 MED ORDER — METHYLPREDNISOLONE ACETATE 80 MG/ML IJ SUSP
80.0000 mg | Freq: Once | INTRAMUSCULAR | Status: AC
  Administered 2019-08-29: 80 mg

## 2019-08-29 NOTE — Progress Notes (Signed)
 .  Numeric Pain Rating Scale and Functional Assessment Average Pain 6   In the last MONTH (on 0-10 scale) has pain interfered with the following?  1. General activity like being  able to carry out your everyday physical activities such as walking, climbing stairs, carrying groceries, or moving a chair?  Rating(5)   +Driver, -BT, +Dye Allergies(Shellfish allergy).

## 2019-09-16 NOTE — Procedures (Signed)
Lumbar Epidural Steroid Injection - Interlaminar Approach with Fluoroscopic Guidance  Patient: [name]      Date of Birth: [birth year] MRN: [id number] PCP: Biagio Borg, MD      Visit Date: 08/29/2019   Universal Protocol:     Consent Given By: the patient  Position: PRONE  Additional Comments: Vital signs were monitored before and after the procedure. Patient was prepped and draped in the usual sterile fashion. The correct patient, procedure, and site was verified.   Injection Procedure Details:  Procedure Site One Meds Administered:  Meds ordered this encounter  Medications  . methylPREDNISolone acetate (DEPO-MEDROL) injection 80 mg     Laterality: Left  Location/Site:  L4-L5  Needle size: 20 G  Needle type: Tuohy  Needle Placement: Paramedian epidural  Findings:   -Comments: Excellent flow of contrast into the epidural space.  Procedure Details: Using a paramedian approach from the side mentioned above, the region overlying the inferior lamina was localized under fluoroscopic visualization and the soft tissues overlying this structure were infiltrated with 4 ml. of 1% Lidocaine without Epinephrine. The Tuohy needle was inserted into the epidural space using a paramedian approach.   The epidural space was localized using loss of resistance along with lateral and bi-planar fluoroscopic views.  After negative aspirate for air, blood, and CSF, a 2 ml. volume of Isovue-250 was injected into the epidural space and the flow of contrast was observed. Radiographs were obtained for documentation purposes.    The injectate was administered into the level noted above.   Additional Comments:  The patient tolerated the procedure well Dressing: 2 x 2 sterile gauze and Band-Aid    Post-procedure details: Patient was observed during the procedure. Post-procedure instructions were reviewed.  Patient left the clinic in stable condition.

## 2019-09-16 NOTE — Progress Notes (Signed)
[name] - 83 y.o. male MRN [id number]  Date of birth: 10-25-1936  Office Visit Note: Visit Date: 08/29/2019 PCP: Biagio Borg, MD Referred by: Biagio Borg, MD  Subjective: Chief Complaint  Patient presents with  . Lower Back - Pain  . Left Leg - Pain   HPI:  [name] is a 83 y.o. male who comes in today At the request of Dr. Joni Fears for right L4-5 interlaminar epidural steroid injection.  Patient's had a history of chronic back pain for many years.  Off and on we had seen him in the past for injection which my memory says were not very helpful.  The patient also says injections of really never helped him.  He has MRI from 2018 showed moderate multifactorial stenosis at L3-4 with multilevel facet arthropathy without specific nerve compression.  He has been seeing Dr. Durward Fortes recently over the last several months with right-sided hip and leg pain but tells me today it is left-sided hip and leg pain.  We saw him in February complete left diagnostically of the hand showing carpal tunnel problems.  We will complete diagnostic of a therapeutic right L4-5 interlaminar injection today.  We hope this gives him some relief.  ROS Otherwise per HPI.  Assessment & Plan: Visit Diagnoses:  1. Lumbar radiculopathy     Plan: No additional findings.   Meds & Orders:  Meds ordered this encounter  Medications  . methylPREDNISolone acetate (DEPO-MEDROL) injection 80 mg    Orders Placed This Encounter  Procedures  . XR C-ARM NO REPORT  . Epidural Steroid injection    Follow-up: No follow-ups on file.   Procedures: No procedures performed  Lumbar Epidural Steroid Injection - Interlaminar Approach with Fluoroscopic Guidance  Patient: [name]      Date of Birth: 1936-10-19 MRN: [id number] PCP: Biagio Borg, MD      Visit Date: 08/29/2019   Universal Protocol:     Consent Given By: the patient  Position: PRONE  Additional Comments: Vital signs were  monitored before and after the procedure. Patient was prepped and draped in the usual sterile fashion. The correct patient, procedure, and site was verified.   Injection Procedure Details:  Procedure Site One Meds Administered:  Meds ordered this encounter  Medications  . methylPREDNISolone acetate (DEPO-MEDROL) injection 80 mg     Laterality: Left  Location/Site:  L4-L5  Needle size: 20 G  Needle type: Tuohy  Needle Placement: Paramedian epidural  Findings:   -Comments: Excellent flow of contrast into the epidural space.  Procedure Details: Using a paramedian approach from the side mentioned above, the region overlying the inferior lamina was localized under fluoroscopic visualization and the soft tissues overlying this structure were infiltrated with 4 ml. of 1% Lidocaine without Epinephrine. The Tuohy needle was inserted into the epidural space using a paramedian approach.   The epidural space was localized using loss of resistance along with lateral and bi-planar fluoroscopic views.  After negative aspirate for air, blood, and CSF, a 2 ml. volume of Isovue-250 was injected into the epidural space and the flow of contrast was observed. Radiographs were obtained for documentation purposes.    The injectate was administered into the level noted above.   Additional Comments:  The patient tolerated the procedure well Dressing: 2 x 2 sterile gauze and Band-Aid    Post-procedure details: Patient was observed during the procedure. Post-procedure instructions were reviewed.  Patient left the clinic in stable condition.  Clinical History: MRI LUMBAR SPINE WITHOUT CONTRAST  TECHNIQUE: Multiplanar, multisequence MR imaging of the lumbar spine was performed. No intravenous contrast was administered.  COMPARISON:  Abdominopelvic CT 07/11/2016.  FINDINGS: Segmentation: Conventional anatomy assumed, with the last open disc space designated L5-S1.  Alignment: There  is 5 mm of anterolisthesis at L4-5 secondary to facet disease. The alignment is otherwise normal.  Vertebrae: No worrisome osseous lesion, acute fracture or pars defect. The lumbar pedicles are diffusely short on a congenital basis. There is multilevel facet hypertrophy. The visualized sacroiliac joints appear unremarkable.  Conus medullaris: Extends to the L1-2 level and appears normal.  Paraspinal and other soft tissues: No significant paraspinal findings.  Disc levels:  No significant disc space findings are seen from T11-12 through L1-2.  L2-3: Loss of disc height with annular disc bulging and endplate osteophytes. There is mild facet and ligamentous hypertrophy. These factors contribute to mild spinal stenosis and mild narrowing of the left lateral recess and left foramen.  L3-4: Annular disc bulging, endplate osteophytes and moderate facet/ ligamentous hypertrophy contribute to moderate spinal stenosis. There is associated mild narrowing of the lateral recesses and foramina, greater on the left.  L4-5: Loss of disc height with annular disc bulging. There is advanced bilateral facet hypertrophy, accounting for the grade 1 anterolisthesis. There is resulting only mild spinal stenosis and mild narrowing of the lateral recesses and foramina bilaterally.  L5-S1: Disc height and hydration are relatively maintained. There is mild disc bulging eccentric to the left, contributing to mild left foraminal narrowing. There is mild bilateral facet hypertrophy. The spinal canal, lateral recesses and right foramen are widely patent.  IMPRESSION: 1. Multilevel spondylosis superimposed on a congenitally small canal. Resulting spinal stenosis is greatest at L3-4 where it is moderate. There is mild multifactorial spinal stenosis at L2-3 and L4-5. 2. Disc bulging, endplate osteophytes and facet hypertrophy contribute to mild narrowing of the lateral recesses and foramina as  detailed above. 3. No suggested acute findings.   Electronically Signed   By: Richardean Sale M.D.   On: 01/02/2017 09:29     Objective:  VS:  HT:    WT:   BMI:     BP:133/87  HR:100bpm  TEMP: ( )  RESP:  Physical Exam  Ortho Exam Imaging: No results found.

## 2019-10-02 ENCOUNTER — Telehealth: Payer: Self-pay | Admitting: Orthopaedic Surgery

## 2019-10-02 NOTE — Telephone Encounter (Signed)
Patient's wife calling because injection patient got about 1 1/2 months ago has not helped with pain. Per patient injection Dr. Durward Fortes gave him helped a lot more than Dr. Romona Curls injection. Patient wants to know what to do next to help with back pain.  Also, patient saw Dr. Estanislado Pandy years ago, and thinks he needs to be re- evaluated by doctor for possible bil knee arthritis. Patient would like to be referred back to Dr. Estanislado Pandy for this, if possible.  Please call to advise wife on both issues.

## 2019-10-02 NOTE — Telephone Encounter (Signed)
Spoke with patient. He was very clear with me that he only wants to see Dr.Whitfield, and nobody else. Can you please call and schedule patient to see Dr.Whitfield? Thanks!

## 2019-10-02 NOTE — Telephone Encounter (Signed)
Ok to refer to Dr Estanislado Pandy. Either return for me to reinject his back or consider referral to Dr Louanne Skye for spine evaluation-thanks

## 2019-10-02 NOTE — Telephone Encounter (Signed)
Please advise 

## 2019-10-08 ENCOUNTER — Ambulatory Visit: Payer: Medicare Other | Admitting: Orthopaedic Surgery

## 2019-10-09 ENCOUNTER — Ambulatory Visit: Payer: Medicare Other | Admitting: Orthopaedic Surgery

## 2019-10-10 ENCOUNTER — Other Ambulatory Visit: Payer: Self-pay

## 2019-10-10 ENCOUNTER — Ambulatory Visit: Payer: Medicare Other | Admitting: Orthopaedic Surgery

## 2019-10-10 ENCOUNTER — Encounter: Payer: Self-pay | Admitting: Orthopaedic Surgery

## 2019-10-10 VITALS — Ht 78.5 in | Wt 230.0 lb

## 2019-10-10 DIAGNOSIS — G8929 Other chronic pain: Secondary | ICD-10-CM

## 2019-10-10 DIAGNOSIS — M545 Low back pain: Secondary | ICD-10-CM | POA: Diagnosis not present

## 2019-10-10 MED ORDER — METHYLPREDNISOLONE ACETATE 40 MG/ML IJ SUSP
40.0000 mg | INTRAMUSCULAR | Status: AC | PRN
  Administered 2019-10-10: 40 mg via INTRA_ARTICULAR

## 2019-10-10 MED ORDER — BUPIVACAINE HCL 0.5 % IJ SOLN
1.0000 mL | INTRAMUSCULAR | Status: AC | PRN
  Administered 2019-10-10: 1 mL via INTRA_ARTICULAR

## 2019-10-10 MED ORDER — LIDOCAINE HCL 1 % IJ SOLN
2.0000 mL | INTRAMUSCULAR | Status: AC | PRN
  Administered 2019-10-10: 2 mL
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
## 2019-10-15 ENCOUNTER — Other Ambulatory Visit: Payer: Self-pay | Admitting: Internal Medicine

## 2019-10-21 ENCOUNTER — Telehealth: Payer: Self-pay | Admitting: Internal Medicine

## 2019-10-21 MED ORDER — ALLOPURINOL 100 MG PO TABS: 100.0000 mg | ORAL_TABLET | Freq: Every day | ORAL | 1 refills | Status: DC

## 2019-10-21 NOTE — Telephone Encounter (Signed)
Appointment schedule.  Due to patient's hearing issues and forgetfulness, patient's wife would like to come in with him if possible.

## 2019-10-21 NOTE — Telephone Encounter (Signed)
Noted  

## 2019-10-21 NOTE — Telephone Encounter (Signed)
[place] for allopurinol, but would need OV for increased lasix

## 2019-10-21 NOTE — Telephone Encounter (Signed)
Pt needs a refill on allopurinol and would like to increase furosemide 40 mg to twice a day. Pt sometime is having leg swelling and wife is requesting twice a day. [facility] [address]

## 2019-10-22 ENCOUNTER — Encounter: Payer: Self-pay | Admitting: Internal Medicine

## 2019-10-22 ENCOUNTER — Other Ambulatory Visit: Payer: Self-pay

## 2019-10-22 ENCOUNTER — Ambulatory Visit (INDEPENDENT_AMBULATORY_CARE_PROVIDER_SITE_OTHER): Payer: Medicare Other | Admitting: Internal Medicine

## 2019-10-22 VITALS — BP 118/80 | HR 89 | Temp 98.4°F | Ht 78.5 in | Wt 231.0 lb

## 2019-10-22 DIAGNOSIS — E119 Type 2 diabetes mellitus without complications: Secondary | ICD-10-CM

## 2019-10-22 DIAGNOSIS — I5031 Acute diastolic (congestive) heart failure: Secondary | ICD-10-CM

## 2019-10-22 DIAGNOSIS — I10 Essential (primary) hypertension: Secondary | ICD-10-CM | POA: Diagnosis not present

## 2019-10-22 DIAGNOSIS — M109 Gout, unspecified: Secondary | ICD-10-CM | POA: Diagnosis not present

## 2019-10-22 DIAGNOSIS — F528 Other sexual dysfunction not due to a substance or known physiological condition: Secondary | ICD-10-CM

## 2019-10-22 DIAGNOSIS — N183 Chronic kidney disease, stage 3 unspecified: Secondary | ICD-10-CM

## 2019-10-22 MED ORDER — FUROSEMIDE 40 MG PO TABS: ORAL_TABLET | ORAL | 3 refills | Status: DC

## 2019-10-22 MED ORDER — ALLOPURINOL 100 MG PO TABS: 100.0000 mg | ORAL_TABLET | Freq: Every day | ORAL | 3 refills | Status: DC

## 2019-10-22 MED ORDER — SILDENAFIL CITRATE 100 MG PO TABS: 50.0000 mg | ORAL_TABLET | Freq: Every day | ORAL | 11 refills | Status: DC | PRN

## 2019-10-22 MED ORDER — AMLODIPINE BESYLATE 5 MG PO TABS: 5.0000 mg | ORAL_TABLET | Freq: Every day | ORAL | 3 refills | Status: DC

## 2019-10-22 NOTE — Patient Instructions (Signed)
Please take all new medication as prescribed - the viagra  Please decrease the amlodipine to 5 mg per day, as this can lead to some swelling in the legs as a side effect  Please take the lasix fluid pill at one pill every AM, but also 1 pill in the PM as you need for persistent swelling  Please continue all other medications as before, and refills have been done if requested - the allopurinol  Please have the pharmacy call with any other refills you may need.  Please continue your efforts at being more active, low cholesterol diet, and weight control.  Please keep your appointments with your specialists as you may have planned
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
## 2019-10-24 ENCOUNTER — Telehealth: Payer: Self-pay | Admitting: Internal Medicine

## 2019-10-24 NOTE — Telephone Encounter (Signed)
Pt's wife has been informed and expressed understanding and will let the patient know.

## 2019-10-24 NOTE — Telephone Encounter (Signed)
Spoke to pt's wife. She states that [name] does well with the 10mg  and stated that the amlodipine was not a medication he was having an issue with. She stated that Adrein has already mentioned that he will just double up on the 5mg 's to make 10mg . I informed her that I will let PCP know.

## 2019-10-24 NOTE — Telephone Encounter (Signed)
We reduced the amlodipine from 10 to 5 mg due to the swelling in the legs  I told [name] this clearly at least 5 times during his visit, and is even mentioned on the AVS  Please continue the 5 mg and continue to check BP on a regular basis.  If he needs better BP control after this, we would likely need to consider a different medication to increase or start

## 2019-10-24 NOTE — Telephone Encounter (Signed)
Please have pt review the last AVS with her, as it clearly states we are reducing the 10 mg to 5 mg  Perhaps the pt did not show this to her

## 2019-10-24 NOTE — Telephone Encounter (Signed)
Copied from [place] [id number]. Topic: Quick Communication - Rx Refill/Question >> Oct 24, 2019  9:12 AM Burchel, Abbi R wrote: Medication: amLODipine (NORVASC) 5 MG tablet  Pt's wife states pt has been taking 10MG  tablets, and it has been working well for pt.  Pt would like to continue taking 10 MG tablets.  Please review/advise.   [id number] [place] [address] [place] [address] Phone: [phone] Fax: [phone]

## 2019-10-27 ENCOUNTER — Encounter: Payer: Self-pay | Admitting: Internal Medicine

## 2019-10-27 NOTE — Assessment & Plan Note (Signed)
Stable, ok for decreased amlodipine to 5 qd due to likely related worsening LE swelling

## 2019-10-27 NOTE — Assessment & Plan Note (Signed)
Stable, for allopurinol refill

## 2019-10-27 NOTE — Assessment & Plan Note (Signed)
Stable for viagra prn,  to f/u any worsening symptoms or concerns

## 2019-10-27 NOTE — Assessment & Plan Note (Signed)
stable overall by history and exam, recent data reviewed with pt, and pt to continue medical treatment as before,  to f/u any worsening symptoms or concerns  

## 2019-10-27 NOTE — Assessment & Plan Note (Addendum)
[place] for increased lasix with prn PM dosing  Note:  Total time for pt hx, exam, review of record with pt in the room, determination of diagnoses and plan for further eval and tx is > 40 min, with over 50% spent in coordination and counseling of patient including the differential dx, tx, further evaluation and other management of acute diast CHF, HTN, DM, gout, [name], ED

## 2019-10-29 ENCOUNTER — Other Ambulatory Visit: Payer: Self-pay | Admitting: Internal Medicine

## 2019-10-29 NOTE — Telephone Encounter (Signed)
Done erx 

## 2019-10-31 DIAGNOSIS — C44311 Basal cell carcinoma of skin of nose: Secondary | ICD-10-CM | POA: Diagnosis not present

## 2019-11-04 ENCOUNTER — Other Ambulatory Visit: Payer: Self-pay | Admitting: Internal Medicine

## 2019-12-17 DIAGNOSIS — L570 Actinic keratosis: Secondary | ICD-10-CM | POA: Diagnosis not present

## 2019-12-17 DIAGNOSIS — D0439 Carcinoma in situ of skin of other parts of face: Secondary | ICD-10-CM | POA: Diagnosis not present

## 2020-01-23 ENCOUNTER — Encounter: Payer: Self-pay | Admitting: Internal Medicine

## 2020-01-23 ENCOUNTER — Ambulatory Visit (INDEPENDENT_AMBULATORY_CARE_PROVIDER_SITE_OTHER): Payer: Medicare Other | Admitting: Internal Medicine

## 2020-01-23 ENCOUNTER — Other Ambulatory Visit: Payer: Self-pay

## 2020-01-23 VITALS — BP 168/90 | HR 92 | Temp 97.9°F | Ht 78.5 in | Wt 235.0 lb

## 2020-01-23 DIAGNOSIS — E611 Iron deficiency: Secondary | ICD-10-CM

## 2020-01-23 DIAGNOSIS — I10 Essential (primary) hypertension: Secondary | ICD-10-CM | POA: Diagnosis not present

## 2020-01-23 DIAGNOSIS — Z0000 Encounter for general adult medical examination without abnormal findings: Secondary | ICD-10-CM | POA: Diagnosis not present

## 2020-01-23 DIAGNOSIS — E119 Type 2 diabetes mellitus without complications: Secondary | ICD-10-CM | POA: Diagnosis not present

## 2020-01-23 DIAGNOSIS — E538 Deficiency of other specified B group vitamins: Secondary | ICD-10-CM

## 2020-01-23 DIAGNOSIS — E559 Vitamin D deficiency, unspecified: Secondary | ICD-10-CM

## 2020-01-23 LAB — LIPID PANEL
Cholesterol: 168 mg/dL (ref 0–200)
HDL: 51.9 mg/dL (ref >39.00)
LDL Cholesterol: 78 mg/dL (ref 0–99)
NonHDL: 115.96
Total CHOL/HDL Ratio: 3
Triglycerides: 191 mg/dL — ABNORMAL HIGH (ref 0.0–149.0)
VLDL: 38.2 mg/dL (ref 0.0–40.0)

## 2020-01-23 LAB — BASIC METABOLIC PANEL
BUN: 19 mg/dL (ref 6–23)
CO2: 27 mEq/L (ref 19–32)
Calcium: 9.4 mg/dL (ref 8.4–10.5)
Chloride: 103 mEq/L (ref 96–112)
Creatinine, Ser: 1.49 mg/dL (ref 0.40–1.50)
GFR: 45.19 mL/min — ABNORMAL LOW (ref >60.00)
Glucose, Bld: 203 mg/dL — ABNORMAL HIGH (ref 70–99)
Potassium: 3.9 mEq/L (ref 3.5–5.1)
Sodium: 139 mEq/L (ref 135–145)

## 2020-01-23 LAB — IBC PANEL
Iron: 41 ug/dL — ABNORMAL LOW (ref 42–165)
Saturation Ratios: 11.2 % — ABNORMAL LOW (ref 20.0–50.0)
Transferrin: 261 mg/dL (ref 212.0–360.0)

## 2020-01-23 LAB — HEMOGLOBIN A1C: Hgb A1c MFr Bld: 7.5 % — ABNORMAL HIGH (ref 4.6–6.5)

## 2020-01-23 LAB — URINALYSIS, ROUTINE W REFLEX MICROSCOPIC
Bilirubin Urine: NEGATIVE
Ketones, ur: NEGATIVE
Leukocytes,Ua: NEGATIVE
Nitrite: NEGATIVE
RBC / HPF: NONE SEEN (ref 0–?)
Specific Gravity, Urine: 1.025 (ref 1.000–1.030)
Total Protein, Urine: 100 — AB
Urine Glucose: 500 — AB
Urobilinogen, UA: 0.2 (ref 0.0–1.0)
pH: 6 (ref 5.0–8.0)

## 2020-01-23 LAB — MICROALBUMIN / CREATININE URINE RATIO
Creatinine,U: 132.4 mg/dL
Microalb Creat Ratio: 97.3 mg/g — ABNORMAL HIGH (ref 0.0–30.0)
Microalb, Ur: 128.9 mg/dL — ABNORMAL HIGH (ref 0.0–1.9)

## 2020-01-23 LAB — CBC WITH DIFFERENTIAL/PLATELET
Basophils Absolute: 0.1 10*3/uL (ref 0.0–0.1)
Basophils Relative: 0.6 % (ref 0.0–3.0)
Eosinophils Absolute: 1 10*3/uL — ABNORMAL HIGH (ref 0.0–0.7)
Eosinophils Relative: 11.4 % — ABNORMAL HIGH (ref 0.0–5.0)
HCT: 45.3 % (ref 39.0–52.0)
Hemoglobin: 14.5 g/dL (ref 13.0–17.0)
Lymphocytes Relative: 15.2 % (ref 12.0–46.0)
Lymphs Abs: 1.4 10*3/uL (ref 0.7–4.0)
MCHC: 32.1 g/dL (ref 30.0–36.0)
MCV: 85.3 fl (ref 78.0–100.0)
Monocytes Absolute: 0.9 10*3/uL (ref 0.1–1.0)
Monocytes Relative: 9.6 % (ref 3.0–12.0)
Neutro Abs: 5.6 10*3/uL (ref 1.4–7.7)
Neutrophils Relative %: 63.2 % (ref 43.0–77.0)
Platelets: 234 10*3/uL (ref 150.0–400.0)
RBC: 5.31 Mil/uL (ref 4.22–5.81)
RDW: 17.2 % — ABNORMAL HIGH (ref 11.5–15.5)
WBC: 8.9 10*3/uL (ref 4.0–10.5)

## 2020-01-23 LAB — HEPATIC FUNCTION PANEL
ALT: 11 U/L (ref 0–53)
AST: 15 U/L (ref 0–37)
Albumin: 4 g/dL (ref 3.5–5.2)
Alkaline Phosphatase: 137 U/L — ABNORMAL HIGH (ref 39–117)
Bilirubin, Direct: 0.1 mg/dL (ref 0.0–0.3)
Total Bilirubin: 0.5 mg/dL (ref 0.2–1.2)
Total Protein: 6.9 g/dL (ref 6.0–8.3)

## 2020-01-23 LAB — VITAMIN B12: Vitamin B-12: 190 pg/mL — ABNORMAL LOW (ref 211–911)

## 2020-01-23 LAB — VITAMIN D 25 HYDROXY (VIT D DEFICIENCY, FRACTURES): VITD: 15.88 ng/mL — ABNORMAL LOW (ref 30.00–100.00)

## 2020-01-23 LAB — TSH: TSH: 1.52 u[IU]/mL (ref 0.35–4.50)

## 2020-01-23 MED ORDER — AMLODIPINE BESYLATE 5 MG PO TABS: ORAL_TABLET | ORAL | 3 refills | Status: DC

## 2020-01-23 NOTE — Patient Instructions (Signed)
Ok to increase the amlodipine 5 mg twice per day  Please continue all other medications as before, and refills have been done if requested.  Please have the pharmacy call with any other refills you may need.  Please continue your efforts at being more active, low cholesterol diet, and weight control.  You are otherwise up to date with prevention measures today.  Please keep your appointments with your specialists as you may have planned  Please go to the LAB at the blood drawing area for the tests to be done  You will be contacted by phone if any changes need to be made immediately.  Otherwise, you will receive a letter about your results with an explanation, but please check with MyChart first.  Please remember to sign up for MyChart if you have not done so, as this will be important to you in the future with finding out test results, communicating by private email, and scheduling acute appointments online when needed.  Please make an Appointment to return in 6 months, or sooner if needed, also with Lab Appointment for testing done 3-5 days before at the [facility] (so this is for TWO appointments - please see the scheduling desk as you leave)

## 2020-01-23 NOTE — Assessment & Plan Note (Signed)

## 2020-01-23 NOTE — Assessment & Plan Note (Signed)
[place] for increased amlodipine 5 bid

## 2020-01-23 NOTE — Assessment & Plan Note (Signed)
stable overall by history and exam, recent data reviewed with pt, and pt to continue medical treatment as before,  to f/u any worsening symptoms or concerns, for lab f/u 
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
## 2020-01-24 ENCOUNTER — Telehealth: Payer: Self-pay

## 2020-01-24 NOTE — Telephone Encounter (Signed)
New message   Wife calling   Pt c/o medication issue:  1. Name of Medication: amLODipine (NORVASC) 5 MG tablet  2. How are you currently taking this medication (dosage and times per day)? One a day  3. Are you having a reaction (difficulty breathing--STAT)? No   4. What is your medication issue? Increase to twice a day / what was called in was Furosemide   5. [facility] [address]

## 2020-01-24 NOTE — Telephone Encounter (Signed)
Reviewed chart. Rx for amlodipine was sent in and receipt of rx confirmed by pharmacy on 01/23/2020 at 11am.   Pt or spouse will need to call and request rx for amlodipine to be filled it is time refill.

## 2020-01-26 ENCOUNTER — Encounter: Payer: Self-pay | Admitting: Internal Medicine

## 2020-01-26 ENCOUNTER — Other Ambulatory Visit: Payer: Self-pay | Admitting: Internal Medicine

## 2020-01-26 MED ORDER — VITAMIN D (ERGOCALCIFEROL) 1.25 MG (50000 UNIT) PO CAPS: 50000.0000 [IU] | ORAL_CAPSULE | ORAL | 0 refills | Status: DC

## 2020-01-26 MED ORDER — VITAMIN B-12 1000 MCG PO TABS: 1000.0000 ug | ORAL_TABLET | Freq: Every day | ORAL | 1 refills | Status: AC

## 2020-01-26 MED ORDER — POLYSACCHARIDE IRON COMPLEX 150 MG PO CAPS: 150.0000 mg | ORAL_CAPSULE | Freq: Every day | ORAL | 1 refills | Status: DC

## 2020-02-17 DIAGNOSIS — C44311 Basal cell carcinoma of skin of nose: Secondary | ICD-10-CM | POA: Diagnosis not present

## 2020-02-19 ENCOUNTER — Other Ambulatory Visit: Payer: Self-pay | Admitting: Internal Medicine

## 2020-02-26 ENCOUNTER — Other Ambulatory Visit: Payer: Self-pay

## 2020-02-26 ENCOUNTER — Encounter: Payer: Self-pay | Admitting: Orthopaedic Surgery

## 2020-02-26 ENCOUNTER — Ambulatory Visit: Payer: Medicare Other | Admitting: Orthopaedic Surgery

## 2020-02-26 VITALS — Ht 78.5 in | Wt 235.0 lb

## 2020-02-26 DIAGNOSIS — M545 Low back pain: Secondary | ICD-10-CM

## 2020-02-26 DIAGNOSIS — G8929 Other chronic pain: Secondary | ICD-10-CM | POA: Diagnosis not present

## 2020-02-26 MED ORDER — LIDOCAINE HCL 1 % IJ SOLN
1.0000 mL | INTRAMUSCULAR | Status: AC | PRN
  Administered 2020-02-26: 1 mL

## 2020-02-26 MED ORDER — METHYLPREDNISOLONE ACETATE 40 MG/ML IJ SUSP
40.0000 mg | INTRAMUSCULAR | Status: AC | PRN
  Administered 2020-02-26: 15:00:00 40 mg via INTRAMUSCULAR
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
## 2020-03-22 ENCOUNTER — Other Ambulatory Visit: Payer: Self-pay | Admitting: Gastroenterology

## 2020-03-24 ENCOUNTER — Telehealth: Payer: Self-pay | Admitting: Gastroenterology

## 2020-03-24 NOTE — Telephone Encounter (Signed)
Received refill request for pantoprazole. Last seen over 2 years ago. Limited refill provided. Patient needs ov.

## 2020-03-25 ENCOUNTER — Encounter: Payer: Self-pay | Admitting: Gastroenterology

## 2020-03-25 NOTE — Telephone Encounter (Signed)
Sent patient letter stating he needs to call and schedule appointment

## 2020-04-11 ENCOUNTER — Other Ambulatory Visit: Payer: Self-pay | Admitting: Internal Medicine

## 2020-04-11 NOTE — Telephone Encounter (Signed)
Please change to OTC Vitamin D3 at 2000 units per day, indefinitely.  

## 2020-04-16 ENCOUNTER — Other Ambulatory Visit: Payer: Self-pay | Admitting: Internal Medicine

## 2020-04-16 NOTE — Telephone Encounter (Signed)
Done erx 

## 2020-05-10 ENCOUNTER — Other Ambulatory Visit: Payer: Self-pay | Admitting: Internal Medicine

## 2020-05-10 NOTE — Telephone Encounter (Signed)
Please refill as per office routine med refill policy (all routine meds refilled for 3 mo or monthly per pt preference up to one year from last visit, then month to month grace period for 3 mo, then further med refills will have to be denied)  

## 2020-06-19 ENCOUNTER — Other Ambulatory Visit: Payer: Self-pay | Admitting: Gastroenterology

## 2020-06-19 ENCOUNTER — Other Ambulatory Visit: Payer: Self-pay | Admitting: Internal Medicine

## 2020-06-19 NOTE — Telephone Encounter (Signed)
Please refill as per office routine med refill policy (all routine meds refilled for 3 mo or monthly per pt preference up to one year from last visit, then month to month grace period for 3 mo, then further med refills will have to be denied)  

## 2020-06-26 ENCOUNTER — Other Ambulatory Visit: Payer: Self-pay

## 2020-06-26 ENCOUNTER — Encounter: Payer: Self-pay | Admitting: Orthopaedic Surgery

## 2020-06-26 ENCOUNTER — Ambulatory Visit: Payer: Medicare Other | Admitting: Orthopaedic Surgery

## 2020-06-26 DIAGNOSIS — G8929 Other chronic pain: Secondary | ICD-10-CM

## 2020-06-26 DIAGNOSIS — M545 Low back pain: Secondary | ICD-10-CM | POA: Diagnosis not present

## 2020-06-26 MED ORDER — METHYLPREDNISOLONE ACETATE 40 MG/ML IJ SUSP
40.0000 mg | INTRAMUSCULAR | Status: AC | PRN
  Administered 2020-06-26: 40 mg via INTRAMUSCULAR

## 2020-06-26 MED ORDER — LIDOCAINE HCL 1 % IJ SOLN
2.0000 mL | INTRAMUSCULAR | Status: AC | PRN
  Administered 2020-06-26: 2 mL
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
## 2020-07-09 NOTE — Progress Notes (Signed)
Primary Care Physician: Biagio Borg, MD  Primary Gastroenterologist:  Garfield Cornea, MD   Chief Complaint  Patient presents with  . Follow-up  . Gastroesophageal Reflux    refill of Pantoprazole    HPI: [name] is a 84 y.o. male here for follow-up of GERD, refill medication.  Last seen February 2019.  Patient doing well.  Ran out of pantoprazole about 2 weeks ago.  So far no recurrent symptoms.  He has a history of reflux esophagitis, esophageal ulcer, duodenal ulcer, Schatzki ring in the past.  At this time he denies heartburn, abdominal pain, dysphagia.  He has had some vague nausea since coming off the pantoprazole.  Bowel movements are regular.  No blood in stool or melena.  He is dropped about 7 to 8 pounds over the summer which is typical for him.  He states really busy and does not eat as much.  He and his wife are not concerned about this.  He has a history of microcytic anemia back in 2017. He underwent EGD and colonoscopy back in August 2017. 2 polyps removed from the colon, one was tubular adenoma. He had one superficial esophageal ulcer with no stigmata of bleeding, noncritical Schatzki ring, erythematous mucosa in the stomach, one nonbleeding duodenal ulcer with no stigmata bleeding. H. pylori biopsy was negative. He was started on protonix 40 mg daily. Advised to avoid NSAIDs.  Patient was hospitalized back in November 2018, after getting food lodged in his esophagus. He tried to wash it down with liquid but began to vomit and vomited fresh blood. EGD performed by Dr. Laural Golden (on call provider) and was found to have a linear esophageal ulcer with no bleeding and stigmata of recent bleeding at 34 to 36 cm from the incisors. Lesion was 6 mm. Food found in the distal esophagus, removed. 3 cm hiatal hernia. Diffuse congestion, erosions, erythema the duodenal bulb. H. pylori serology's were negative.   Current Outpatient Medications  Medication Sig Dispense Refill    . allopurinol (ZYLOPRIM) 100 MG tablet Take 1 tablet (100 mg total) by mouth daily. 90 tablet 3  . amLODipine (NORVASC) 5 MG tablet 1 tab by mouth twice per day 180 tablet 3  . atorvastatin (LIPITOR) 20 MG tablet TAKE 1 TABLET BY MOUTH EVERY DAY 90 tablet 3  . colchicine 0.6 MG tablet TAKE 1 TABLET (0.6 MG TOTAL) BY MOUTH 2 (TWO) TIMES DAILY. 180 tablet 1  . furosemide (LASIX) 40 MG tablet 1 tab by mouth each AM, and 1 tab in afternoon as needed for persistent swelling 180 tablet 3  . glipiZIDE (GLUCOTROL XL) 10 MG 24 hr tablet TAKE 1 TABLET BY MOUTH EVERY DAY WITH BREAKFAST 90 tablet 1  . glucose blood (ONE TOUCH ULTRA TEST) test strip 1 each by Other route as needed for other. Use to check blood sugars twice a day  E11.9 200 each 1  . HYDROcodone-acetaminophen (NORCO/VICODIN) 5-325 MG tablet Take 1 tablet by mouth every 6 (six) hours as needed for moderate pain or severe pain. 30 tablet 0  . metFORMIN (GLUCOPHAGE-XR) 500 MG 24 hr tablet TAKE 1 TABLET BY MOUTH EVERY DAY WITH BREAKFAST 90 tablet 1  . sildenafil (VIAGRA) 100 MG tablet Take 0.5-1 tablets (50-100 mg total) by mouth daily as needed for erectile dysfunction. 20 tablet 11  . testosterone cypionate (DEPOTESTOSTERONE CYPIONATE) 200 MG/ML injection INJECT 1 MILLILITER INTO THE MUSCLE EVERY 14 DAYS 6 mL 1  . vitamin B-12 (CYANOCOBALAMIN)  1000 MCG tablet Take 1 tablet (1,000 mcg total) by mouth daily. 90 tablet 1  . Vitamin D, Ergocalciferol, (DRISDOL) 1.25 MG (50000 UNIT) CAPS capsule Take 1 capsule (50,000 Units total) by mouth every 7 (seven) days. 12 capsule 0  . pantoprazole (PROTONIX) 40 MG tablet TAKE 1 TABLET BY MOUTH EVERY DAY BEFORE BREAKFAST (Patient not taking: Reported on 07/10/2020) 90 tablet 0   No current facility-administered medications for this visit.    Allergies as of 07/10/2020 - Review Complete 07/10/2020  Allergen Reaction Noted  . Influenza vaccines  11/01/2017  . Labetalol hcl Swelling   . Shellfish allergy  Swelling 08/27/2015    ROS:  General: Negative for anorexia,  fever, chills, fatigue, weakness.  See HPI ENT: Negative for hoarseness, difficulty swallowing , nasal congestion. CV: Negative for chest pain, angina, palpitations, dyspnea on exertion, peripheral edema.  Respiratory: Negative for dyspnea at rest, dyspnea on exertion, cough, sputum, wheezing.  GI: See history of present illness. GU:  Negative for dysuria, hematuria, urinary incontinence, urinary frequency, nocturnal urination.  Endo: Negative for unusual weight change.    Physical Examination:   BP (!) 151/87   Pulse 51   Temp (!) 97.5 F (36.4 C) (Oral)   Wt (!) 218 lb 3.2 oz (99 kg)   BMI 24.90 kg/m   General: Well-nourished, well-developed in no acute distress.  Eyes: No icterus. Mouth: masked.  Abdomen: Bowel sounds are normal, nontender, nondistended, no hepatosplenomegaly or masses, no abdominal bruits or hernia , no rebound or guarding.   Extremities: No lower extremity edema. No clubbing or deformities. Neuro: Alert and oriented x 4   Skin: Warm and dry, no jaundice.   Psych: Alert and cooperative, normal mood and affect.  Labs:  Lab Results  Component Value Date   CREATININE 1.49 01/23/2020   BUN 19 01/23/2020   NA 139 01/23/2020   K 3.9 01/23/2020   CL 103 01/23/2020   CO2 27 01/23/2020   Lab Results  Component Value Date   WBC 8.9 01/23/2020   HGB 14.5 01/23/2020   HCT 45.3 01/23/2020   MCV 85.3 01/23/2020   PLT 234.0 01/23/2020   Lab Results  Component Value Date   ALT 11 01/23/2020   AST 15 01/23/2020   ALKPHOS 137 (H) 01/23/2020   BILITOT 0.5 01/23/2020     Imaging Studies: No results found.   Impression/plan:   Very pleasant 84 year old gentleman with history of GERD with food obstructions and esophageal ulcers as outlined above.  History of prior duodenal ulcer as well.  Because of history of complicated GERD, we recommended ongoing PPI therapy.  Patient and wife both believe  he does much better when he stays on medication.  Currently some mild nausea since stopping pantoprazole.  Will resume pantoprazole 40 mg daily before breakfast.  If nausea does not subside, they will let me know.  Also monitor for any further weight loss.  We will plan to see him back in a year.

## 2020-07-10 ENCOUNTER — Other Ambulatory Visit: Payer: Self-pay

## 2020-07-10 ENCOUNTER — Encounter: Payer: Self-pay | Admitting: Gastroenterology

## 2020-07-10 ENCOUNTER — Ambulatory Visit (INDEPENDENT_AMBULATORY_CARE_PROVIDER_SITE_OTHER): Payer: Medicare Other | Admitting: Gastroenterology

## 2020-07-10 VITALS — BP 151/87 | HR 51 | Temp 97.5°F | Wt 218.2 lb

## 2020-07-10 DIAGNOSIS — K2100 Gastro-esophageal reflux disease with esophagitis, without bleeding: Secondary | ICD-10-CM

## 2020-07-10 MED ORDER — PANTOPRAZOLE SODIUM 40 MG PO TBEC: DELAYED_RELEASE_TABLET | ORAL | 3 refills | Status: DC

## 2020-07-10 NOTE — Patient Instructions (Signed)
1. Resume pantoprazole once daily before breakfast.  2. If nausea persists, please let me know. 3. Continue to watch weight, if ongoing weight loss please let me know. 4. We will see you back in one year.

## 2020-07-17 ENCOUNTER — Encounter: Payer: Self-pay | Admitting: Neurology

## 2020-07-17 ENCOUNTER — Encounter: Payer: Self-pay | Admitting: Internal Medicine

## 2020-07-17 ENCOUNTER — Other Ambulatory Visit: Payer: Self-pay

## 2020-07-17 ENCOUNTER — Ambulatory Visit (INDEPENDENT_AMBULATORY_CARE_PROVIDER_SITE_OTHER): Payer: Medicare Other | Admitting: Internal Medicine

## 2020-07-17 VITALS — BP 120/70 | HR 67 | Temp 97.9°F | Ht 78.5 in | Wt 218.0 lb

## 2020-07-17 DIAGNOSIS — E538 Deficiency of other specified B group vitamins: Secondary | ICD-10-CM

## 2020-07-17 DIAGNOSIS — R413 Other amnesia: Secondary | ICD-10-CM | POA: Diagnosis not present

## 2020-07-17 DIAGNOSIS — E782 Mixed hyperlipidemia: Secondary | ICD-10-CM | POA: Diagnosis not present

## 2020-07-17 DIAGNOSIS — N183 Chronic kidney disease, stage 3 unspecified: Secondary | ICD-10-CM

## 2020-07-17 DIAGNOSIS — F528 Other sexual dysfunction not due to a substance or known physiological condition: Secondary | ICD-10-CM

## 2020-07-17 DIAGNOSIS — E119 Type 2 diabetes mellitus without complications: Secondary | ICD-10-CM

## 2020-07-17 DIAGNOSIS — I10 Essential (primary) hypertension: Secondary | ICD-10-CM | POA: Diagnosis not present

## 2020-07-17 DIAGNOSIS — E559 Vitamin D deficiency, unspecified: Secondary | ICD-10-CM | POA: Diagnosis not present

## 2020-07-17 DIAGNOSIS — E611 Iron deficiency: Secondary | ICD-10-CM | POA: Diagnosis not present

## 2020-07-17 DIAGNOSIS — F0390 Unspecified dementia without behavioral disturbance: Secondary | ICD-10-CM | POA: Insufficient documentation

## 2020-07-17 DIAGNOSIS — D509 Iron deficiency anemia, unspecified: Secondary | ICD-10-CM | POA: Insufficient documentation

## 2020-07-17 MED ORDER — DONEPEZIL HCL 5 MG PO TABS: 5.0000 mg | ORAL_TABLET | Freq: Every day | ORAL | 3 refills | Status: DC

## 2020-07-17 MED ORDER — TADALAFIL 20 MG PO TABS: 10.0000 mg | ORAL_TABLET | ORAL | 11 refills | Status: DC | PRN

## 2020-07-17 NOTE — Assessment & Plan Note (Addendum)
Ok for f/u a1c today, may be able to reduce the glipizide

## 2020-07-17 NOTE — Assessment & Plan Note (Signed)
[place] for d/c amlodipine, f/u BP at home and next visit

## 2020-07-17 NOTE — Assessment & Plan Note (Signed)
For f/u lab, asympt

## 2020-07-17 NOTE — Assessment & Plan Note (Signed)
Likely new dementia, for aricept 5 qd, MRI brain, b12 check, refer neurology  I spent 45 minutes in preparing to see the patient by review of recent labs, imaging and procedures, obtaining and reviewing separately obtained history, communicating with the patient and family or caregiver, ordering medications, tests or procedures, and documenting clinical information in the EHR including the differential Dx, treatment, and any further evaluation and other management of memory loss, vit d and b12 deficienc, DM, htn, hld, iron deficiency anemia, ckd, ED

## 2020-07-17 NOTE — Assessment & Plan Note (Signed)
stable overall by history and exam, recent data reviewed with pt, and pt to continue medical treatment as before,  to f/u any worsening symptoms or concern,s, for f/u lab

## 2020-07-17 NOTE — Assessment & Plan Note (Signed)
For vit d 2000 u qd 
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
## 2020-07-17 NOTE — Assessment & Plan Note (Signed)
Ok for change viagra to cialis prn,  to f/u any worsening symptoms or concerns 

## 2020-07-17 NOTE — Patient Instructions (Signed)
Please take OTC Vitamin D3 at 2000 units per day, indefinitely.  Please take all new medication as prescribed - the generic for aricept for memory, and cialis as needed  Ok to stop the amlodipine for blood pressure  Please continue all other medications as before, and refills have been done if requested.  Please have the pharmacy call with any other refills you may need.  Please continue your efforts at being more active, low cholesterol diet, and weight control.  You are otherwise up to date with prevention measures today.  Please keep your appointments with your specialists as you may have planned  You will be contacted regarding the referral for: Neurology, and the MRI for the brain  Please go to the LAB at the blood drawing area for the tests to be done  You will be contacted by phone if any changes need to be made immediately.  Otherwise, you will receive a letter about your results with an explanation, but please check with MyChart first.  Please remember to sign up for MyChart if you have not done so, as this will be important to you in the future with finding out test results, communicating by private email, and scheduling acute appointments online when needed.  Please make an Appointment to return in 6 months, or sooner if needed

## 2020-07-17 NOTE — Assessment & Plan Note (Signed)
stable overall by history and exam, recent data reviewed with pt, and pt to continue medical treatment as before,  to f/u any worsening symptoms or concerns  

## 2020-07-18 LAB — COMPLETE METABOLIC PANEL WITH GFR
AG Ratio: 1.7 (calc) (ref 1.0–2.5)
ALT: 9 U/L (ref 9–46)
AST: 14 U/L (ref 10–35)
Albumin: 3.8 g/dL (ref 3.6–5.1)
Alkaline phosphatase (APISO): 133 U/L (ref 35–144)
BUN/Creatinine Ratio: 15 (calc) (ref 6–22)
BUN: 28 mg/dL — ABNORMAL HIGH (ref 7–25)
CO2: 22 mmol/L (ref 20–32)
Calcium: 9.2 mg/dL (ref 8.6–10.3)
Chloride: 106 mmol/L (ref 98–110)
Creat: 1.89 mg/dL — ABNORMAL HIGH (ref 0.70–1.11)
GFR, Est African American: 37 mL/min/{1.73_m2} — ABNORMAL LOW (ref >=60)
GFR, Est Non African American: 32 mL/min/{1.73_m2} — ABNORMAL LOW (ref >=60)
Globulin: 2.3 g/dL (calc) (ref 1.9–3.7)
Glucose, Bld: 237 mg/dL — ABNORMAL HIGH (ref 65–99)
Potassium: 4.3 mmol/L (ref 3.5–5.3)
Sodium: 140 mmol/L (ref 135–146)
Total Bilirubin: 0.5 mg/dL (ref 0.2–1.2)
Total Protein: 6.1 g/dL (ref 6.1–8.1)

## 2020-07-18 LAB — LIPID PANEL
Cholesterol: 136 mg/dL (ref <200)
HDL: 45 mg/dL (ref >=40)
LDL Cholesterol (Calc): 67 mg/dL (calc)
Non-HDL Cholesterol (Calc): 91 mg/dL (calc) (ref <130)
Total CHOL/HDL Ratio: 3 (calc) (ref <5.0)
Triglycerides: 160 mg/dL — ABNORMAL HIGH (ref <150)

## 2020-07-18 LAB — HEMOGLOBIN A1C
Hgb A1c MFr Bld: 7.2 % of total Hgb — ABNORMAL HIGH (ref <5.7)
Mean Plasma Glucose: 160 (calc)
eAG (mmol/L): 8.9 (calc)

## 2020-07-18 LAB — TRANSFERRIN: Transferrin: 243 mg/dL (ref 188–341)

## 2020-07-18 LAB — FERRITIN: Ferritin: 55 ng/mL (ref 24–380)

## 2020-07-18 LAB — VITAMIN D 25 HYDROXY (VIT D DEFICIENCY, FRACTURES): Vit D, 25-Hydroxy: 23 ng/mL — ABNORMAL LOW (ref 30–100)

## 2020-07-18 LAB — VITAMIN B12: Vitamin B-12: 285 pg/mL (ref 200–1100)

## 2020-07-20 ENCOUNTER — Encounter: Payer: Self-pay | Admitting: Internal Medicine

## 2020-08-04 ENCOUNTER — Other Ambulatory Visit: Payer: Self-pay | Admitting: Internal Medicine

## 2020-08-13 ENCOUNTER — Ambulatory Visit
Admission: RE | Admit: 2020-08-13 | Discharge: 2020-08-13 | Disposition: A | Discharge status: Home / Self Care | Payer: Medicare Other | Source: Ambulatory Visit | Attending: Internal Medicine | Admitting: Internal Medicine

## 2020-08-13 DIAGNOSIS — R413 Other amnesia: Secondary | ICD-10-CM

## 2020-08-14 ENCOUNTER — Telehealth: Payer: Self-pay | Admitting: Internal Medicine

## 2020-08-14 MED ORDER — DIAZEPAM 5 MG PO TABS: ORAL_TABLET | ORAL | 0 refills | Status: DC

## 2020-08-14 NOTE — Telephone Encounter (Signed)
    Spouse calling to request medication be prescribed prior to patient having MRI. Patient went to have MRI on 9/2 but could not complete test due to anxiety  Please advise

## 2020-08-14 NOTE — Telephone Encounter (Signed)
Ok valium 5 mg x 1 sent to pharmacy to take one hr prior to procedure

## 2020-08-14 NOTE — Telephone Encounter (Signed)
Spoke with patient today. 

## 2020-09-09 ENCOUNTER — Ambulatory Visit
Admission: RE | Admit: 2020-09-09 | Discharge: 2020-09-09 | Disposition: A | Discharge status: Home / Self Care | Payer: Medicare Other | Source: Ambulatory Visit | Attending: Internal Medicine | Admitting: Internal Medicine

## 2020-09-09 DIAGNOSIS — R413 Other amnesia: Secondary | ICD-10-CM | POA: Diagnosis not present

## 2020-09-09 DIAGNOSIS — J0120 Acute ethmoidal sinusitis, unspecified: Secondary | ICD-10-CM | POA: Diagnosis not present

## 2020-09-09 DIAGNOSIS — G319 Degenerative disease of nervous system, unspecified: Secondary | ICD-10-CM | POA: Diagnosis not present

## 2020-09-09 DIAGNOSIS — J322 Chronic ethmoidal sinusitis: Secondary | ICD-10-CM | POA: Diagnosis not present

## 2020-09-10 ENCOUNTER — Encounter: Payer: Self-pay | Admitting: Internal Medicine

## 2020-09-16 ENCOUNTER — Telehealth: Payer: Self-pay | Admitting: Internal Medicine

## 2020-09-16 MED ORDER — FUROSEMIDE 40 MG PO TABS: ORAL_TABLET | ORAL | 0 refills | Status: DC

## 2020-09-16 NOTE — Telephone Encounter (Signed)
Tried to call pt to inform new [facility] but there was no naswer x's 10 rings. Sent rx to [facility]

## 2020-09-16 NOTE — Telephone Encounter (Signed)
    Patient requesting early refill for furosemide (LASIX) 40 MG tablet Spouse states he is taking 2 tablets daily Only has 3 pills remaining

## 2020-09-23 MED ORDER — FUROSEMIDE 40 MG PO TABS: ORAL_TABLET | ORAL | 0 refills | Status: DC

## 2020-09-23 NOTE — Telephone Encounter (Signed)
Patient states the pharmacy did not get the RX, can you send the medication again.

## 2020-09-23 NOTE — Addendum Note (Signed)
Addended by: Earnstine Regal on: 09/23/2020 10:46 AM   Modules accepted: Orders

## 2020-09-23 NOTE — Telephone Encounter (Signed)
Resent rx to [facility] 

## 2020-09-30 ENCOUNTER — Telehealth: Payer: Self-pay | Admitting: Internal Medicine

## 2020-09-30 NOTE — Telephone Encounter (Signed)
Ok but before I do, have the caller check with the pharmacy about the cost, since it has been outragous in the past and VERY expensive

## 2020-09-30 NOTE — Telephone Encounter (Signed)
Sent to Dr. John. 

## 2020-09-30 NOTE — Telephone Encounter (Signed)
  Spouse calling to request brand name Viagra be ordered. She states the patient feels the brand name may be more effective.  Please advise

## 2020-10-05 NOTE — Telephone Encounter (Signed)
LVM for pt to call pharmacy about the price and to call the clinic if he would still like to have Dr. Jenny Reichmann send in this rx.

## 2020-10-20 ENCOUNTER — Other Ambulatory Visit: Payer: Self-pay

## 2020-10-20 ENCOUNTER — Ambulatory Visit: Payer: Medicare Other | Admitting: Neurology

## 2020-10-20 ENCOUNTER — Encounter: Payer: Self-pay | Admitting: Neurology

## 2020-10-20 VITALS — HR 88 | Resp 20 | Ht 79.0 in | Wt 222.0 lb

## 2020-10-20 DIAGNOSIS — F0280 Dementia in other diseases classified elsewhere without behavioral disturbance: Secondary | ICD-10-CM

## 2020-10-20 DIAGNOSIS — G301 Alzheimer's disease with late onset: Secondary | ICD-10-CM | POA: Diagnosis not present

## 2020-10-20 MED ORDER — RIVASTIGMINE TARTRATE 1.5 MG PO CAPS: 1.5000 mg | ORAL_CAPSULE | Freq: Two times a day (BID) | ORAL | 11 refills | Status: DC

## 2020-10-20 NOTE — Patient Instructions (Signed)
1. Start Rivastigmine 1.5mg : take 1 capsule twice a day. If no side effects in a month, call our office and we will increase the dose to 3mg  twice a day  2. Start doing medications together with your wife  3. Continue to monitor driving  4. Follow-up in 6-8 months, call for any changes   FALL PRECAUTIONS: Be cautious when walking. Scan the area for obstacles that may increase the risk of trips and falls. When getting up in the mornings, sit up at the edge of the bed for a few minutes before getting out of bed. Consider elevating the bed at the head end to avoid drop of blood pressure when getting up. Walk always in a well-lit room (use night lights in the walls). Avoid area rugs or power cords from appliances in the middle of the walkways. Use a walker or a cane if necessary and consider physical therapy for balance exercise. Get your eyesight checked regularly.  FINANCIAL OVERSIGHT: Supervision, especially oversight when making financial decisions or transactions is also recommended.  HOME SAFETY: Consider the safety of the kitchen when operating appliances like stoves, microwave oven, and blender. Consider having supervision and share cooking responsibilities until no longer able to participate in those. Accidents with firearms and other hazards in the house should be identified and addressed as well.  DRIVING: Regarding driving, in patients with progressive memory problems, driving will be impaired. We advise to have someone else do the driving if trouble finding directions or if minor accidents are reported. Independent driving assessment is available to determine safety of driving.  ABILITY TO BE LEFT ALONE: If patient is unable to contact 911 operator, consider using LifeLine, or when the need is there, arrange for someone to stay with patients. Smoking is a fire hazard, consider supervision or cessation. Risk of wandering should be assessed by caregiver and if detected at any point,  supervision and safe proof recommendations should be instituted.  MEDICATION SUPERVISION: Inability to self-administer medication needs to be constantly addressed. Implement a mechanism to ensure safe administration of the medications.  RECOMMENDATIONS FOR ALL PATIENTS WITH MEMORY PROBLEMS: 1. Continue to exercise (Recommend 30 minutes of walking everyday, or 3 hours every week) 2. Increase social interactions - continue going to [place] and enjoy social gatherings with friends and family 3. Eat healthy, avoid fried foods and eat more fruits and vegetables 4. Maintain adequate blood pressure, blood sugar, and blood cholesterol level. Reducing the risk of stroke and cardiovascular disease also helps promoting better memory. 5. Avoid stressful situations. Live a simple life and avoid aggravations. Organize your time and prepare for the next day in anticipation. 6. Sleep well, avoid any interruptions of sleep and avoid any distractions in the bedroom that may interfere with adequate sleep quality 7. Avoid sugar, avoid sweets as there is a strong link between excessive sugar intake, diabetes, and cognitive impairment The Mediterranean diet has been shown to help patients reduce the risk of progressive memory disorders and reduces cardiovascular risk. This includes eating fish, eat fruits and green leafy vegetables, nuts like almonds and hazelnuts, walnuts, and also use olive oil. Avoid fast foods and fried foods as much as possible. Avoid sweets and sugar as sugar use has been linked to worsening of memory function.  There is always a concern of gradual progression of memory problems. If this is the case, then we may need to adjust level of care according to patient needs. Support, both to the patient and caregiver, should then be  put into place.
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
## 2020-11-19 ENCOUNTER — Other Ambulatory Visit: Payer: Self-pay | Admitting: Internal Medicine

## 2020-12-09 ENCOUNTER — Other Ambulatory Visit: Payer: Self-pay

## 2020-12-09 ENCOUNTER — Ambulatory Visit: Payer: Medicare Other | Admitting: Dermatology

## 2020-12-09 ENCOUNTER — Encounter: Payer: Self-pay | Admitting: Dermatology

## 2020-12-09 DIAGNOSIS — Z1283 Encounter for screening for malignant neoplasm of skin: Secondary | ICD-10-CM

## 2020-12-09 DIAGNOSIS — C44629 Squamous cell carcinoma of skin of left upper limb, including shoulder: Secondary | ICD-10-CM | POA: Diagnosis not present

## 2020-12-09 DIAGNOSIS — C4492 Squamous cell carcinoma of skin, unspecified: Secondary | ICD-10-CM

## 2020-12-09 NOTE — Patient Instructions (Signed)

## 2020-12-12 ENCOUNTER — Other Ambulatory Visit: Payer: Self-pay | Admitting: Internal Medicine

## 2020-12-12 NOTE — Telephone Encounter (Signed)
Please refill as per office routine med refill policy (all routine meds refilled for 3 mo or monthly per pt preference up to one year from last visit, then month to month grace period for 3 mo, then further med refills will have to be denied)  

## 2020-12-13 ENCOUNTER — Encounter: Payer: Self-pay | Admitting: Dermatology

## 2020-12-13 NOTE — Progress Notes (Signed)
   Follow-Up Visit   Subjective  [name] is a 85 y.o. male who presents for the following: Skin Problem (Right forearm x month KA).  Rapid growth Location: Right forearm Duration: 1 month Quality:  Associated Signs/Symptoms: Painful Modifying Factors:  Severity:  Timing: Context: History of multiple skin cancers  Objective  Well appearing patient in no apparent distress; mood and affect are within normal limits. Chest - Medial [facility])  Images    Objective  Left Forearm - Posterior: Rapid growth of 1.6 cm volcano shaped nodule, painful.   CURET AND CAUTERY 5FU  4CM TO SCAR      All skin waist up examined.   Assessment & Plan    Screening exam for skin cancer Chest - Medial ([place])  SCCA (squamous cell carcinoma) of skin Left Forearm - Posterior  Destruction of lesion Complexity: simple   Destruction method: electrodesiccation and curettage   Informed consent: discussed and consent obtained   Timeout:  patient name, date of birth, surgical site, and procedure verified Anesthesia: the lesion was anesthetized in a standard fashion   Anesthetic:  1% lidocaine w/ epinephrine 1-100,000 local infiltration Curettage performed in three different directions: Yes   Curettage cycles:  3 Lesion length (cm):  2 Lesion width (cm):  2 Margin per side (cm):  0 Final wound size (cm):  2 Hemostasis achieved with:  aluminum chloride Outcome: patient tolerated procedure well with no complications   Post-procedure details: wound care instructions given    Specimen 1 - Surgical pathology Differential Diagnosis: KA CURET CAUTERY AND 5FU  Check Margins: No  Kazi's wife, and, was in the room throughout the visit.  Sadly she confirmed what was painfully obvious that Amal has had significant cognitive decline in recent months.   I, Janalyn Harder, MD, have reviewed all documentation for this visit.  The documentation on 12/13/20 for the exam, diagnosis,  procedures, and orders are all accurate and complete.

## 2020-12-16 ENCOUNTER — Other Ambulatory Visit: Payer: Self-pay | Admitting: Internal Medicine

## 2020-12-16 NOTE — Telephone Encounter (Signed)
Please refill as per office routine med refill policy (all routine meds refilled for 3 mo or monthly per pt preference up to one year from last visit, then month to month grace period for 3 mo, then further med refills will have to be denied)  

## 2021-01-18 ENCOUNTER — Other Ambulatory Visit: Payer: Self-pay | Admitting: Internal Medicine

## 2021-01-19 ENCOUNTER — Encounter: Payer: Self-pay | Admitting: Internal Medicine

## 2021-01-19 ENCOUNTER — Ambulatory Visit (INDEPENDENT_AMBULATORY_CARE_PROVIDER_SITE_OTHER): Payer: Medicare Other | Admitting: Internal Medicine

## 2021-01-19 ENCOUNTER — Other Ambulatory Visit: Payer: Self-pay

## 2021-01-19 VITALS — BP 158/80 | HR 84 | Temp 98.5°F | Ht 79.0 in | Wt 210.0 lb

## 2021-01-19 DIAGNOSIS — I10 Essential (primary) hypertension: Secondary | ICD-10-CM

## 2021-01-19 DIAGNOSIS — N1831 Chronic kidney disease, stage 3a: Secondary | ICD-10-CM

## 2021-01-19 DIAGNOSIS — E538 Deficiency of other specified B group vitamins: Secondary | ICD-10-CM

## 2021-01-19 DIAGNOSIS — I499 Cardiac arrhythmia, unspecified: Secondary | ICD-10-CM | POA: Diagnosis not present

## 2021-01-19 DIAGNOSIS — Z0001 Encounter for general adult medical examination with abnormal findings: Secondary | ICD-10-CM

## 2021-01-19 DIAGNOSIS — E559 Vitamin D deficiency, unspecified: Secondary | ICD-10-CM

## 2021-01-19 DIAGNOSIS — Z7901 Long term (current) use of anticoagulants: Secondary | ICD-10-CM | POA: Insufficient documentation

## 2021-01-19 DIAGNOSIS — Z0000 Encounter for general adult medical examination without abnormal findings: Secondary | ICD-10-CM | POA: Diagnosis not present

## 2021-01-19 DIAGNOSIS — I4891 Unspecified atrial fibrillation: Secondary | ICD-10-CM

## 2021-01-19 DIAGNOSIS — E119 Type 2 diabetes mellitus without complications: Secondary | ICD-10-CM

## 2021-01-19 DIAGNOSIS — Z5320 Procedure and treatment not carried out because of patient's decision for unspecified reasons: Secondary | ICD-10-CM

## 2021-01-19 LAB — BASIC METABOLIC PANEL
BUN: 28 mg/dL — ABNORMAL HIGH (ref 6–23)
CO2: 30 mEq/L (ref 19–32)
Calcium: 9.8 mg/dL (ref 8.4–10.5)
Chloride: 103 mEq/L (ref 96–112)
Creatinine, Ser: 1.7 mg/dL — ABNORMAL HIGH (ref 0.40–1.50)
GFR: 37.03 mL/min — ABNORMAL LOW (ref >60.00)
Glucose, Bld: 109 mg/dL — ABNORMAL HIGH (ref 70–99)
Potassium: 4 mEq/L (ref 3.5–5.1)
Sodium: 139 mEq/L (ref 135–145)

## 2021-01-19 LAB — CBC WITH DIFFERENTIAL/PLATELET
Basophils Absolute: 0.1 10*3/uL (ref 0.0–0.1)
Basophils Relative: 0.6 % (ref 0.0–3.0)
Eosinophils Absolute: 0.5 10*3/uL (ref 0.0–0.7)
Eosinophils Relative: 5.8 % — ABNORMAL HIGH (ref 0.0–5.0)
HCT: 49.3 % (ref 39.0–52.0)
Hemoglobin: 15.9 g/dL (ref 13.0–17.0)
Lymphocytes Relative: 15.1 % (ref 12.0–46.0)
Lymphs Abs: 1.4 10*3/uL (ref 0.7–4.0)
MCHC: 32.3 g/dL (ref 30.0–36.0)
MCV: 84.5 fl (ref 78.0–100.0)
Monocytes Absolute: 1 10*3/uL (ref 0.1–1.0)
Monocytes Relative: 10.7 % (ref 3.0–12.0)
Neutro Abs: 6.3 10*3/uL (ref 1.4–7.7)
Neutrophils Relative %: 67.8 % (ref 43.0–77.0)
Platelets: 204 10*3/uL (ref 150.0–400.0)
RBC: 5.84 Mil/uL — ABNORMAL HIGH (ref 4.22–5.81)
RDW: 16 % — ABNORMAL HIGH (ref 11.5–15.5)
WBC: 9.2 10*3/uL (ref 4.0–10.5)

## 2021-01-19 LAB — HEPATIC FUNCTION PANEL
ALT: 12 U/L (ref 0–53)
AST: 18 U/L (ref 0–37)
Albumin: 4.2 g/dL (ref 3.5–5.2)
Alkaline Phosphatase: 137 U/L — ABNORMAL HIGH (ref 39–117)
Bilirubin, Direct: 0.2 mg/dL (ref 0.0–0.3)
Total Bilirubin: 0.9 mg/dL (ref 0.2–1.2)
Total Protein: 7.2 g/dL (ref 6.0–8.3)

## 2021-01-19 LAB — URINALYSIS, ROUTINE W REFLEX MICROSCOPIC
Bilirubin Urine: NEGATIVE
Ketones, ur: NEGATIVE
Leukocytes,Ua: NEGATIVE
Nitrite: NEGATIVE
Specific Gravity, Urine: 1.02 (ref 1.000–1.030)
Total Protein, Urine: 100 — AB
Urine Glucose: 100 — AB
Urobilinogen, UA: 0.2 (ref 0.0–1.0)
pH: 6 (ref 5.0–8.0)

## 2021-01-19 LAB — MICROALBUMIN / CREATININE URINE RATIO
Creatinine,U: 53.6 mg/dL
Microalb Creat Ratio: 287.5 mg/g — ABNORMAL HIGH (ref 0.0–30.0)
Microalb, Ur: 154.2 mg/dL — ABNORMAL HIGH (ref 0.0–1.9)

## 2021-01-19 LAB — LIPID PANEL
Cholesterol: 132 mg/dL (ref 0–200)
HDL: 45.3 mg/dL (ref >39.00)
LDL Cholesterol: 64 mg/dL (ref 0–99)
NonHDL: 86.81
Total CHOL/HDL Ratio: 3
Triglycerides: 116 mg/dL (ref 0.0–149.0)
VLDL: 23.2 mg/dL (ref 0.0–40.0)

## 2021-01-19 LAB — VITAMIN B12: Vitamin B-12: 236 pg/mL (ref 211–911)

## 2021-01-19 LAB — TSH: TSH: 2.06 u[IU]/mL (ref 0.35–4.50)

## 2021-01-19 LAB — HEMOGLOBIN A1C: Hgb A1c MFr Bld: 6.7 % — ABNORMAL HIGH (ref 4.6–6.5)

## 2021-01-19 LAB — VITAMIN D 25 HYDROXY (VIT D DEFICIENCY, FRACTURES): VITD: 34.25 ng/mL (ref 30.00–100.00)

## 2021-01-19 MED ORDER — APIXABAN 2.5 MG PO TABS: 2.5000 mg | ORAL_TABLET | Freq: Two times a day (BID) | ORAL | 11 refills | Status: AC
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
## 2021-01-19 NOTE — Patient Instructions (Addendum)
Your EKG was c/w atrial fibrillation; please call with the name of the cardiology if you like  Please check the BP every day at random times and call in 10 days with the average  Please take the OTC B12 1000 mcg at least twice per wk  Please take OTC Vitamin D3 at 2000 units per day, indefinitely.  Please continue all other medications as before, and refills have been done if requested.  Please have the pharmacy call with any other refills you may need.  Please continue your efforts at being more active, low cholesterol diet, and weight control.  You are otherwise up to date with prevention measures today.  Please keep your appointments with your specialists as you may have planned  Please go to the LAB at the blood drawing area for the tests to be done  You will be contacted by phone if any changes need to be made immediately.  Otherwise, you will receive a letter about your results with an explanation, but please check with MyChart first.  Please remember to sign up for MyChart if you have not done so, as this will be important to you in the future with finding out test results, communicating by private email, and scheduling acute appointments online when needed.  Please make an Appointment to return in 6 months, or sooner if needed

## 2021-01-20 ENCOUNTER — Encounter: Payer: Self-pay | Admitting: Dermatology

## 2021-01-20 ENCOUNTER — Ambulatory Visit: Payer: Medicare Other | Admitting: Dermatology

## 2021-01-20 DIAGNOSIS — Z1283 Encounter for screening for malignant neoplasm of skin: Secondary | ICD-10-CM | POA: Diagnosis not present

## 2021-01-20 DIAGNOSIS — L570 Actinic keratosis: Secondary | ICD-10-CM | POA: Diagnosis not present

## 2021-01-20 DIAGNOSIS — Z85828 Personal history of other malignant neoplasm of skin: Secondary | ICD-10-CM

## 2021-01-20 NOTE — Addendum Note (Signed)
Addended by: Biagio Borg on: 01/20/2021 08:22 AM   Modules accepted: Orders

## 2021-01-24 ENCOUNTER — Encounter: Payer: Self-pay | Admitting: Dermatology

## 2021-01-24 NOTE — Progress Notes (Signed)
   Follow-Up Visit   Subjective  [name] is a 85 y.o. male who presents for the following: Follow-up (3 month f/u- left forearm - posterior- healing good no concerns).  [name] returns to recheck the squamous cell carcinoma removed at the end of December from his left arm.  His wife is with him and reports that his cognitive dysfunction has progressed somewhat, but he remains in good spirits and is always a pleasure to be with. Location:  Duration:  Quality:  Associated Signs/Symptoms: Modifying Factors:  Severity:  Timing: Context:   Objective  Well appearing patient in no apparent distress; mood and affect are within normal limits. Objective  Right Abdomen (side) - Upper: Multiple white scars- clear  Objective  Left Abdomen (side) - Lower: No sign residual  Objective  Left Breast: Waist up skin examination- no atypical moles or non mole skin cancer  Objective  Left Forearm - Posterior: SCCA removed 12/09/2020, no sign recurrence and minimal scar  Objective  Left Hand - Posterior, Right Hand - Posterior: Diffuse actinic keratoses, many hypertrophic on the distal forearms and hands, historically stable and causing no discomfort    All skin waist up examined.  Plus lower lip where he historically also had skin cancer.   Assessment & Plan    History of squamous cell carcinoma of skin Right Abdomen (side) - Upper  Yearly skin check  History of basal cell cancer Left Abdomen (side) - Lower  Yearly skin check  Encounter for screening for malignant neoplasm of skin Left Breast  Recheck in 6 months  Personal history of skin cancer Left Forearm - Posterior  Recheck as needed  AK (actinic keratosis) (2) Left Hand - Posterior; Right Hand - Posterior  Will treat if any grow or bleed or bother [name]      I, Lavonna Monarch, MD, have reviewed all documentation for this visit.  The documentation on 01/24/21 for the exam, diagnosis,  procedures, and orders are all accurate and complete.

## 2021-01-28 ENCOUNTER — Encounter: Payer: Self-pay | Admitting: Internal Medicine

## 2021-01-28 DIAGNOSIS — I4819 Other persistent atrial fibrillation: Secondary | ICD-10-CM | POA: Insufficient documentation

## 2021-01-28 DIAGNOSIS — I4891 Unspecified atrial fibrillation: Secondary | ICD-10-CM | POA: Insufficient documentation

## 2021-01-28 HISTORY — DX: Other persistent atrial fibrillation: I48.19

## 2021-01-28 HISTORY — DX: Unspecified atrial fibrillation: I48.91

## 2021-01-28 NOTE — Assessment & Plan Note (Signed)
Last vitamin D Lab Results  Component Value Date   VD25OH 34.25 01/19/2021   Stable, cont oral replacement

## 2021-01-28 NOTE — Assessment & Plan Note (Signed)
eiquis declined for now per wife, will consider and call back if she accepts for him

## 2021-01-28 NOTE — Assessment & Plan Note (Signed)
Lab Results  Component Value Date   HGBA1C 6.7 (H) 01/19/2021   Stable, pt to continue current medical treatment  - glucotrol, metformin

## 2021-01-28 NOTE — Assessment & Plan Note (Signed)
BP Readings from Last 3 Encounters:  01/19/21 (!) 158/80  07/17/20 120/70  07/10/20 (!) 151/87   Stable, pt to continue medical treatment - norvasc  Current Outpatient Medications (Endocrine & Metabolic):  .  glipiZIDE (GLUCOTROL XL) 10 MG 24 hr tablet, TAKE 1 TABLET BY MOUTH EVERY DAY WITH BREAKFAST .  metFORMIN (GLUCOPHAGE-XR) 500 MG 24 hr tablet, TAKE 1 TABLET BY MOUTH EVERY DAY WITH BREAKFAST .  testosterone cypionate (DEPOTESTOSTERONE CYPIONATE) 200 MG/ML injection, INJECT 1 MILLILITER INTO THE MUSCLE EVERY 14 DAYS  Current Outpatient Medications (Cardiovascular):  .  amLODipine (NORVASC) 5 MG tablet, Take 5 mg by mouth 2 (two) times daily. Marland Kitchen  atorvastatin (LIPITOR) 20 MG tablet, TAKE 1 TABLET BY MOUTH EVERY DAY .  furosemide (LASIX) 40 MG tablet, 1 tab by mouth each AM, and 1 tab in afternoon as needed for persistent swelling .  sildenafil (VIAGRA) 100 MG tablet, Take 0.5-1 tablets (50-100 mg total) by mouth daily as needed for erectile dysfunction. (Patient not taking: Reported on 01/19/2021) .  tadalafil (CIALIS) 20 MG tablet, Take 0.5-1 tablets (10-20 mg total) by mouth every other day as needed for erectile dysfunction. (Patient not taking: Reported on 01/19/2021)   Current Outpatient Medications (Analgesics):  .  allopurinol (ZYLOPRIM) 100 MG tablet, TAKE 1 TABLET BY MOUTH EVERY DAY .  colchicine 0.6 MG tablet, TAKE 1 TABLET (0.6 MG TOTAL) BY MOUTH 2 (TWO) TIMES DAILY.  Current Outpatient Medications (Hematological):  .  apixaban (ELIQUIS) 2.5 MG TABS tablet, Take 1 tablet (2.5 mg total) by mouth 2 (two) times daily. .  vitamin B-12 (CYANOCOBALAMIN) 1000 MCG tablet, Take 1 tablet (1,000 mcg total) by mouth daily.  Current Outpatient Medications (Other):  .  glucose blood (ONE TOUCH ULTRA TEST) test strip, 1 each by Other route as needed for other. Use to check blood sugars twice a day  E11.9 .  pantoprazole (PROTONIX) 40 MG tablet, TAKE 1 TABLET BY MOUTH EVERY DAY BEFORE  BREAKFAST .  rivastigmine (EXELON) 1.5 MG capsule, Take 1 capsule (1.5 mg total) by mouth 2 (two) times daily. .  diazepam (VALIUM) 5 MG tablet, 1 tab by mouth 1 hour prior to procedure (Patient not taking: Reported on 01/19/2021)

## 2021-01-28 NOTE — Assessment & Plan Note (Signed)
New onset, rate controlled, volume ok, cont same tx, but for echo, and cardiology referral if ok with wife who wants to consider first who to see

## 2021-01-28 NOTE — Assessment & Plan Note (Signed)
Age and sex appropriate education and counseling updated with regular exercise and diet Referrals for preventative services - none needed - plans to see optho on his own per wife Immunizations addressed - none needed Smoking counseling  - none needed Evidence for depression or other mood disorder - none significant Most recent labs reviewed. I have personally reviewed and have noted: 1) the patient's medical and social history 2) The patient's current medications and supplements 3) The patient's height, weight, and BMI have been recorded in the chart

## 2021-01-28 NOTE — Assessment & Plan Note (Signed)
Overall mild worsening recently, ? Overdiuresed, for lasix qd only

## 2021-02-01 ENCOUNTER — Telehealth: Payer: Self-pay | Admitting: Cardiology

## 2021-02-01 NOTE — Telephone Encounter (Signed)
New message:    Patient wife calling stating that the patient has a up coming apt and states that her and the step daughter need to be a his apt; please advise.

## 2021-02-01 NOTE — Telephone Encounter (Signed)
Returned call to wife (ok per DPR)-states patient has alzheimers and it takes 2 to help him.   Request daughter really wants to be at appt and speak to Dr. Ellyn Hack.  Advised ok to come to appt.

## 2021-02-04 ENCOUNTER — Other Ambulatory Visit: Payer: Self-pay

## 2021-02-04 ENCOUNTER — Ambulatory Visit: Payer: Medicare Other | Admitting: Cardiology

## 2021-02-04 VITALS — BP 150/96 | HR 84 | Ht 78.0 in | Wt 214.2 lb

## 2021-02-04 DIAGNOSIS — I5032 Chronic diastolic (congestive) heart failure: Secondary | ICD-10-CM

## 2021-02-04 DIAGNOSIS — I10 Essential (primary) hypertension: Secondary | ICD-10-CM | POA: Diagnosis not present

## 2021-02-04 DIAGNOSIS — I4819 Other persistent atrial fibrillation: Secondary | ICD-10-CM
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
## 2021-02-04 NOTE — Patient Instructions (Addendum)
Medication Instructions:  No changes  *If you need a refill on your cardiac medications before your next appointment, please call your pharmacy*   Lab Work: Not needed   Testing/Procedures: Will be schedule at [facility] has requested that you have an echocardiogram. Echocardiography is a painless test that uses sound waves to create images of your heart. It provides your doctor with information about the size and shape of your heart and how well your heart's chambers and valves are working. This procedure takes approximately one hour. There are no restrictions for this procedure.     Follow-Up: At [facility], you and your health needs are our priority.  As part of our continuing mission to provide you with exceptional heart care, we have created designated Provider Care Teams.  These Care Teams include your primary Cardiologist (physician) and Advanced Practice Providers (APPs -  Physician Assistants and Nurse Practitioners) who all work together to provide you with the care you need, when you need it.     Your next appointment:    1 to 2 month(s)  The format for your next appointment:   In Person  Provider:   [name], MD

## 2021-02-13 ENCOUNTER — Other Ambulatory Visit: Payer: Self-pay | Admitting: Internal Medicine

## 2021-02-13 NOTE — Telephone Encounter (Signed)
Please refill as per office routine med refill policy (all routine meds refilled for 3 mo or monthly per pt preference up to one year from last visit, then month to month grace period for 3 mo, then further med refills will have to be denied)  

## 2021-02-14 ENCOUNTER — Encounter: Payer: Self-pay | Admitting: Cardiology

## 2021-02-14 NOTE — Assessment & Plan Note (Signed)
Listed as a diagnosis.  He had grade 1 diastolic dysfunction on echo.  I suspect that the edema is probably more related to venous stasis and his height.  Edema well controlled with Lasix.  Would not be overly aggressive.

## 2021-02-14 NOTE — Assessment & Plan Note (Signed)
Newly diagnosed A. fib.  Seems to be persistent.  Likely present since February.  Completely asymptomatic.  CHA2DS2-VASc score is 5 with hypertension, diabetes, aortic plaque and age 85. ->  After consideration, he decided to agree with taking Eliquis.  Seems to be tolerating it well.  The wife and his 1 daughter present with him today are very thankful for the explanation as to why he is on Eliquis and are happy that he is started on low-dose Eliquis.  He is rate controlled without any medications, I would not start any medication at this time.  I do agree that an echocardiogram would be helpful just to get new baseline, but my understanding from discussing with the wife and the 1 daughter present is that we do not need to pursue any ischemic evaluation with Myoview as he would not be interested in cardiac catheterization or other invasive procedures.  Plan: Continue Eliquis, check 2D echo.

## 2021-02-14 NOTE — Assessment & Plan Note (Signed)
Blood pressure is up and down.  Has been high of late.  Was also packed Dr. Gwynn Burly office.  He is on amlodipine 5 mg.  If his pressures remain elevated, would consider potentially starting low-dose beta-blocker, however, given advanced age and dementia, would probably allow for mild permissive hypertension as he is asymptomatic.

## 2021-02-15 NOTE — Telephone Encounter (Signed)
Ok this time - done erx

## 2021-03-02 ENCOUNTER — Other Ambulatory Visit: Payer: Self-pay

## 2021-03-02 ENCOUNTER — Ambulatory Visit (HOSPITAL_COMMUNITY): Payer: Medicare Other | Attending: Cardiovascular Disease

## 2021-03-02 DIAGNOSIS — I4819 Other persistent atrial fibrillation: Secondary | ICD-10-CM | POA: Insufficient documentation

## 2021-03-02 LAB — ECHOCARDIOGRAM COMPLETE
AR max vel: 1.9 cm2
AV Area VTI: 2.01 cm2
AV Area mean vel: 1.92 cm2
AV Mean grad: 10.3 mmHg
AV Peak grad: 18.1 mmHg
Ao pk vel: 2.13 m/s
S' Lateral: 3.5 cm

## 2021-03-03 ENCOUNTER — Other Ambulatory Visit: Payer: Self-pay

## 2021-03-03 DIAGNOSIS — I4819 Other persistent atrial fibrillation: Secondary | ICD-10-CM

## 2021-03-17 ENCOUNTER — Other Ambulatory Visit: Payer: Self-pay

## 2021-03-17 ENCOUNTER — Encounter: Payer: Self-pay | Admitting: Cardiology

## 2021-03-17 ENCOUNTER — Ambulatory Visit: Payer: Medicare Other | Admitting: Cardiology

## 2021-03-17 VITALS — BP 140/80 | HR 76 | Ht 78.5 in | Wt 210.4 lb

## 2021-03-17 DIAGNOSIS — I10 Essential (primary) hypertension: Secondary | ICD-10-CM | POA: Diagnosis not present

## 2021-03-17 DIAGNOSIS — I5032 Chronic diastolic (congestive) heart failure: Secondary | ICD-10-CM | POA: Diagnosis not present

## 2021-03-17 DIAGNOSIS — I4819 Other persistent atrial fibrillation: Secondary | ICD-10-CM | POA: Diagnosis not present

## 2021-03-17 NOTE — Patient Instructions (Signed)
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
## 2021-04-02 ENCOUNTER — Encounter: Payer: Self-pay | Admitting: Cardiology

## 2021-04-02 NOTE — Assessment & Plan Note (Signed)
Relatively normal echocardiogram.  EF is a little bit down.  The last time which may be related to atrial fib.  No heart symptoms. Exam based on mom discussion, plan would be to therapy with DOAC for stroke prevention.  He is now on reduced dose Eliquis tolerating well.  Remains adequately rate controlled without rate control agent.  Given advanced age, would avoid AV nodal agents to avoid potential bradycardia.

## 2021-04-02 NOTE — Assessment & Plan Note (Addendum)
Difficult assess if this is truly diastolic heart failure.  Echocardiogram could not evaluate diastolic function atrial fibrillation.  He is not having any exertional dyspnea symptoms or active CHF symptoms.  Edema does not seem to be related to CHF, more related to venous stasis.  He is on stable dose of diuretic with standing dose and as needed dosing.

## 2021-04-02 NOTE — Assessment & Plan Note (Signed)
Borderline blood pressure, but given advanced age, would probably allow for mild permissive hypertension.  Continue amlodipine.

## 2021-05-04 ENCOUNTER — Other Ambulatory Visit: Payer: Self-pay | Admitting: Internal Medicine

## 2021-06-10 ENCOUNTER — Encounter: Payer: Self-pay | Admitting: Internal Medicine

## 2021-06-24 ENCOUNTER — Encounter: Payer: Self-pay | Admitting: Internal Medicine

## 2021-06-28 ENCOUNTER — Telehealth: Payer: Self-pay

## 2021-06-28 NOTE — Telephone Encounter (Signed)
Patients wife called on behalf of patient stating he had a fall about 2 weeks ago and has had hip pain since. She has been trying Tylenol. Wanted to know if Dr Durward Fortes could work him in early next week? Also wanted to know what else they can try for pain until patient can get an appointment

## 2021-06-28 NOTE — Telephone Encounter (Signed)
Called and rescheduled appointment to next Tuesday.

## 2021-06-29 ENCOUNTER — Ambulatory Visit: Payer: Medicare Other | Admitting: Dermatology

## 2021-06-29 ENCOUNTER — Encounter: Payer: Self-pay | Admitting: Dermatology

## 2021-06-29 ENCOUNTER — Other Ambulatory Visit: Payer: Self-pay

## 2021-06-29 DIAGNOSIS — L729 Follicular cyst of the skin and subcutaneous tissue, unspecified: Secondary | ICD-10-CM | POA: Diagnosis not present

## 2021-06-30 ENCOUNTER — Other Ambulatory Visit: Payer: Self-pay | Admitting: Orthopaedic Surgery

## 2021-06-30 ENCOUNTER — Encounter: Payer: Self-pay | Admitting: Orthopaedic Surgery

## 2021-06-30 MED ORDER — HYDROCODONE-ACETAMINOPHEN 5-325 MG PO TABS: 1.0000 | ORAL_TABLET | Freq: Four times a day (QID) | ORAL | 0 refills | Status: DC | PRN

## 2021-06-30 MED ORDER — HYDROCODONE-ACETAMINOPHEN 5-325 MG PO TABS: 1.0000 | ORAL_TABLET | Freq: Four times a day (QID) | ORAL | 0 refills | Status: AC | PRN

## 2021-07-01 NOTE — Progress Notes (Addendum)
Assessment/Plan:    Late Onset Alzheimer's Dementia with Behavioral disturbance  Last MoCA was 6/30.  Today, his MMSE 9/30= MoCA 3/30 . Last MRI of the brain showed moderate diffuse atrophy and mild to moderate chronic microvascular disease.  He is currently on rivastigmine 3 mg twice daily, tolerating well.  He is having more behavioral changes.   Recommendations:  Discussed safety both in and out of the home.  Discussed the importance of regular daily schedule with inclusion of crossword puzzles to maintain brain function.  Continue to monitor mood. Will start Lexapro 10 mg daily  Continue rivastigmine 3 mg twice daily.  Continue to monitor driving Naps should be scheduled and should be no longer than 60 minutes and should not occur after 2 PM.  Mediterranean diet is recommended  Follow up in 4-6 months.   Case discussed with Dr. Delice Lesch who agrees with the plan      Subjective:   ED visits since last seen: none  Hospital admissions: none  [name] is a 85 y.o. [place] male with a history of hypertension, hyperlipidemia, diabetes, persistent Afib  on AC, [name], and history of Alzheimer's disease without behavioral disturbance seen today in follow up. This patient is accompanied in the office by his wife, and his daughter [name] on the phone, who supplement the history.  Previous records as well as any outside records available were reviewed prior to todays visit.  he  is currently on rivastigmine 3 mg bid.  He had a mechanical fall last month due to neuropathy, being followed by Dr. Durward Fortes. He had a significant degree of pain controlled with meds but no fractures were noted. He has been falling more frequently than before so he uses the cane more often than before.The family reports that memory and comprehension are worsening, especially short term, provoking more irritability on him than before. No depression is reported by wife.No hallucinations or paranoia . He sleeps well, no  vivid dreams or sleepwalking, He continues to bathe and dress with assistance. His wife manages the medications, finances and meals. Since last week, his family took the car keys because of memory, mood and pain issues. Denies headaches,  injuries to the head, double vision, or dizziness. He has chronic bilateral foot numbness. No unilateral weakness. He has occasional tremors. Denies urine incontinence or retention, constipation or diarrhea.    INITIAL HISTORY OF PRESENT ILLNESS 10/20/20: This is an 85 year old left-handed man with a history of hypertension, hyperlipidemia, diabetes, [name], presenting for evaluation of memory loss. He is accompanied by his wife who helps supplement the history today. He feels his memory is pretty good. His wife started noticing changes a couple of years ago where he would misplace things frequently. Over the past 6 months, she has had concerns about medication compliance. It has been a sore spot for her to help him, he wants to do his medications by himself, but recently she saw 2 pills on the floor. He has problems following instructions, she states he does not hear well and refuses to wear his hearing aids. He has so far done well with driving, however yesterday she got concerned when he pulled out in front of someone. He denies getting lost driving. His wife manages finances, he does fine with the bills he had to pay and can still write a check. He was started on Donepezil which he took for a couple of months, however he had diarrhea and lost 20 lbs. Diarrhea stopped with  discontinuation of Donepezil. His wife reports that he is more anxious at night, one time she went out and had 28 calls on her phone. He gets a little upset and says "she is not telling the whole story." No paranoia or hallucinations.   He denies any headaches, dizziness, diplopia, dysarthria, dysphagia, neck/back pain, focal numbness/tingling/weakness, bowel/bladder dysfunction. No anosmia, no recent falls.  His wife reports numbness in both feet. He has occasional hand tremors. He reports his sleep is good, his wife notes he gets up most of the night and falls asleep on his chair. She has noted snoring and possible apneic episodes. He has occasional daytime drowsiness. No family history of dementia. He denies any significant head injuries. He rarely drinks alcohol. [place]    I personally reviewed MRI brain without contrast done 08/2020 which did not show any acute changes. There was moderate diffuse atrophy and mild to moderate chronic microvascular disease.     Laboratory Data:      Lab Results  Component Value Date    TSH 1.52 01/23/2020         Lab Results  Component Value Date    VITAMINB12 285 07/17/2020  [place] MEDICATIONS: Donepezil (dc due to diarrhea)  CURRENT MEDICATIONS:  Outpatient Encounter Medications as of 07/02/2021  Medication Sig   allopurinol (ZYLOPRIM) 100 MG tablet TAKE 1 TABLET BY MOUTH EVERY DAY   amLODipine (NORVASC) 5 MG tablet TAKE 1 TABLET BY MOUTH TWICE A DAY   apixaban (ELIQUIS) 2.5 MG TABS tablet Take 1 tablet (2.5 mg total) by mouth 2 (two) times daily.   atorvastatin (LIPITOR) 20 MG tablet TAKE 1 TABLET BY MOUTH EVERY DAY   Cholecalciferol (VITAMIN D3) 1.25 MG (50000 UT) TABS Take by mouth.   colchicine 0.6 MG tablet TAKE 1 TABLET (0.6 MG TOTAL) BY MOUTH 2 (TWO) TIMES DAILY.   furosemide (LASIX) 40 MG tablet 1 tab by mouth each AM, and 1 tab in afternoon as needed for persistent swelling   glipiZIDE (GLUCOTROL XL) 10 MG 24 hr tablet TAKE 1 TABLET BY MOUTH EVERY DAY WITH BREAKFAST   glucose blood (ONE TOUCH ULTRA TEST) test strip 1 each by Other route as needed for other. Use to check blood sugars twice a day  E11.9   HYDROcodone-acetaminophen (NORCO/VICODIN) 5-325 MG tablet Take 1 tablet by mouth every 6 (six) hours as needed for moderate pain.   metFORMIN (GLUCOPHAGE-XR) 500 MG 24 hr tablet TAKE 1 TABLET BY MOUTH EVERY DAY  WITH BREAKFAST   pantoprazole (PROTONIX) 40 MG tablet TAKE 1 TABLET BY MOUTH EVERY DAY BEFORE BREAKFAST   rivastigmine (EXELON) 1.5 MG capsule Take 1 capsule (1.5 mg total) by mouth 2 (two) times daily.   testosterone cypionate (DEPOTESTOSTERONE CYPIONATE) 200 MG/ML injection INJECT 1 MILLILITER INTO THE MUSCLE EVERY 14 DAYS   vitamin B-12 (CYANOCOBALAMIN) 1000 MCG tablet Take 1 tablet (1,000 mcg total) by mouth daily.   HYDROcodone-acetaminophen (NORCO/VICODIN) 5-325 MG tablet Take 1 tablet by mouth every 6 (six) hours as needed for moderate pain.   No facility-administered encounter medications on file as of 07/02/2021.     Objective:     PHYSICAL EXAMINATION:    VITALS:   Vitals:   07/02/21 1455  BP: (!) 150/83  Pulse: (!) 101  SpO2: 96%  Weight: 206 lb 3.2 oz (93.5 kg)  Height: 6\' 2"  (1.88 m)    GEN:  The patient appears stated age and is in NAD. HEENT:  Normocephalic, atraumatic.   Neurological examination:  General: NAD, well-groomed, appears stated age. Orientation: The patient is alert. Oriented to person, not to place or date. The year is "81" and the date is "mail". Only the month is correct "July".  Cranial nerves: There is good facial symmetry.The speech is not fluent and clear  No aphasia or dysarthria. Fund of knowledge is reduced. Recent and remote memory are impaired. Attention and concentration are reduced.  Able to name objects and unable to repeat phrases.  Hearing is intact to conversational tone.    Sensation: Sensation is intact to light touch throughout Motor: Strength is at least antigravity x4.   Montreal Cognitive Assessment  10/20/2020  Visuospatial/ Executive (0/5) 1  Naming (0/3) 2  Attention: Read list of digits (0/2) 1  Attention: Read list of letters (0/1) 0  Attention: Serial 7 subtraction starting at 100 (0/3) 1  Language: Repeat phrase (0/2) 0  Language : Fluency (0/1) 0  Abstraction (0/2) 0  Delayed Recall (0/5) 0  Orientation (0/6) 0   Total 5  Adjusted Score (based on education) 6     MMSE - Mini Mental State Exam 07/04/2021  Orientation to time 1  Orientation to Place 1  Registration 1  Attention/ Calculation 0  Recall 0  Language- name 2 objects 2  Language- repeat 0  Language- follow 3 step command 3  Language- read & follow direction 1  Write a sentence 0  Copy design 0  Total score 9      Movement examination: Tone: There is normal tone in the UE/LE Abnormal movements:  no tremor.  No myoclonus.  No asterixis.  Reflexes 1/4 Coordination:  There is no decremation with RAM's. Normal finger to nose  Gait and Station: The patient has difficulty arising out of a deep-seated chair without the use of the hands.  Gait is showing short stride, with reduced arm swing, without any other Parkinsonian signs   CBC CBC Latest Ref Rng & Units 01/19/2021 01/23/2020 01/28/2019  WBC 4.0 - 10.5 K/uL 9.2 8.9 7.9  Hemoglobin 13.0 - 17.0 g/dL 15.9 14.5 14.4  Hematocrit 39.0 - 52.0 % 49.3 45.3 43.1  Platelets 150.0 - 400.0 K/uL 204.0 234.0 227.0     CMP Latest Ref Rng & Units 01/19/2021 07/17/2020 01/23/2020  Glucose 70 - 99 mg/dL 109(H) 237(H) 203(H)  BUN 6 - 23 mg/dL 28(H) 28(H) 19  Creatinine 0.40 - 1.50 mg/dL 1.70(H) 1.89(H) 1.49  Sodium 135 - 145 mEq/L 139 140 139  Potassium 3.5 - 5.1 mEq/L 4.0 4.3 3.9  Chloride 96 - 112 mEq/L 103 106 103  CO2 19 - 32 mEq/L 30 22 27   Calcium 8.4 - 10.5 mg/dL 9.8 9.2 9.4  Total Protein 6.0 - 8.3 g/dL 7.2 6.1 6.9  Total Bilirubin 0.2 - 1.2 mg/dL 0.9 0.5 0.5  Alkaline Phos 39 - 117 U/L 137(H) - 137(H)  AST 0 - 37 U/L 18 14 15   ALT 0 - 53 U/L 12 9 11        Total time spent on today's visit was 30 minutes, including both face-to-face time and nonface-to-face time. Time included that spent on review of records (prior notes available to me/labs/imaging if pertinent), discussing treatment and goals, answering patient's questions and coordinating care.  Cc:  Biagio Borg, MD Sharene Butters, PA-C

## 2021-07-02 ENCOUNTER — Encounter: Payer: Self-pay | Admitting: Physician Assistant

## 2021-07-02 ENCOUNTER — Ambulatory Visit: Payer: Medicare Other | Admitting: Physician Assistant

## 2021-07-02 ENCOUNTER — Ambulatory Visit: Payer: Medicare Other | Admitting: Neurology

## 2021-07-02 ENCOUNTER — Other Ambulatory Visit: Payer: Self-pay

## 2021-07-02 VITALS — BP 150/83 | HR 101 | Ht 74.0 in | Wt 206.2 lb

## 2021-07-02 DIAGNOSIS — F0280 Dementia in other diseases classified elsewhere without behavioral disturbance: Secondary | ICD-10-CM

## 2021-07-02 DIAGNOSIS — G301 Alzheimer's disease with late onset: Secondary | ICD-10-CM | POA: Diagnosis not present

## 2021-07-02 NOTE — Patient Instructions (Addendum)
1. Continue Rivastigmine  '3mg'$  twice a day  2. Start Lexapro 10 mg daily for mood   3.Continue doing medications together with your wife  4. Check B12  4. Follow-up in 4-6 months, call for any changes   FALL PRECAUTIONS: Be cautious when walking. Scan the area for obstacles that may increase the risk of trips and falls. When getting up in the mornings, sit up at the edge of the bed for a few minutes before getting out of bed. Consider elevating the bed at the head end to avoid drop of blood pressure when getting up. Walk always in a well-lit room (use night lights in the walls). Avoid area rugs or power cords from appliances in the middle of the walkways. Use a walker or a cane if necessary and consider physical therapy for balance exercise. Get your eyesight checked regularly.  FINANCIAL OVERSIGHT: Supervision, especially oversight when making financial decisions or transactions is also recommended.  HOME SAFETY: Consider the safety of the kitchen when operating appliances like stoves, microwave oven, and blender. Consider having supervision and share cooking responsibilities until no longer able to participate in those. Accidents with firearms and other hazards in the house should be identified and addressed as well.  DRIVING: Regarding driving, in patients with progressive memory problems, driving will be impaired. We advise to have someone else do the driving if trouble finding directions or if minor accidents are reported. Independent driving assessment is available to determine safety of driving.  ABILITY TO BE LEFT ALONE: If patient is unable to contact 911 operator, consider using LifeLine, or when the need is there, arrange for someone to stay with patients. Smoking is a fire hazard, consider supervision or cessation. Risk of wandering should be assessed by caregiver and if detected at any point, supervision and safe proof recommendations should be instituted.  MEDICATION SUPERVISION:  Inability to self-administer medication needs to be constantly addressed. Implement a mechanism to ensure safe administration of the medications.  RECOMMENDATIONS FOR ALL PATIENTS WITH MEMORY PROBLEMS: 1. Continue to exercise (Recommend 30 minutes of walking everyday, or 3 hours every week) 2. Increase social interactions - continue going to [place] and enjoy social gatherings with friends and family 3. Eat healthy, avoid fried foods and eat more fruits and vegetables 4. Maintain adequate blood pressure, blood sugar, and blood cholesterol level. Reducing the risk of stroke and cardiovascular disease also helps promoting better memory. 5. Avoid stressful situations. Live a simple life and avoid aggravations. Organize your time and prepare for the next day in anticipation. 6. Sleep well, avoid any interruptions of sleep and avoid any distractions in the bedroom that may interfere with adequate sleep quality 7. Avoid sugar, avoid sweets as there is a strong link between excessive sugar intake, diabetes, and cognitive impairment The Mediterranean diet has been shown to help patients reduce the risk of progressive memory disorders and reduces cardiovascular risk. This includes eating fish, eat fruits and green leafy vegetables, nuts like almonds and hazelnuts, walnuts, and also use olive oil. Avoid fast foods and fried foods as much as possible. Avoid sweets and sugar as sugar use has been linked to worsening of memory function.  There is always a concern of gradual progression of memory problems. If this is the case, then we may need to adjust level of care according to patient needs. Support, both to the patient and caregiver, should then be put into place.

## 2021-07-05 LAB — ANAEROBIC AND AEROBIC CULTURE
MICRO NUMBER:: 12136260
MICRO NUMBER:: 12136261
SPECIMEN QUALITY:: ADEQUATE
SPECIMEN QUALITY:: ADEQUATE

## 2021-07-05 MED ORDER — ESCITALOPRAM OXALATE 10 MG PO TABS: 10.0000 mg | ORAL_TABLET | Freq: Every day | ORAL | 3 refills | Status: DC

## 2021-07-06 ENCOUNTER — Ambulatory Visit: Payer: Medicare Other | Admitting: Orthopaedic Surgery

## 2021-07-08 ENCOUNTER — Ambulatory Visit: Payer: Medicare Other | Admitting: Orthopaedic Surgery

## 2021-07-08 ENCOUNTER — Encounter: Payer: Self-pay | Admitting: Dermatology

## 2021-07-08 NOTE — Progress Notes (Signed)
   Follow-Up Visit   Subjective  [name] is a 85 y.o. male who presents for the following: Skin Problem (New lesion x 2 weeks- left post shoulder- draining & sore-- ? cyst).  Painful draining lesion back of left shoulder Location:  Duration:  Quality:  Associated Signs/Symptoms: Modifying Factors:  Severity:  Timing: Context:   Objective  Well appearing patient in no apparent distress; mood and affect are within normal limits. Left Upper Back Fluctuant 3 cm tender nodule with some drainage.    All skin waist up examined.   Assessment & Plan    Cyst of skin Left Upper Back  11.  Blade I&D and a pulled expression of 3 cc of purulent keratinous material.  No definite cyst wall removed.  Culture obtained.  Related Procedures Anaerobic and Aerobic Culture      I, Lavonna Monarch, MD, have reviewed all documentation for this visit.  The documentation on 07/08/21 for the exam, diagnosis, procedures, and orders are all accurate and complete.

## 2021-07-13 ENCOUNTER — Other Ambulatory Visit: Payer: Self-pay | Admitting: Gastroenterology

## 2021-07-13 ENCOUNTER — Other Ambulatory Visit: Payer: Self-pay | Admitting: Internal Medicine

## 2021-07-13 ENCOUNTER — Ambulatory Visit: Payer: Medicare Other | Admitting: Orthopaedic Surgery

## 2021-07-13 NOTE — Telephone Encounter (Signed)
Please refill as per office routine med refill policy (all routine meds refilled for 3 mo or monthly per pt preference up to one year from last visit, then month to month grace period for 3 mo, then further med refills will have to be denied)  

## 2021-07-15 NOTE — Telephone Encounter (Signed)
Not really sure what to do with the situation.  The options other than Eliquis would be Xarelto or Pradaxa, both of which would probably have a similar issue with needing assistance.  Other than DOAC options, warfarin is an option versus simply not taking oral anticoagulation at all and going back to taking aspirin 81 mg daily.  Switching to aspirin alone would mean foregoing 50% of the protection that to get against stroke, but is financially much more doable.  I do not think that warfarin would be a great idea for him simply because of the difficulty with getting routine checks done.  Eliquis has the greatest safety data which is why I chose it.  We can either consider converting to once daily Xarelto.  But if they are not willing to put out information for medical assistance then I am not sure what we can do.  I agree that we cannot provide enough samples for him to be on an every day.  The options then are either DOAC which would mean probably needing to do some type of financial assistance that requires providing certain information or simply taking aspirin.  [name], MD

## 2021-07-15 NOTE — Telephone Encounter (Signed)
Called pt's hm number wife answered, she states that pt cannot talk on the phone. Verified pt info with wife. She states that pt and daughter refuse to give out personal info for the pt assistance to be approved. Notified that pt assistance is what we have "all the pt's to provide for pt assistance"/free medication. Informed wife that we cannot provide samples for patients all the time as we have limited supply. She states that pt will need an alternate medication for medication as they refuse to supple pt assistance information. Wife states that she will discuss this with pt/daughter but she still thinks that pt/daughter will still refuse but will call back if they change their mind. Pt will need a alternate medication. Please advise.

## 2021-07-16 ENCOUNTER — Other Ambulatory Visit: Payer: Self-pay

## 2021-07-16 ENCOUNTER — Encounter: Payer: Self-pay | Admitting: Internal Medicine

## 2021-07-16 ENCOUNTER — Ambulatory Visit (INDEPENDENT_AMBULATORY_CARE_PROVIDER_SITE_OTHER): Payer: Medicare Other | Admitting: Internal Medicine

## 2021-07-16 VITALS — BP 120/76 | HR 46 | Temp 98.3°F | Ht 74.0 in | Wt 200.0 lb

## 2021-07-16 DIAGNOSIS — E538 Deficiency of other specified B group vitamins: Secondary | ICD-10-CM

## 2021-07-16 DIAGNOSIS — Z7901 Long term (current) use of anticoagulants: Secondary | ICD-10-CM

## 2021-07-16 DIAGNOSIS — R001 Bradycardia, unspecified: Secondary | ICD-10-CM | POA: Diagnosis not present

## 2021-07-16 DIAGNOSIS — E119 Type 2 diabetes mellitus without complications: Secondary | ICD-10-CM | POA: Diagnosis not present

## 2021-07-16 DIAGNOSIS — F0390 Unspecified dementia without behavioral disturbance: Secondary | ICD-10-CM | POA: Diagnosis not present

## 2021-07-16 DIAGNOSIS — I4819 Other persistent atrial fibrillation: Secondary | ICD-10-CM

## 2021-07-16 DIAGNOSIS — E559 Vitamin D deficiency, unspecified: Secondary | ICD-10-CM | POA: Diagnosis not present

## 2021-07-16 DIAGNOSIS — N1831 Chronic kidney disease, stage 3a: Secondary | ICD-10-CM

## 2021-07-16 LAB — BASIC METABOLIC PANEL
BUN: 40 mg/dL — ABNORMAL HIGH (ref 6–23)
CO2: 28 mEq/L (ref 19–32)
Calcium: 9.6 mg/dL (ref 8.4–10.5)
Chloride: 103 mEq/L (ref 96–112)
Creatinine, Ser: 2.08 mg/dL — ABNORMAL HIGH (ref 0.40–1.50)
GFR: 28.97 mL/min — ABNORMAL LOW (ref >60.00)
Glucose, Bld: 81 mg/dL (ref 70–99)
Potassium: 4.2 mEq/L (ref 3.5–5.1)
Sodium: 139 mEq/L (ref 135–145)

## 2021-07-16 LAB — CBC WITH DIFFERENTIAL/PLATELET
Basophils Absolute: 0.1 10*3/uL (ref 0.0–0.1)
Basophils Relative: 0.8 % (ref 0.0–3.0)
Eosinophils Absolute: 0.2 10*3/uL (ref 0.0–0.7)
Eosinophils Relative: 2.2 % (ref 0.0–5.0)
HCT: 45.5 % (ref 39.0–52.0)
Hemoglobin: 15 g/dL (ref 13.0–17.0)
Lymphocytes Relative: 14.8 % (ref 12.0–46.0)
Lymphs Abs: 1.1 10*3/uL (ref 0.7–4.0)
MCHC: 32.9 g/dL (ref 30.0–36.0)
MCV: 88.6 fl (ref 78.0–100.0)
Monocytes Absolute: 0.8 10*3/uL (ref 0.1–1.0)
Monocytes Relative: 11.2 % (ref 3.0–12.0)
Neutro Abs: 5.3 10*3/uL (ref 1.4–7.7)
Neutrophils Relative %: 71 % (ref 43.0–77.0)
Platelets: 230 10*3/uL (ref 150.0–400.0)
RBC: 5.13 Mil/uL (ref 4.22–5.81)
RDW: 17.3 % — ABNORMAL HIGH (ref 11.5–15.5)
WBC: 7.5 10*3/uL (ref 4.0–10.5)

## 2021-07-16 LAB — HEPATIC FUNCTION PANEL
ALT: 18 U/L (ref 0–53)
AST: 23 U/L (ref 0–37)
Albumin: 4.1 g/dL (ref 3.5–5.2)
Alkaline Phosphatase: 151 U/L — ABNORMAL HIGH (ref 39–117)
Bilirubin, Direct: 0.2 mg/dL (ref 0.0–0.3)
Total Bilirubin: 0.9 mg/dL (ref 0.2–1.2)
Total Protein: 7.2 g/dL (ref 6.0–8.3)

## 2021-07-16 LAB — LIPID PANEL
Cholesterol: 117 mg/dL (ref 0–200)
HDL: 40.9 mg/dL (ref >39.00)
LDL Cholesterol: 51 mg/dL (ref 0–99)
NonHDL: 76.19
Total CHOL/HDL Ratio: 3
Triglycerides: 125 mg/dL (ref 0.0–149.0)
VLDL: 25 mg/dL (ref 0.0–40.0)

## 2021-07-16 LAB — HEMOGLOBIN A1C: Hgb A1c MFr Bld: 6.2 % (ref 4.6–6.5)

## 2021-07-16 LAB — TSH: TSH: 1.29 u[IU]/mL (ref 0.35–5.50)

## 2021-07-16 LAB — VITAMIN B12: Vitamin B-12: 1348 pg/mL — ABNORMAL HIGH (ref 211–911)

## 2021-07-16 LAB — VITAMIN D 25 HYDROXY (VIT D DEFICIENCY, FRACTURES): VITD: 42.26 ng/mL (ref 30.00–100.00)

## 2021-07-16 MED ORDER — GLIPIZIDE ER 5 MG PO TB24: 5.0000 mg | ORAL_TABLET | Freq: Every day | ORAL | 3 refills | Status: AC

## 2021-07-16 NOTE — Patient Instructions (Signed)
Ok to decrease the glipizide ER 5 mg to avoid low sugars   Please continue all other medications as before, and refills have been done if requested.  Please have the pharmacy call with any other refills you may need.  Please continue your efforts at being more active, low cholesterol diet, and weight control.  Please keep your appointments with your specialists as you may have planned  Please go to the LAB at the blood drawing area for the tests to be done  You will be contacted by phone if any changes need to be made immediately.  Otherwise, you will receive a letter about your results with an explanation, but please check with MyChart first.  Please remember to sign up for MyChart if you have not done so, as this will be important to you in the future with finding out test results, communicating by private email, and scheduling acute appointments online when needed.  Please make an Appointment to return in 6 months, or sooner if needed,
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
## 2021-07-17 ENCOUNTER — Encounter: Payer: Self-pay | Admitting: Internal Medicine

## 2021-07-17 NOTE — Assessment & Plan Note (Signed)
Gradually worsening and getting more difficult to redirect, cont exelon and follow

## 2021-07-17 NOTE — Assessment & Plan Note (Signed)
Pt to continue lower dose eliquis for now, but may need to reconsider pending further falls

## 2021-07-17 NOTE — Assessment & Plan Note (Signed)
Lab Results  Component Value Date   CREATININE 2.08 (H) 07/16/2021   Mild worsening, cont to avoid nephrotoxins, and renal dose meds, follow closely

## 2021-07-17 NOTE — Assessment & Plan Note (Signed)
Rate and volume stable, cont current med tx, and f/u cardiology as planned

## 2021-07-17 NOTE — Assessment & Plan Note (Signed)
Lab Results  Component Value Date   HGBA1C 6.2 07/16/2021   Stable, but with wt loss and ckd will need reduced dose glipizide ER 5 mg qd

## 2021-07-17 NOTE — Assessment & Plan Note (Signed)
Last vitamin D Lab Results  Component Value Date   VD25OH 42.26 07/16/2021   Stable, cont oral replacement

## 2021-07-18 ENCOUNTER — Encounter: Payer: Self-pay | Admitting: Internal Medicine

## 2021-07-26 ENCOUNTER — Ambulatory Visit: Payer: Medicare Other | Admitting: Dermatology

## 2021-07-28 ENCOUNTER — Other Ambulatory Visit: Payer: Self-pay | Admitting: Neurology

## 2021-08-23 ENCOUNTER — Other Ambulatory Visit: Payer: Self-pay

## 2021-08-23 MED ORDER — ESCITALOPRAM OXALATE 10 MG PO TABS: 10.0000 mg | ORAL_TABLET | Freq: Every day | ORAL | 3 refills | Status: DC

## 2021-08-27 ENCOUNTER — Other Ambulatory Visit: Payer: Self-pay | Admitting: Internal Medicine

## 2021-08-27 NOTE — Telephone Encounter (Signed)
Please refill as per office routine med refill policy (all routine meds to be refilled for 3 mo or monthly (per pt preference) up to one year from last visit, then month to month grace period for 3 mo, then further med refills will have to be denied) ? ?

## 2021-09-17 ENCOUNTER — Encounter: Payer: Self-pay | Admitting: Internal Medicine

## 2021-10-11 ENCOUNTER — Other Ambulatory Visit: Payer: Self-pay | Admitting: Internal Medicine

## 2021-10-11 NOTE — Telephone Encounter (Signed)
Please refill as per office routine med refill policy (all routine meds to be refilled for 3 mo or monthly (per pt preference) up to one year from last visit, then month to month grace period for 3 mo, then further med refills will have to be denied) ? ?

## 2021-10-12 ENCOUNTER — Other Ambulatory Visit: Payer: Self-pay

## 2021-10-13 MED ORDER — RIVASTIGMINE TARTRATE 1.5 MG PO CAPS: 1.5000 mg | ORAL_CAPSULE | Freq: Two times a day (BID) | ORAL | 0 refills | Status: AC

## 2021-10-18 ENCOUNTER — Encounter: Payer: Self-pay | Admitting: Internal Medicine

## 2021-10-20 MED ORDER — TRAZODONE HCL 50 MG PO TABS: 50.0000 mg | ORAL_TABLET | Freq: Every day | ORAL | 5 refills | Status: DC

## 2021-10-21 ENCOUNTER — Encounter: Payer: Self-pay | Admitting: Internal Medicine

## 2021-10-22 ENCOUNTER — Other Ambulatory Visit: Payer: Self-pay

## 2021-10-22 MED ORDER — ESCITALOPRAM OXALATE 10 MG PO TABS: 10.0000 mg | ORAL_TABLET | Freq: Every day | ORAL | 3 refills | Status: AC

## 2021-10-25 ENCOUNTER — Other Ambulatory Visit: Payer: Self-pay | Admitting: Gastroenterology

## 2021-10-26 NOTE — Telephone Encounter (Signed)
Jearld Fenton dropped off the forms. Requesting to be notified by a phone call once it's completed.   Best contact: [phone]

## 2021-10-27 NOTE — Telephone Encounter (Signed)
Sounds Good -- Lets reduce dose to 2.5 mg BID  Will need new Rx: Eliquis 2.5 mg PO BID; Disp # 180 tab, 3 refills. Stop taking 5 mg tabs.  [name] c

## 2021-10-27 NOTE — Telephone Encounter (Signed)
Needed to review with Dr harding. Medication list indicates patient is taking 2.5 mg eliquis at present time

## 2021-10-28 NOTE — Telephone Encounter (Signed)
[name], [facility]  You; Leonie Man, MD          Oh if he is already on correct dose than he should continue 2.5mg  BID and counsel caregivers to take extra precautions to limit falls      RN sent this message  Hello [name] , Dr Ellyn Hack and [facility] pharmacist reviewed and would like [name] to continue taking the Eliquis 2.5 mg twice a day .  Our records showed that you Hess is on this dose.  If not  let us know so we can send in the reduce dose. 5 mg table can not be spilt.   Just  take extra precaution in limiting falls     Thanks [employer] training [employer] education [employer]

## 2021-10-31 ENCOUNTER — Encounter: Payer: Self-pay | Admitting: Neurology

## 2021-11-02 ENCOUNTER — Telehealth: Payer: Self-pay | Admitting: Internal Medicine

## 2021-11-02 NOTE — Telephone Encounter (Signed)
..  Type of form received:  MOST Form  Additional comments: Patient's phone number:  [phone]  Received TA:[id number]  Form should be Faxed/mailed to: (address/ fax #)  n/a  Is patient requesting call for pickup: Yes - Patient is requesting call for pickup or notification on My Chart  Form placed:  In Dr. Cleta Alberts  Attach charge sheet.  Provider will determine charge.  Individual made aware of 3-5 business day turn around No?

## 2021-11-11 ENCOUNTER — Other Ambulatory Visit: Payer: Self-pay | Admitting: Neurology

## 2021-11-18 NOTE — Telephone Encounter (Signed)
[name]  - if they have the form and understand it and dont need an explanation, I can just sign after they fill it out.  thanks

## 2021-11-21 ENCOUNTER — Encounter: Payer: Self-pay | Admitting: Neurology

## 2021-11-24 ENCOUNTER — Encounter: Payer: Self-pay | Admitting: Internal Medicine

## 2021-12-04 ENCOUNTER — Emergency Department (HOSPITAL_COMMUNITY)
Admission: EM | Admit: 2021-12-04 | Discharge: 2021-12-04 | Disposition: A | Discharge status: Home / Self Care | Payer: Medicare Other | Attending: Emergency Medicine | Admitting: Emergency Medicine

## 2021-12-04 ENCOUNTER — Other Ambulatory Visit: Payer: Self-pay

## 2021-12-04 ENCOUNTER — Emergency Department (HOSPITAL_COMMUNITY): Payer: Medicare Other

## 2021-12-04 DIAGNOSIS — U071 COVID-19: Secondary | ICD-10-CM | POA: Diagnosis not present

## 2021-12-04 DIAGNOSIS — I130 Hypertensive heart and chronic kidney disease with heart failure and stage 1 through stage 4 chronic kidney disease, or unspecified chronic kidney disease: Secondary | ICD-10-CM | POA: Diagnosis not present

## 2021-12-04 DIAGNOSIS — R451 Restlessness and agitation: Secondary | ICD-10-CM | POA: Diagnosis not present

## 2021-12-04 DIAGNOSIS — Z96653 Presence of artificial knee joint, bilateral: Secondary | ICD-10-CM | POA: Diagnosis not present

## 2021-12-04 DIAGNOSIS — I5032 Chronic diastolic (congestive) heart failure: Secondary | ICD-10-CM | POA: Diagnosis not present

## 2021-12-04 DIAGNOSIS — I482 Chronic atrial fibrillation, unspecified: Secondary | ICD-10-CM | POA: Insufficient documentation

## 2021-12-04 DIAGNOSIS — Z7984 Long term (current) use of oral hypoglycemic drugs: Secondary | ICD-10-CM | POA: Diagnosis not present

## 2021-12-04 DIAGNOSIS — Z87891 Personal history of nicotine dependence: Secondary | ICD-10-CM | POA: Insufficient documentation

## 2021-12-04 DIAGNOSIS — I6200 Nontraumatic subdural hemorrhage, unspecified: Secondary | ICD-10-CM | POA: Insufficient documentation

## 2021-12-04 DIAGNOSIS — Z8616 Personal history of COVID-19: Secondary | ICD-10-CM | POA: Insufficient documentation

## 2021-12-04 DIAGNOSIS — Z743 Need for continuous supervision: Secondary | ICD-10-CM | POA: Diagnosis not present

## 2021-12-04 DIAGNOSIS — R404 Transient alteration of awareness: Secondary | ICD-10-CM | POA: Diagnosis not present

## 2021-12-04 DIAGNOSIS — N183 Chronic kidney disease, stage 3 unspecified: Secondary | ICD-10-CM | POA: Diagnosis not present

## 2021-12-04 DIAGNOSIS — R531 Weakness: Secondary | ICD-10-CM | POA: Diagnosis not present

## 2021-12-04 DIAGNOSIS — S065XAA Traumatic subdural hemorrhage with loss of consciousness status unknown, initial encounter: Secondary | ICD-10-CM

## 2021-12-04 DIAGNOSIS — E1122 Type 2 diabetes mellitus with diabetic chronic kidney disease: Secondary | ICD-10-CM | POA: Diagnosis not present

## 2021-12-04 DIAGNOSIS — R410 Disorientation, unspecified: Secondary | ICD-10-CM | POA: Diagnosis not present

## 2021-12-04 DIAGNOSIS — Z79899 Other long term (current) drug therapy: Secondary | ICD-10-CM | POA: Diagnosis not present

## 2021-12-04 DIAGNOSIS — R000 Tachycardia, unspecified: Secondary | ICD-10-CM | POA: Diagnosis not present

## 2021-12-04 DIAGNOSIS — E860 Dehydration: Secondary | ICD-10-CM | POA: Diagnosis not present

## 2021-12-04 DIAGNOSIS — R6889 Other general symptoms and signs: Secondary | ICD-10-CM | POA: Diagnosis not present

## 2021-12-04 DIAGNOSIS — R739 Hyperglycemia, unspecified: Secondary | ICD-10-CM | POA: Diagnosis not present

## 2021-12-04 DIAGNOSIS — F0390 Unspecified dementia without behavioral disturbance: Secondary | ICD-10-CM | POA: Insufficient documentation

## 2021-12-04 DIAGNOSIS — Z7901 Long term (current) use of anticoagulants: Secondary | ICD-10-CM | POA: Diagnosis not present

## 2021-12-04 DIAGNOSIS — S065X0A Traumatic subdural hemorrhage without loss of consciousness, initial encounter: Secondary | ICD-10-CM | POA: Diagnosis not present

## 2021-12-04 DIAGNOSIS — I499 Cardiac arrhythmia, unspecified: Secondary | ICD-10-CM | POA: Diagnosis not present

## 2021-12-04 LAB — RESP PANEL BY RT-PCR (FLU A&B, COVID) ARPGX2
Influenza A by PCR: NEGATIVE
Influenza A by PCR: NEGATIVE
Influenza B by PCR: NEGATIVE
Influenza B by PCR: NEGATIVE
SARS Coronavirus 2 by RT PCR: POSITIVE — AB
SARS Coronavirus 2 by RT PCR: POSITIVE — AB

## 2021-12-04 LAB — COMPREHENSIVE METABOLIC PANEL
ALT: 24 U/L (ref 0–44)
AST: 22 U/L (ref 15–41)
Albumin: 3.7 g/dL (ref 3.5–5.0)
Alkaline Phosphatase: 123 U/L (ref 38–126)
Anion gap: 10 (ref 5–15)
BUN: 32 mg/dL — ABNORMAL HIGH (ref 8–23)
CO2: 27 mmol/L (ref 22–32)
Calcium: 9.5 mg/dL (ref 8.9–10.3)
Chloride: 98 mmol/L (ref 98–111)
Creatinine, Ser: 2.14 mg/dL — ABNORMAL HIGH (ref 0.61–1.24)
GFR, Estimated: 30 mL/min — ABNORMAL LOW (ref >60)
Glucose, Bld: 306 mg/dL — ABNORMAL HIGH (ref 70–99)
Potassium: 4.1 mmol/L (ref 3.5–5.1)
Sodium: 135 mmol/L (ref 135–145)
Total Bilirubin: 1 mg/dL (ref 0.3–1.2)
Total Protein: 6.3 g/dL — ABNORMAL LOW (ref 6.5–8.1)

## 2021-12-04 LAB — CBC WITH DIFFERENTIAL/PLATELET
Abs Immature Granulocytes: 0.06 10*3/uL (ref 0.00–0.07)
Basophils Absolute: 0 10*3/uL (ref 0.0–0.1)
Basophils Relative: 0 %
Eosinophils Absolute: 0.1 10*3/uL (ref 0.0–0.5)
Eosinophils Relative: 1 %
HCT: 39.5 % (ref 39.0–52.0)
Hemoglobin: 13.2 g/dL (ref 13.0–17.0)
Immature Granulocytes: 1 %
Lymphocytes Relative: 5 %
Lymphs Abs: 0.4 10*3/uL — ABNORMAL LOW (ref 0.7–4.0)
MCH: 31.5 pg (ref 26.0–34.0)
MCHC: 33.4 g/dL (ref 30.0–36.0)
MCV: 94.3 fL (ref 80.0–100.0)
Monocytes Absolute: 1.1 10*3/uL — ABNORMAL HIGH (ref 0.1–1.0)
Monocytes Relative: 14 %
Neutro Abs: 6.5 10*3/uL (ref 1.7–7.7)
Neutrophils Relative %: 79 %
Platelets: 141 10*3/uL — ABNORMAL LOW (ref 150–400)
RBC: 4.19 MIL/uL — ABNORMAL LOW (ref 4.22–5.81)
RDW: 13.7 % (ref 11.5–15.5)
WBC: 8.2 10*3/uL (ref 4.0–10.5)
nRBC: 0 % (ref 0.0–0.2)

## 2021-12-04 LAB — URINALYSIS, MICROSCOPIC (REFLEX): Squamous Epithelial / HPF: NONE SEEN (ref 0–5)

## 2021-12-04 LAB — URINALYSIS, ROUTINE W REFLEX MICROSCOPIC
Bilirubin Urine: NEGATIVE
Glucose, UA: >=500 mg/dL — AB
Ketones, ur: NEGATIVE mg/dL
Leukocytes,Ua: NEGATIVE
Nitrite: NEGATIVE
Protein, ur: NEGATIVE mg/dL
Specific Gravity, Urine: 1.01 (ref 1.005–1.030)
pH: 6 (ref 5.0–8.0)

## 2021-12-04 LAB — CBG MONITORING, ED: Glucose-Capillary: 295 mg/dL — ABNORMAL HIGH (ref 70–99)

## 2021-12-04 MED ORDER — LORAZEPAM 2 MG/ML IJ SOLN
0.5000 mg | Freq: Once | INTRAMUSCULAR | Status: AC
  Administered 2021-12-04: 11:00:00 0.5 mg via INTRAVENOUS
  Filled 2021-12-04: qty 1

## 2021-12-04 MED ORDER — SODIUM CHLORIDE 0.9 % IV BOLUS
1000.0000 mL | Freq: Once | INTRAVENOUS | Status: AC
  Administered 2021-12-04: 10:00:00 1000 mL via INTRAVENOUS

## 2021-12-04 NOTE — ED Notes (Signed)
Pt's daughter wants to take pt home. RN and NT walked pt with walker, pt did moderately well. Daughter states this is similar to his baseline and she is comfortable taking him home as is. Pt's daughter called their ride.

## 2021-12-04 NOTE — ED Notes (Signed)
Pt's daughter verbalized understanding of d/c instructions, meds and followup care. Denies questions. No distress noted. W/C to exit with all belongings.

## 2021-12-04 NOTE — Discharge Instructions (Addendum)
Stop Eliquis

## 2021-12-04 NOTE — ED Triage Notes (Signed)
BIB [facility] from home with family. Family called d/t pt weak, recent change in trazodone dose 2 weeks ago, since then pt has had increased weakness and this AM had to be lowered to floor by family. Pt agitated and confused on arrival. EMS reports BP 90s/70s and irregular HR. BP improved on arrival.

## 2021-12-04 NOTE — ED Triage Notes (Addendum)
Error entry

## 2021-12-04 NOTE — Progress Notes (Signed)
Called regarding pt presenting to ED with several weeks progressive generalized weakness, has baseline dementia and on Eliquis for Afib. Per ED MD, pt has COVID. Has non-focal neurologic exam. CTH was personally reviewed demonstrating significant overall atrophy and right convexity primarily chronic SDH with minimal local mass effect and no MLS or HCP.  I don't think he needs operative evacuation. Would stop Eliquis now, can start a short course of steroids and I would plan on following this SDH in the outpatient clinic with repeat CTH in 3-4 weeks.  Consuella Lose, MD [facility]

## 2021-12-04 NOTE — ED Notes (Signed)
Pt family stated he had breakfast
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
## 2021-12-04 NOTE — ED Notes (Signed)
Pt's daughter refusing additional labs to be drawn at this time. Dr Gilford Raid aware.

## 2021-12-04 NOTE — ED Notes (Signed)
Pt's daughter adamantly requesting new covid swab, states she does not believe her father has covid. Dr Gilford Raid aware.

## 2021-12-05 ENCOUNTER — Encounter: Payer: Self-pay | Admitting: Internal Medicine

## 2021-12-05 DIAGNOSIS — F03C0 Unspecified dementia, severe, without behavioral disturbance, psychotic disturbance, mood disturbance, and anxiety: Secondary | ICD-10-CM

## 2021-12-05 DIAGNOSIS — I4891 Unspecified atrial fibrillation: Secondary | ICD-10-CM

## 2021-12-05 DIAGNOSIS — R413 Other amnesia: Secondary | ICD-10-CM

## 2021-12-07 ENCOUNTER — Encounter: Payer: Self-pay | Admitting: Internal Medicine

## 2021-12-07 ENCOUNTER — Telehealth (INDEPENDENT_AMBULATORY_CARE_PROVIDER_SITE_OTHER): Payer: Medicare Other | Admitting: Family Medicine

## 2021-12-07 ENCOUNTER — Telehealth: Payer: Self-pay

## 2021-12-07 DIAGNOSIS — U071 COVID-19: Secondary | ICD-10-CM | POA: Diagnosis not present

## 2021-12-07 MED ORDER — BENZONATATE 100 MG PO CAPS: 100.0000 mg | ORAL_CAPSULE | Freq: Three times a day (TID) | ORAL | 0 refills | Status: AC | PRN

## 2021-12-07 MED ORDER — MOLNUPIRAVIR EUA 200MG CAPSULE: 4.0000 | ORAL_CAPSULE | Freq: Two times a day (BID) | ORAL | 0 refills | Status: AC
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
## 2021-12-07 NOTE — Patient Instructions (Addendum)
°HOME CARE TIPS: ° ° °-I sent the medication(s) we discussed to your pharmacy: °Meds ordered this encounter  °Medications  ° molnupiravir EUA (LAGEVRIO) 200 mg CAPS capsule  °  Sig: Take 4 capsules (800 mg total) by mouth 2 (two) times daily for 5 days.  °  Dispense:  40 capsule  °  Refill:  0  ° benzonatate (TESSALON PERLES) 100 MG capsule  °  Sig: Take 1 capsule (100 mg total) by mouth 3 (three) times daily as needed.  °  Dispense:  20 capsule  °  Refill:  0  °  ° °-I sent in the Covid19 treatment or referral you requested per our discussion. Please see the information provided below and discuss further with the pharmacist/treatment team.  ° ° °-there is a chance of rebound illness after finishing your treatment. If you become sick again please isolate for an additional 5 days, plus 5 more days of masking.  ° °-can use nasal saline a few times per day if you have nasal congestion ° °-stay hydrated, drink plenty of fluids and eat small healthy meals - avoid dairy ° °-follow up with your doctor in 2-3 days unless improving and feeling better ° °-stay home while sick, except to seek medical care. If you have COVID19, you will likely be contagious for 7-10 days. Flu or Influenza is likely contagious for about 7 days. Other respiratory viral infections remain contagious for 5-10+ days depending on the virus and many other factors. Wear a good mask that fits snugly (such as N95 or KN95) if around others to reduce the risk of transmission. ° °It was nice to meet you today, and I really hope you are feeling better soon. I help [facility] out with telemedicine visits on Tuesdays and Thursdays and am happy to help if you need a follow up virtual visit on those days. Otherwise, if you have any concerns or questions following this visit please schedule a follow up visit with your Primary Care doctor or seek care at a local urgent care clinic to avoid delays in care.  ° ° °Seek in person care or schedule a follow up video  visit promptly if your symptoms worsen, new concerns arise or you are not improving with treatment. Call 911 and/or seek emergency care if your symptoms are severe or life threatening. ° °  °Fact Sheet for Patients And Caregivers °Emergency Use Authorization (EUA) Of LAGEVRIO™ (molnupiravir) capsules For °Coronavirus Disease 2019 (COVID-19) ° °What is the most important information I should know about LAGEVRIO? °LAGEVRIO may cause serious side effects, including: °? LAGEVRIO may cause harm to your unborn baby. It is not known if LAGEVRIO will °harm your baby if you take LAGEVRIO during pregnancy. °o LAGEVRIO is not recommended for use in pregnancy. °o LAGEVRIO has not been studied in pregnancy. LAGEVRIO was studied in pregnant °animals only. When LAGEVRIO was given to pregnant animals, LAGEVRIO caused °harm to their unborn babies. °o You and your healthcare provider may decide that you should take LAGEVRIO during °pregnancy if there are no other COVID-19 treatment options approved or authorized by °the FDA that are accessible or clinically appropriate for you. °o If you and your healthcare provider decide that you should take LAGEVRIO during °pregnancy, you and your healthcare provider should discuss the known and potential °benefits and the potential risks of taking LAGEVRIO during pregnancy. °For individuals who are able to become pregnant: °? You should use a reliable method of birth control (contraception) consistently and correctly °  during treatment with LAGEVRIO and for 4 days after the last dose of LAGEVRIO. Talk to °your healthcare provider about reliable birth control methods. °? Before starting treatment with LAGEVRIO your healthcare provider may do a pregnancy test °to see if you are pregnant before starting treatment with LAGEVRIO. °? Tell your healthcare provider right away if you become pregnant or think you may be °pregnant during treatment with LAGEVRIO. °Pregnancy Surveillance Program: °? There is a  pregnancy surveillance program for individuals who take LAGEVRIO during °pregnancy. The purpose of this program is to collect information about the health of you and °your baby. Talk to your healthcare provider about how to take part in this program. °? If you take LAGEVRIO during pregnancy and you agree to participate in the pregnancy °surveillance program and allow your healthcare provider to share your information with °[employer], then your healthcare provider will report your use of LAGEVRIO °during pregnancy to [employer] by calling [phone] or °[email or web address]. °For individuals who are sexually active with partners who are able to become pregnant: °? It is not known if LAGEVRIO can affect sperm. While the risk is regarded as low, animal °studies to fully assess the potential for LAGEVRIO to affect the babies of males treated with °LAGEVRIO have not been completed. A reliable method of birth control (contraception) °should be used consistently and correctly during treatment with LAGEVRIO and for at least °3 months after the last dose. The risk to sperm beyond 3 months is not known. Studies to °understand the risk to sperm beyond 3 months are ongoing. Talk to your healthcare provider °about reliable birth control methods. Talk to your healthcare provider if you have questions °or concerns about how LAGEVRIO may affect sperm. °You are being given this fact sheet because your healthcare provider believes it is necessary to °provide you with LAGEVRIO for the treatment of adults with mild-to-moderate coronavirus °disease 2019 (COVID-19) with positive results of direct SARS-CoV-2 viral testing, and °who are at high risk for progression to severe COVID-19 including hospitalization or death, and °for whom other COVID-19 treatment options approved or authorized by the FDA are not °accessible or clinically appropriate. °The U.S. Food and Drug Administration (FDA) has issued an  Emergency Use Authorization °(EUA) to make LAGEVRIO available during the COVID-19 pandemic (for more details about an °EUA please see “What is an Emergency Use Authorization?” at the end of this document). °LAGEVRIO is not an FDA-approved medicine in the [place]. Read this Fact Sheet for °information about LAGEVRIO. Talk to your healthcare provider about your options if you have °any questions. It is your choice to take LAGEVRIO. ° °What is COVID-19? °COVID-19 is caused by a virus called a coronavirus. You can get COVID-19 through close °contact with another person who has the virus. °COVID-19 illnesses have ranged from very mild-to-severe, including illness resulting in death. °While information so far suggests that most COVID-19 illness is mild, serious illness can °happen and may cause some of your other medical conditions to become worse. Older people °and people of all ages with severe, long lasting (chronic) medical conditions like heart disease, °lung disease and diabetes, for example seem to be at higher risk of being hospitalized for °COVID-19. ° °What is LAGEVRIO? °LAGEVRIO is an investigational medicine used to treat mild-to-moderate COVID-19 in adults: °? with positive results of direct SARS-CoV-2 viral testing, and °? who are at high risk for progression to severe COVID-19 including hospitalization or death, °and for whom other   other COVID-19 treatment options approved or authorized by the FDA are not accessible or clinically appropriate. The FDA has authorized the emergency use of LAGEVRIO for the treatment of mild-tomoderate COVID-19 in adults under an EUA. For more information on EUA, see the What is an Emergency Use Authorization (EUA)? section at the end of this Fact Sheet. LAGEVRIO is not authorized: ? for use in people less than 66 years of age. ? for prevention of COVID-19. ? for people needing hospitalization for COVID-19. ? for use for longer than 5 consecutive days.  What should I  tell my healthcare provider before I take LAGEVRIO? Tell your healthcare provider if you: ? Have any allergies ? Are breastfeeding or plan to breastfeed ? Have any serious illnesses ? Are taking any medicines (prescription, over-the-counter, vitamins, or herbal products).  How do I take LAGEVRIO? ? Take LAGEVRIO exactly as your healthcare provider tells you to take it. ? Take 4 capsules of LAGEVRIO every 12 hours (for example, at 8 am and at 8 pm) ? Take LAGEVRIO for 5 days. It is important that you complete the full 5 days of treatment with LAGEVRIO. Do not stop taking LAGEVRIO before you complete the full 5 days of treatment, even if you feel better. ? Take LAGEVRIO with or without food. ? You should stay in isolation for as long as your healthcare provider tells you to. Talk to your healthcare provider if you are not sure about how to properly isolate while you have COVID-19. ? Swallow LAGEVRIO capsules whole. Do not open, break, or crush the capsules. If you cannot swallow capsules whole, tell your healthcare provider. ? What to do if you miss a dose: o If it has been less than 10 hours since the missed dose, take it as soon as you remember o If it has been more than 10 hours since the missed dose, skip the missed dose and take your dose at the next scheduled time. ? Do not double the dose of LAGEVRIO to make up for a missed dose.  What are the important possible side effects of LAGEVRIO? ? See, What is the most important information I should know about LAGEVRIO? ? Allergic Reactions. Allergic reactions can happen in people taking LAGEVRIO, even after only 1 dose. Stop taking LAGEVRIO and call your healthcare provider right away if you get any of the following symptoms of an allergic reaction: o hives o rapid heartbeat o trouble swallowing or breathing o swelling of the mouth, lips, or face o throat tightness o hoarseness o skin rash The most common side effects of  LAGEVRIO are: ? diarrhea ? nausea ? dizziness These are not all the possible side effects of LAGEVRIO. Not many people have taken LAGEVRIO. Serious and unexpected side effects may happen. This medicine is still being studied, so it is possible that all of the risks are not known at this time.  What other treatment choices are there?  Veklury (remdesivir) is FDA-approved as an intravenous (IV) infusion for the treatment of mildto-moderate WSFKC-12 in certain adults and children. Talk with your doctor to see if Marijean Heath is appropriate for you. Like LAGEVRIO, FDA may also allow for the emergency use of other medicines to treat people with COVID-19. Go to LacrosseProperties.si for more information. It is your choice to be treated or not to be treated with LAGEVRIO. Should you decide not to take it, it will not change your standard medical care.  What if I am breastfeeding? Breastfeeding is not recommended  during treatment with LAGEVRIO and for 4 days after the last dose of LAGEVRIO. If you are breastfeeding or plan to breastfeed, talk to your healthcare provider about your options and specific situation before taking LAGEVRIO.  How do I report side effects with LAGEVRIO? Contact your healthcare provider if you have any side effects that bother you or do not go away. Report side effects to FDA MedWatch at SmoothHits.hu or call 1-800-FDA-1088 (1- 606 357 9088).  How should I store [place]? ? Store LAGEVRIO capsules at room temperature between 39F to 59F (20C to 25C). ? Keep LAGEVRIO and all medicines out of the reach of children and pets. How can I learn more about COVID-19? ? Ask your healthcare provider. ? Visit SeekRooms.co.uk ? Contact your local or state public health department. ? Call [place] at [phone]) [phone] (toll free in the [place] ? Visit  www.molnupiravir.com  What Is an Emergency Use Authorization (EUA)? The [place] FDA has made [place] available under an emergency access mechanism called an Emergency Use Authorization (EUA) The EUA is supported by a Presenter, broadcasting Health and Human Service (HHS) declaration that circumstances exist to justify emergency use of drugs and biological products during the COVID-19 pandemic. LAGEVRIO for the treatment of mild-to-moderate COVID-19 in adults with positive results of direct SARS-CoV-2 viral testing, who are at high risk for progression to severe COVID-19, including hospitalization or death, and for whom alternative COVID-19 treatment options approved or authorized by FDA are not accessible or clinically appropriate, has not undergone the same type of review as an FDA-approved product. In issuing an EUA under the EBRAX-09 public health emergency, the FDA has determined, among other things, that based on the total amount of scientific evidence available including data from adequate and well-controlled clinical trials, if available, it is reasonable to believe that the product may be effective for diagnosing, treating, or preventing COVID-19, or a serious or life-threatening disease or condition caused by COVID-19; that the known and potential benefits of the product, when used to diagnose, treat, or prevent such disease or condition, outweigh the known and potential risks of such product; and that there are no adequate, approved, and available alternatives.  All of these criteria must be met to allow for the product to be used in the treatment of patients during the COVID-19 pandemic. The EUA for LAGEVRIO is in effect for the duration of the COVID-19 declaration justifying emergency use of LAGEVRIO, unless terminated or revoked (after which LAGEVRIO may no longer be used under the EUA). For patent information: http://rogers.info/ Copyright  2021-2022 [employer]  [place], [place] and its affiliates. All rights reserved. [id number] Revised: March 2022

## 2021-12-07 NOTE — Telephone Encounter (Signed)
Covid Positive Saturday  Cough, low grade fever, runny nose, congestion, and overall weakness.

## 2021-12-07 NOTE — Telephone Encounter (Signed)
Spoke with patients wife and daughter Lattie Haw. Patient tested positive Saturday and is experiencing, weakness, cough, runny nose, congestion.    Virtual appt has been scheduled for today at 5:20

## 2021-12-08 NOTE — Telephone Encounter (Signed)
[name] w/ [facility] states family is requesting for provider to be patient's hospice attending provider.  Please advise  Phone [phone]

## 2021-12-09 NOTE — Telephone Encounter (Signed)
[facility] notified.  

## 2021-12-09 NOTE — Telephone Encounter (Signed)
Yes, ok to be attending, but also ok for hospice MD to provide symptomatic medications

## 2022-01-11 ENCOUNTER — Other Ambulatory Visit: Payer: Self-pay | Admitting: Neurology

## 2022-01-12 DEATH — deceased

## 2022-02-01 ENCOUNTER — Ambulatory Visit: Payer: Medicare Other | Admitting: Neurology
